# Patient Record
Sex: Female | Born: 1985 | Race: Black or African American | Hispanic: No | Marital: Single | Smoking: Former smoker
Health system: Southern US, Community
[De-identification: ages and names within clinical notes are randomized; demographics above are authoritative.]

## PROBLEM LIST (undated history)

## (undated) DIAGNOSIS — R002 Palpitations: Secondary | ICD-10-CM

## (undated) DIAGNOSIS — D649 Anemia, unspecified: Secondary | ICD-10-CM

## (undated) DIAGNOSIS — I10 Essential (primary) hypertension: Secondary | ICD-10-CM

## (undated) DIAGNOSIS — Z9289 Personal history of other medical treatment: Secondary | ICD-10-CM

## (undated) DIAGNOSIS — J40 Bronchitis, not specified as acute or chronic: Secondary | ICD-10-CM

## (undated) HISTORY — DX: Personal history of other medical treatment: Z92.89

## (undated) HISTORY — DX: Palpitations: R00.2

---

## 2008-07-09 ENCOUNTER — Emergency Department (HOSPITAL_COMMUNITY): Admission: EM | Admit: 2008-07-09 | Discharge: 2008-07-09 | Payer: Self-pay | Admitting: Emergency Medicine

## 2008-10-15 ENCOUNTER — Emergency Department (HOSPITAL_BASED_OUTPATIENT_CLINIC_OR_DEPARTMENT_OTHER): Admission: EM | Admit: 2008-10-15 | Discharge: 2008-10-15 | Payer: Self-pay | Admitting: Emergency Medicine

## 2008-12-11 ENCOUNTER — Emergency Department (HOSPITAL_BASED_OUTPATIENT_CLINIC_OR_DEPARTMENT_OTHER): Admission: EM | Admit: 2008-12-11 | Discharge: 2008-12-11 | Payer: Self-pay | Admitting: Emergency Medicine

## 2008-12-11 ENCOUNTER — Ambulatory Visit: Payer: Self-pay | Admitting: Diagnostic Radiology

## 2009-01-10 ENCOUNTER — Emergency Department (HOSPITAL_BASED_OUTPATIENT_CLINIC_OR_DEPARTMENT_OTHER): Admission: EM | Admit: 2009-01-10 | Discharge: 2009-01-10 | Payer: Self-pay | Admitting: Emergency Medicine

## 2009-03-25 ENCOUNTER — Emergency Department (HOSPITAL_BASED_OUTPATIENT_CLINIC_OR_DEPARTMENT_OTHER): Admission: EM | Admit: 2009-03-25 | Discharge: 2009-03-25 | Payer: Self-pay | Admitting: Emergency Medicine

## 2009-05-11 ENCOUNTER — Emergency Department (HOSPITAL_BASED_OUTPATIENT_CLINIC_OR_DEPARTMENT_OTHER): Admission: EM | Admit: 2009-05-11 | Discharge: 2009-05-11 | Payer: Self-pay | Admitting: Emergency Medicine

## 2009-09-17 ENCOUNTER — Emergency Department (HOSPITAL_BASED_OUTPATIENT_CLINIC_OR_DEPARTMENT_OTHER): Admission: EM | Admit: 2009-09-17 | Discharge: 2009-09-17 | Payer: Self-pay | Admitting: Emergency Medicine

## 2010-08-02 LAB — URINE MICROSCOPIC-ADD ON

## 2010-08-02 LAB — URINALYSIS, ROUTINE W REFLEX MICROSCOPIC
Hgb urine dipstick: NEGATIVE
Nitrite: NEGATIVE
Protein, ur: NEGATIVE mg/dL
Specific Gravity, Urine: 1.029 (ref 1.005–1.030)
Urobilinogen, UA: 0.2 mg/dL (ref 0.0–1.0)

## 2010-08-02 LAB — GC/CHLAMYDIA PROBE AMP, GENITAL: Chlamydia, DNA Probe: NEGATIVE

## 2010-08-02 LAB — WET PREP, GENITAL: Trich, Wet Prep: NONE SEEN

## 2010-08-02 LAB — HERPES SIMPLEX VIRUS CULTURE

## 2010-08-15 LAB — URINE MICROSCOPIC-ADD ON

## 2010-08-15 LAB — URINALYSIS, ROUTINE W REFLEX MICROSCOPIC
Bilirubin Urine: NEGATIVE
Glucose, UA: NEGATIVE mg/dL
Ketones, ur: NEGATIVE mg/dL
Protein, ur: NEGATIVE mg/dL
Urobilinogen, UA: 0.2 mg/dL (ref 0.0–1.0)

## 2010-08-17 LAB — GC/CHLAMYDIA PROBE AMP, GENITAL: Chlamydia, DNA Probe: NEGATIVE

## 2010-08-17 LAB — URINALYSIS, ROUTINE W REFLEX MICROSCOPIC
Bilirubin Urine: NEGATIVE
Nitrite: NEGATIVE
Specific Gravity, Urine: 1.027 (ref 1.005–1.030)
Urobilinogen, UA: 0.2 mg/dL (ref 0.0–1.0)
pH: 5.5 (ref 5.0–8.0)

## 2010-08-17 LAB — URINE CULTURE

## 2010-08-17 LAB — URINE MICROSCOPIC-ADD ON

## 2010-08-17 LAB — WET PREP, GENITAL: Trich, Wet Prep: NONE SEEN

## 2010-08-17 LAB — RPR: RPR Ser Ql: NONREACTIVE

## 2010-08-17 LAB — PREGNANCY, URINE: Preg Test, Ur: NEGATIVE

## 2010-08-20 LAB — DIFFERENTIAL
Basophils Absolute: 0.1 10*3/uL (ref 0.0–0.1)
Eosinophils Absolute: 0.1 10*3/uL (ref 0.0–0.7)
Lymphocytes Relative: 27 % (ref 12–46)
Neutro Abs: 4.5 10*3/uL (ref 1.7–7.7)
Neutrophils Relative %: 65 % (ref 43–77)

## 2010-08-20 LAB — URINALYSIS, ROUTINE W REFLEX MICROSCOPIC
Glucose, UA: NEGATIVE mg/dL
Ketones, ur: NEGATIVE mg/dL
Protein, ur: NEGATIVE mg/dL
Urobilinogen, UA: 0.2 mg/dL (ref 0.0–1.0)

## 2010-08-20 LAB — PREGNANCY, URINE: Preg Test, Ur: NEGATIVE

## 2010-08-20 LAB — URINE CULTURE

## 2010-08-20 LAB — COMPREHENSIVE METABOLIC PANEL
BUN: 13 mg/dL (ref 6–23)
CO2: 24 mEq/L (ref 19–32)
Calcium: 9.3 mg/dL (ref 8.4–10.5)
Chloride: 109 mEq/L (ref 96–112)
Creatinine, Ser: 0.8 mg/dL (ref 0.4–1.2)
GFR calc non Af Amer: >60 mL/min (ref >60)
Total Bilirubin: 0.3 mg/dL (ref 0.3–1.2)

## 2010-08-20 LAB — CBC
HCT: 27.6 % — ABNORMAL LOW (ref 36.0–46.0)
MCHC: 30.1 g/dL (ref 30.0–36.0)
MCV: 62.7 fL — ABNORMAL LOW (ref 78.0–100.0)
RBC: 4.4 MIL/uL (ref 3.87–5.11)
WBC: 7 10*3/uL (ref 4.0–10.5)

## 2010-08-20 LAB — URINE MICROSCOPIC-ADD ON

## 2010-08-20 LAB — D-DIMER, QUANTITATIVE: D-Dimer, Quant: <0.22 ug/mL-FEU (ref 0.00–0.48)

## 2010-08-21 LAB — DIFFERENTIAL
Basophils Absolute: 0 10*3/uL (ref 0.0–0.1)
Basophils Absolute: 0.1 10*3/uL (ref 0.0–0.1)
Basophils Relative: 0 % (ref 0–1)
Basophils Relative: 1 % (ref 0–1)
Eosinophils Relative: 1 % (ref 0–5)
Lymphocytes Relative: 13 % (ref 12–46)
Monocytes Absolute: 0.5 10*3/uL (ref 0.1–1.0)
Monocytes Relative: 4 % (ref 3–12)
Neutro Abs: 10.4 10*3/uL — ABNORMAL HIGH (ref 1.7–7.7)
Neutro Abs: 6.7 10*3/uL (ref 1.7–7.7)

## 2010-08-21 LAB — CBC
HCT: 29.7 % — ABNORMAL LOW (ref 36.0–46.0)
Hemoglobin: 8.6 g/dL — ABNORMAL LOW (ref 12.0–15.0)
MCHC: 30.2 g/dL (ref 30.0–36.0)
MCV: 63.4 fL — ABNORMAL LOW (ref 78.0–100.0)
Platelets: 286 10*3/uL (ref 150–400)
RDW: 15.8 % — ABNORMAL HIGH (ref 11.5–15.5)
RDW: 16 % — ABNORMAL HIGH (ref 11.5–15.5)

## 2010-08-21 LAB — URINALYSIS, ROUTINE W REFLEX MICROSCOPIC
Nitrite: NEGATIVE
Specific Gravity, Urine: 1.024 (ref 1.005–1.030)
Urobilinogen, UA: 0.2 mg/dL (ref 0.0–1.0)
pH: 5.5 (ref 5.0–8.0)

## 2010-08-21 LAB — URINE MICROSCOPIC-ADD ON

## 2010-08-21 LAB — COMPREHENSIVE METABOLIC PANEL
Albumin: 4.1 g/dL (ref 3.5–5.2)
BUN: 13 mg/dL (ref 6–23)
Creatinine, Ser: 0.8 mg/dL (ref 0.4–1.2)
Total Protein: 7.1 g/dL (ref 6.0–8.3)

## 2010-08-21 LAB — URINE CULTURE

## 2010-09-20 ENCOUNTER — Emergency Department (HOSPITAL_BASED_OUTPATIENT_CLINIC_OR_DEPARTMENT_OTHER)
Admission: EM | Admit: 2010-09-20 | Discharge: 2010-09-20 | Disposition: A | Discharge status: Home / Self Care | Payer: Self-pay | Attending: Emergency Medicine | Admitting: Emergency Medicine

## 2010-09-20 DIAGNOSIS — K089 Disorder of teeth and supporting structures, unspecified: Secondary | ICD-10-CM | POA: Insufficient documentation

## 2010-09-20 DIAGNOSIS — F17200 Nicotine dependence, unspecified, uncomplicated: Secondary | ICD-10-CM | POA: Insufficient documentation

## 2010-10-04 ENCOUNTER — Emergency Department (HOSPITAL_BASED_OUTPATIENT_CLINIC_OR_DEPARTMENT_OTHER)
Admission: EM | Admit: 2010-10-04 | Discharge: 2010-10-04 | Disposition: A | Discharge status: Home / Self Care | Payer: Self-pay | Attending: Emergency Medicine | Admitting: Emergency Medicine

## 2010-10-04 DIAGNOSIS — J069 Acute upper respiratory infection, unspecified: Secondary | ICD-10-CM | POA: Insufficient documentation

## 2010-10-04 DIAGNOSIS — F17200 Nicotine dependence, unspecified, uncomplicated: Secondary | ICD-10-CM | POA: Insufficient documentation

## 2010-10-04 DIAGNOSIS — N898 Other specified noninflammatory disorders of vagina: Secondary | ICD-10-CM | POA: Insufficient documentation

## 2010-10-04 DIAGNOSIS — J40 Bronchitis, not specified as acute or chronic: Secondary | ICD-10-CM | POA: Insufficient documentation

## 2010-10-04 LAB — URINALYSIS, ROUTINE W REFLEX MICROSCOPIC
Bilirubin Urine: NEGATIVE
Ketones, ur: NEGATIVE mg/dL
Nitrite: NEGATIVE
Protein, ur: NEGATIVE mg/dL
Specific Gravity, Urine: 1.03 (ref 1.005–1.030)
Urobilinogen, UA: 0.2 mg/dL (ref 0.0–1.0)

## 2010-10-12 ENCOUNTER — Emergency Department (HOSPITAL_BASED_OUTPATIENT_CLINIC_OR_DEPARTMENT_OTHER)
Admission: EM | Admit: 2010-10-12 | Discharge: 2010-10-12 | Disposition: A | Discharge status: Home / Self Care | Payer: Self-pay | Attending: Emergency Medicine | Admitting: Emergency Medicine

## 2010-10-12 DIAGNOSIS — D649 Anemia, unspecified: Secondary | ICD-10-CM | POA: Insufficient documentation

## 2010-10-12 DIAGNOSIS — M7989 Other specified soft tissue disorders: Secondary | ICD-10-CM | POA: Insufficient documentation

## 2010-10-12 LAB — BASIC METABOLIC PANEL
BUN: 9 mg/dL (ref 6–23)
Calcium: 8.7 mg/dL (ref 8.4–10.5)
Creatinine, Ser: 0.6 mg/dL (ref 0.4–1.2)
GFR calc Af Amer: >60 mL/min (ref >60)

## 2010-10-12 LAB — DIFFERENTIAL
Basophils Relative: 1 % (ref 0–1)
Eosinophils Absolute: 0.3 10*3/uL (ref 0.0–0.7)
Lymphs Abs: 2.9 10*3/uL (ref 0.7–4.0)
Monocytes Absolute: 0.6 10*3/uL (ref 0.1–1.0)
Neutro Abs: 4.5 10*3/uL (ref 1.7–7.7)
Neutrophils Relative %: 55 % (ref 43–77)

## 2010-10-12 LAB — CBC
MCH: 21.4 pg — ABNORMAL LOW (ref 26.0–34.0)
MCHC: 30.9 g/dL (ref 30.0–36.0)
Platelets: 326 10*3/uL (ref 150–400)
RDW: 17.2 % — ABNORMAL HIGH (ref 11.5–15.5)

## 2010-10-14 ENCOUNTER — Emergency Department (INDEPENDENT_AMBULATORY_CARE_PROVIDER_SITE_OTHER): Payer: Self-pay

## 2010-10-14 ENCOUNTER — Emergency Department (HOSPITAL_BASED_OUTPATIENT_CLINIC_OR_DEPARTMENT_OTHER)
Admission: EM | Admit: 2010-10-14 | Discharge: 2010-10-14 | Disposition: A | Discharge status: Home / Self Care | Payer: Self-pay | Attending: Emergency Medicine | Admitting: Emergency Medicine

## 2010-10-14 DIAGNOSIS — X58XXXA Exposure to other specified factors, initial encounter: Secondary | ICD-10-CM

## 2010-10-14 DIAGNOSIS — H5789 Other specified disorders of eye and adnexa: Secondary | ICD-10-CM

## 2010-10-14 DIAGNOSIS — H5710 Ocular pain, unspecified eye: Secondary | ICD-10-CM

## 2010-10-14 DIAGNOSIS — S0003XA Contusion of scalp, initial encounter: Secondary | ICD-10-CM | POA: Insufficient documentation

## 2010-12-09 ENCOUNTER — Encounter: Payer: Self-pay | Admitting: *Deleted

## 2010-12-09 ENCOUNTER — Emergency Department (INDEPENDENT_AMBULATORY_CARE_PROVIDER_SITE_OTHER): Payer: Self-pay

## 2010-12-09 ENCOUNTER — Emergency Department (HOSPITAL_BASED_OUTPATIENT_CLINIC_OR_DEPARTMENT_OTHER)
Admission: EM | Admit: 2010-12-09 | Discharge: 2010-12-09 | Disposition: A | Discharge status: Home / Self Care | Payer: Self-pay | Attending: Emergency Medicine | Admitting: Emergency Medicine

## 2010-12-09 DIAGNOSIS — R079 Chest pain, unspecified: Secondary | ICD-10-CM | POA: Insufficient documentation

## 2010-12-09 DIAGNOSIS — R109 Unspecified abdominal pain: Secondary | ICD-10-CM | POA: Insufficient documentation

## 2010-12-09 DIAGNOSIS — N390 Urinary tract infection, site not specified: Secondary | ICD-10-CM | POA: Insufficient documentation

## 2010-12-09 DIAGNOSIS — F17200 Nicotine dependence, unspecified, uncomplicated: Secondary | ICD-10-CM | POA: Insufficient documentation

## 2010-12-09 DIAGNOSIS — M940 Chondrocostal junction syndrome [Tietze]: Secondary | ICD-10-CM | POA: Insufficient documentation

## 2010-12-09 LAB — COMPREHENSIVE METABOLIC PANEL
ALT: 21 U/L (ref 0–35)
Albumin: 3.9 g/dL (ref 3.5–5.2)
Alkaline Phosphatase: 52 U/L (ref 39–117)
Calcium: 9.5 mg/dL (ref 8.4–10.5)
Potassium: 4.1 mEq/L (ref 3.5–5.1)
Sodium: 140 mEq/L (ref 135–145)
Total Protein: 6.4 g/dL (ref 6.0–8.3)

## 2010-12-09 LAB — URINALYSIS, ROUTINE W REFLEX MICROSCOPIC
Glucose, UA: NEGATIVE mg/dL
Ketones, ur: 15 mg/dL — AB
Nitrite: NEGATIVE
Specific Gravity, Urine: 1.035 — ABNORMAL HIGH (ref 1.005–1.030)
pH: 5 (ref 5.0–8.0)

## 2010-12-09 LAB — DIFFERENTIAL
Basophils Relative: 0 % (ref 0–1)
Lymphs Abs: 0.8 10*3/uL (ref 0.7–4.0)
Monocytes Absolute: 0.4 10*3/uL (ref 0.1–1.0)
Monocytes Relative: 5 % (ref 3–12)
Neutro Abs: 6.2 10*3/uL (ref 1.7–7.7)
Neutrophils Relative %: 79 % — ABNORMAL HIGH (ref 43–77)

## 2010-12-09 LAB — LIPASE, BLOOD: Lipase: 11 U/L (ref 11–59)

## 2010-12-09 LAB — URINE MICROSCOPIC-ADD ON

## 2010-12-09 LAB — CBC
HCT: 35.6 % — ABNORMAL LOW (ref 36.0–46.0)
Hemoglobin: 11.3 g/dL — ABNORMAL LOW (ref 12.0–15.0)
MCHC: 31.7 g/dL (ref 30.0–36.0)
RBC: 4.93 MIL/uL (ref 3.87–5.11)

## 2010-12-09 MED ORDER — NAPROXEN 250 MG PO TABS
500.0000 mg | ORAL_TABLET | Freq: Once | ORAL | Status: AC
Start: 2010-12-09 — End: 2010-12-09
  Administered 2010-12-09: 500 mg via ORAL
  Filled 2010-12-09 (×2): qty 2

## 2010-12-09 MED ORDER — NAPROXEN 500 MG PO TABS: 500.0000 mg | ORAL_TABLET | Freq: Two times a day (BID) | ORAL | Status: DC

## 2010-12-09 MED ORDER — NITROFURANTOIN MONOHYD MACRO 100 MG PO CAPS: 100.0000 mg | ORAL_CAPSULE | Freq: Two times a day (BID) | ORAL | Status: AC

## 2010-12-09 NOTE — ED Notes (Signed)
Pt amb to room 6 with quick steady gait in nad.  Pt reports mid chest "tight" x this am, also "I think I have a uti..."  Pt denies any sob or other c/o.

## 2010-12-09 NOTE — ED Provider Notes (Signed)
History     Chief Complaint  Patient presents with  . Chest Pain  . Abdominal Pain   HPI Comments: Presents with multiple complaints. First complaint is substernal chest tightness which began yesterday. The pain has been constant it is pleuritic in nature. It is also worse with palpation. Not worse with exertion. Has no cardiac history history of pulmonary embolus. She is a smoker but denies oral contraceptive use, recent travel, recent surgery. There is mild associated shortness of breath secondary to the pain. Has no associated diaphoresis, nausea, vomiting. She also states she has had generalized abdominal pain for the past several days CT with suprapubic tenderness and urinary symptoms. She describes her urinary symptoms of dysuria, frequency, urgency. Symptoms have not been worse. She is to take an antibiotic for an abscess. No fever, chills.  Patient is a 25 y.o. female presenting with chest pain. The history is provided by the patient. No language interpreter was used.  Chest Pain The chest pain began yesterday. Episode Length: has been constant since onset. Chest pain occurs constantly. The chest pain is worsening. The pain is associated with breathing (and palpation). The severity of the pain is mild. The quality of the pain is described as tightness and pleuritic. The pain does not radiate. Chest pain is worsened by deep breathing. Primary symptoms include shortness of breath and abdominal pain. Pertinent negatives for primary symptoms include no fever, no syncope, no cough, no wheezing, no palpitations, no nausea, no vomiting and no dizziness.  The shortness of breath began yesterday. The shortness of breath is mild.  The abdominal pain is located in the suprapubic region. The abdominal pain does not radiate. The abdominal pain is relieved by nothing.  Pertinent negatives for associated symptoms include no lower extremity edema, no near-syncope, no numbness and no weakness. She tried  nothing for the symptoms. Risk factors include obesity and smoking/tobacco exposure.  Pertinent negatives for past medical history include no DVT, no hyperlipidemia, no hypertension, no MI and no PE.  Pertinent negatives for family medical history include: no early MI in family and no PE in family.     History reviewed. No pertinent past medical history.  History reviewed. No pertinent past surgical history.  History reviewed. No pertinent family history.  History  Substance Use Topics  . Smoking status: Current Everyday Smoker  . Smokeless tobacco: Not on file  . Alcohol Use: No    OB History    Grav Para Term Preterm Abortions TAB SAB Ect Mult Living                  Review of Systems  Constitutional: Negative for fever, chills, activity change and appetite change.  HENT: Negative for congestion, sore throat, rhinorrhea, neck pain and neck stiffness.   Respiratory: Positive for chest tightness and shortness of breath. Negative for cough and wheezing.   Cardiovascular: Positive for chest pain. Negative for palpitations, syncope and near-syncope.  Gastrointestinal: Positive for abdominal pain. Negative for nausea, vomiting, diarrhea and constipation.  Genitourinary: Positive for dysuria, urgency and frequency. Negative for hematuria, flank pain, decreased urine volume, vaginal bleeding, vaginal discharge and difficulty urinating.  Neurological: Negative for dizziness, weakness, numbness and headaches.  All other systems reviewed and are negative.    Physical Exam  BP 126/66  Pulse 106  Temp(Src) 98.9 F (37.2 C) (Oral)  Ht 5\' 9"  (1.753 m)  Wt 290 lb (131.543 kg)  BMI 42.83 kg/m2  SpO2 100%  LMP 11/19/2010  Physical  Exam  Nursing note and vitals reviewed. Constitutional: She is oriented to person, place, and time. She appears well-developed and well-nourished. No distress.  HENT:  Head: Normocephalic and atraumatic.  Mouth/Throat: Oropharynx is clear and moist.    Eyes: Conjunctivae and EOM are normal. Pupils are equal, round, and reactive to light.  Neck: Normal range of motion.  Cardiovascular: Normal rate, regular rhythm, normal heart sounds and intact distal pulses.   No murmur heard. Pulmonary/Chest: Effort normal and breath sounds normal. No respiratory distress. She exhibits tenderness (substernal and lateral aspects chest wall).  Abdominal: Soft. Bowel sounds are normal. She exhibits no distension. There is no tenderness.  Neurological: She is alert and oriented to person, place, and time.  Skin: Skin is warm and dry. No rash noted.    ED Course  Procedures  MDM 1. Costochondritis   Date: 12/09/2010  Rate: 102  Rhythm: sinus tachycardia  QRS Axis: normal  Intervals: normal  ST/T Wave abnormalities: nonspecific T wave changes  Conduction Disutrbances:none  Narrative Interpretation:   Old EKG Reviewed: unchanged  EKG performed and negative. Chest x-ray was performed and reviewed was negative.  performed a d-dimer to rule out pulmonary embolism since the patient was tachycardic on arrival although I had low suspicion for this clinically. The patient's pain is reproducible and pleuritic which is consistent with costochondritis. Will treat her pain as an outpatient with Naprosyn provided instructions to follow up with her primary care physician. She is instructed to return for worsening symptoms.  Patient / Family / Caregiver informed of clinical course, understand medical decision-making process, and agree with plan.  Pt feels improved after observation and/or treatment in ED.  2. UTI  The patient's urine is consistent with urinary tract infection. I will treat her with Macrobid.     Dayton Bailiff, MD 12/09/10 1623

## 2011-06-27 ENCOUNTER — Emergency Department (HOSPITAL_BASED_OUTPATIENT_CLINIC_OR_DEPARTMENT_OTHER)
Admission: EM | Admit: 2011-06-27 | Discharge: 2011-06-27 | Disposition: A | Discharge status: Home / Self Care | Payer: Self-pay | Attending: Emergency Medicine | Admitting: Emergency Medicine

## 2011-06-27 ENCOUNTER — Encounter (HOSPITAL_BASED_OUTPATIENT_CLINIC_OR_DEPARTMENT_OTHER): Payer: Self-pay | Admitting: Family Medicine

## 2011-06-27 DIAGNOSIS — R300 Dysuria: Secondary | ICD-10-CM | POA: Insufficient documentation

## 2011-06-27 DIAGNOSIS — F17200 Nicotine dependence, unspecified, uncomplicated: Secondary | ICD-10-CM | POA: Insufficient documentation

## 2011-06-27 DIAGNOSIS — R109 Unspecified abdominal pain: Secondary | ICD-10-CM | POA: Insufficient documentation

## 2011-06-27 DIAGNOSIS — D649 Anemia, unspecified: Secondary | ICD-10-CM | POA: Insufficient documentation

## 2011-06-27 HISTORY — DX: Anemia, unspecified: D64.9

## 2011-06-27 LAB — DIFFERENTIAL
Eosinophils Absolute: 0.2 10*3/uL (ref 0.0–0.7)
Eosinophils Relative: 4 % (ref 0–5)
Lymphocytes Relative: 48 % — ABNORMAL HIGH (ref 12–46)
Lymphs Abs: 2.1 10*3/uL (ref 0.7–4.0)
Monocytes Absolute: 0.3 10*3/uL (ref 0.1–1.0)
Monocytes Relative: 7 % (ref 3–12)

## 2011-06-27 LAB — URINALYSIS, ROUTINE W REFLEX MICROSCOPIC
Bilirubin Urine: NEGATIVE
Glucose, UA: NEGATIVE mg/dL
Hgb urine dipstick: NEGATIVE
Ketones, ur: NEGATIVE mg/dL
Protein, ur: NEGATIVE mg/dL
Urobilinogen, UA: 0.2 mg/dL (ref 0.0–1.0)

## 2011-06-27 LAB — CBC
HCT: 32.3 % — ABNORMAL LOW (ref 36.0–46.0)
Hemoglobin: 10 g/dL — ABNORMAL LOW (ref 12.0–15.0)
MCH: 21.9 pg — ABNORMAL LOW (ref 26.0–34.0)
MCV: 70.8 fL — ABNORMAL LOW (ref 78.0–100.0)
Platelets: 277 10*3/uL (ref 150–400)
RBC: 4.56 MIL/uL (ref 3.87–5.11)
WBC: 4.3 10*3/uL (ref 4.0–10.5)

## 2011-06-27 LAB — URINE MICROSCOPIC-ADD ON

## 2011-06-27 MED ORDER — NITROFURANTOIN MONOHYD MACRO 100 MG PO CAPS: 100.0000 mg | ORAL_CAPSULE | Freq: Two times a day (BID) | ORAL | Status: AC

## 2011-06-27 MED ORDER — NAPROXEN 500 MG PO TABS: 500.0000 mg | ORAL_TABLET | Freq: Two times a day (BID) | ORAL | Status: AC

## 2011-06-27 NOTE — ED Provider Notes (Signed)
Medical screening examination/treatment/procedure(s) were performed by non-physician practitioner and as supervising physician I was immediately available for consultation/collaboration.   Jayton Popelka, MD 06/27/11 1547 

## 2011-06-27 NOTE — ED Notes (Signed)
Karen Sofia, PA-C at bedside for evaluation. 

## 2011-06-27 NOTE — ED Notes (Signed)
Pt informed of plan of care. 

## 2011-06-27 NOTE — ED Notes (Addendum)
Pt c/o dysuria since Sunday. Pt sts she has also "felt tired for past month". Pt able to perform normal tasks and normal appetite. Pt reports h/o anemia and sts she has not been taking iron supplements.

## 2011-06-27 NOTE — ED Provider Notes (Signed)
History     CSN: [id number]  Arrival date & time 06/27/11  1011   First MD Initiated Contact with Patient 06/27/11 1228      Chief Complaint  Patient presents with  . Dysuria    (Consider location/radiation/quality/duration/timing/severity/associated sxs/prior treatment) Patient is a 26 y.o. female presenting with dysuria. The history is provided by the patient. No language interpreter was used.  Dysuria  This is a new problem. The current episode started more than 2 days ago. The problem occurs every urination. The problem has been gradually worsening. The quality of the pain is described as burning. The pain is at a severity of 5/10. The pain is moderate. There has been no fever. She is sexually active. Associated symptoms include sweats and flank pain. She has tried nothing for the symptoms. Her past medical history does not include kidney stones.  Pt complains of burning with urination.  Pt denies std risk.  Pt had check up 3 weeks ago with negative cultures  Past Medical History  Diagnosis Date  . Anemia     History reviewed. No pertinent past surgical history.  No family history on file.  History  Substance Use Topics  . Smoking status: Current Everyday Smoker  . Smokeless tobacco: Not on file  . Alcohol Use: No    OB History    Grav Para Term Preterm Abortions TAB SAB Ect Mult Living                  Review of Systems  Genitourinary: Positive for dysuria and flank pain.  All other systems reviewed and are negative.    Allergies  Augmentin; Keflex; and Septra  Home Medications   Current Outpatient Rx  Name Route Sig Dispense Refill  . IBUPROFEN 200 MG PO TABS Oral Take 400 mg by mouth every 8 (eight) hours. For pain      . IRON COMPLEX PO Oral Take 1 tablet by mouth daily.      . MULTIVITAMIN PO Oral Take 1 tablet by mouth daily.      Marland Kitchen NAPROXEN 500 MG PO TABS Oral Take 1 tablet (500 mg total) by mouth 2 (two) times daily. 30 tablet 0    BP 155/85   Pulse 78  Temp(Src) 98.6 F (37 C) (Oral)  Resp 20  Ht 5\' 9"  (1.753 m)  Wt 280 lb (127.007 kg)  BMI 41.35 kg/m2  SpO2 100%  Physical Exam  Nursing note and vitals reviewed. Constitutional: She appears well-developed and well-nourished.  HENT:  Head: Normocephalic.  Eyes: Pupils are equal, round, and reactive to light.  Neck: Normal range of motion.  Cardiovascular: Normal rate and normal heart sounds.   Pulmonary/Chest: Effort normal and breath sounds normal.  Abdominal: Soft. Bowel sounds are normal.  Musculoskeletal: Normal range of motion.  Neurological: She is alert.  Skin: Skin is warm.  Psychiatric: She has a normal mood and affect.    ED Course  Procedures (including critical care time)  Labs Reviewed  URINALYSIS, ROUTINE W REFLEX MICROSCOPIC - Abnormal; Notable for the following:    APPearance CLOUDY (*)    Specific Gravity, Urine 1.031 (*)    Leukocytes, UA MODERATE (*)    All other components within normal limits  URINE MICROSCOPIC-ADD ON - Abnormal; Notable for the following:    Bacteria, UA FEW (*)    All other components within normal limits  PREGNANCY, URINE   No results found.   No diagnosis found.    MDM  Results for orders placed during the hospital encounter of 06/27/11  PREGNANCY, URINE      Component Value Range   Preg Test, Ur NEGATIVE  NEGATIVE   URINALYSIS, ROUTINE W REFLEX MICROSCOPIC      Component Value Range   Color, Urine YELLOW  YELLOW    APPearance CLOUDY (*) CLEAR    Specific Gravity, Urine 1.031 (*) 1.005 - 1.030    pH 5.5  5.0 - 8.0    Glucose, UA NEGATIVE  NEGATIVE (mg/dL)   Hgb urine dipstick NEGATIVE  NEGATIVE    Bilirubin Urine NEGATIVE  NEGATIVE    Ketones, ur NEGATIVE  NEGATIVE (mg/dL)   Protein, ur NEGATIVE  NEGATIVE (mg/dL)   Urobilinogen, UA 0.2  0.0 - 1.0 (mg/dL)   Nitrite NEGATIVE  NEGATIVE    Leukocytes, UA MODERATE (*) NEGATIVE   URINE MICROSCOPIC-ADD ON      Component Value Range   Squamous Epithelial  / LPF RARE  RARE    WBC, UA 0-2  <3 (WBC/hpf)   Bacteria, UA FEW (*) RARE   CBC      Component Value Range   WBC 4.3  4.0 - 10.5 (K/uL)   RBC 4.56  3.87 - 5.11 (MIL/uL)   Hemoglobin 10.0 (*) 12.0 - 15.0 (g/dL)   HCT 78.4 (*) 69.6 - 46.0 (%)   MCV 70.8 (*) 78.0 - 100.0 (fL)   MCH 21.9 (*) 26.0 - 34.0 (pg)   MCHC 31.0  30.0 - 36.0 (g/dL)   RDW 29.5  28.4 - 13.2 (%)   Platelets 277  150 - 400 (K/uL)  DIFFERENTIAL      Component Value Range   Neutrophils Relative 40 (*) 43 - 77 (%)   Neutro Abs 1.7  1.7 - 7.7 (K/uL)   Lymphocytes Relative 48 (*) 12 - 46 (%)   Lymphs Abs 2.1  0.7 - 4.0 (K/uL)   Monocytes Relative 7  3 - 12 (%)   Monocytes Absolute 0.3  0.1 - 1.0 (K/uL)   Eosinophils Relative 4  0 - 5 (%)   Eosinophils Absolute 0.2  0.0 - 0.7 (K/uL)   Basophils Relative 1  0 - 1 (%)   Basophils Absolute 0.0  0.0 - 0.1 (K/uL)   No results found.  Pt complains of feeling like she is anemic.  Pt advised to to take iron.  Pt advised I will treat with macrobid      Langston Masker, [place] 06/27/11 1355

## 2011-06-27 NOTE — Discharge Instructions (Signed)
Dysuria Dysuria is the medical term for pain with urination. There are many causes for dysuria, but urinary tract infection is the most common. If a urinalysis was performed it can show that there is a urinary tract infection. A urine culture confirms that you or your child is sick. You will need to follow up with a healthcare provider because:  If a urine culture was done you will need to know the culture results and treatment recommendations.   If the urine culture was positive, you or your child will need to be put on antibiotics or know if the antibiotics prescribed are the right antibiotics for your urinary tract infection.   If the urine culture is negative (no urinary tract infection), then other causes may need to be explored or antibiotics need to be stopped.  Today laboratory work may have been done and there does not seem to be an infection. If cultures were done they will take at least 24 to 48 hours to be completed. Today x-rays may have been taken and they read as normal. No cause can be found for the problems. The x-rays may be re-read by a radiologist and you will be contacted if additional findings are made. You or your child may have been put on medications to help with this problem until you can see your primary caregiver. If the problems get better, see your primary caregiver if the problems return. If you were given antibiotics (medications which kill germs), take all of the mediations as directed for the full course of treatment.  If laboratory work was done, you need to find the results. Leave a telephone number where you can be reached. If this is not possible, make sure you find out how you are to get test results. HOME CARE INSTRUCTIONS   Drink lots of fluids. For adults, drink eight, 8 ounce glasses of clear juice or water a day. For children, replace fluids as suggested by your caregiver.   Empty the bladder often. Avoid holding urine for long periods of time.   After a  bowel movement, women should cleanse front to back, using each tissue only once.   Empty your bladder before and after sexual intercourse.   Take all the medicine given to you until it is gone. You may feel better in a few days, but TAKE ALL MEDICINE.   Avoid caffeine, tea, alcohol and carbonated beverages, because they tend to irritate the bladder.   In men, alcohol may irritate the prostate.   Only take over-the-counter or prescription medicines for pain, discomfort, or fever as directed by your caregiver.   If your caregiver has given you a follow-up appointment, it is very important to keep that appointment. Not keeping the appointment could result in a chronic or permanent injury, pain, and disability. If there is any problem keeping the appointment, you must call back to this facility for assistance.  SEEK IMMEDIATE MEDICAL CARE IF:   Back pain develops.   A fever develops.   There is nausea (feeling sick to your stomach) or vomiting (throwing up).   Problems are no better with medications or are getting worse.  MAKE SURE YOU:   Understand these instructions.   Will watch your condition.   Will get help right away if you are not doing well or get worse.  Document Released: 01/28/2004 Document Revised: 01/11/2011 Document Reviewed: 12/05/2007 [facility] Patient Information 2012 [address] [place], [place].Iron Deficiency Anemia There are many types of anemia. Iron deficiency anemia is the most  common. Iron deficiency anemia is a decrease in the number of red blood cells caused by too little iron. Without enough iron, your body does not produce enough hemoglobin. Hemoglobin is a substance in red blood cells that carries oxygen to the body's tissues. Iron deficiency anemia may leave you tired and short of breath. CAUSES   Lack of iron in the diet.   This may be seen in infants and children, because there is little iron in milk.   This may be seen in adults who do not eat enough iron-rich  foods.   This may be seen in pregnant or breastfeeding women who do not take iron supplements. There is a much higher need for iron intake at these times.   Poor absorption of iron, as seen with intestinal disorders.   Intestinal bleeding.   Heavy periods.  SYMPTOMS  Mild anemia may not be noticeable. Symptoms may include:  Fatigue.   Headache.   Pale skin.   Weakness.   Shortness of breath.   Dizziness.   Cold hands and feet.   Fast or irregular heartbeat.  DIAGNOSIS  Diagnosis requires a thorough evaluation and physical exam by your caregiver.  Blood tests are generally used to confirm iron deficiency anemia.   Additional tests may be done to find the underlying cause of your anemia. These may include:   Testing for blood in the stool (fecal occult blood test).   A procedure to see inside the colon and rectum (colonoscopy).   A procedure to see inside the esophagus and stomach (endoscopy).  TREATMENT   Correcting the cause of the iron deficiency is the first step.   Medicines, such as oral contraceptives, can make heavy menstrual flows lighter.   Antibiotics and other medicines can be used to treat peptic ulcers.   Surgery may be needed to remove a bleeding polyp, tumor, or fibroid.   Often, iron supplements (ferrous sulfate) are taken.   For the best iron absorption, take these supplements with an empty stomach.   You may need to take the supplements with food if you cannot tolerate them on an empty stomach. Vitamin C improves the absorption of iron. Your caregiver may recommend taking your iron tablets with a glass of orange juice or vitamin C supplement.   Milk and antacids should not be taken at the same time as iron supplements. They may interfere with the absorption of iron.   Iron supplements can cause constipation. A stool softener is often recommended.   Pregnant and breastfeeding women will need to take extra iron, because their normal diet  usually will not provide the required amount.   Patients who cannot tolerate iron by mouth can take it through a vein (intravenously) or by an injection into the muscle.  HOME CARE INSTRUCTIONS   Ask your dietitian for help with diet questions.   Take iron and vitamins as directed by your caregiver.   Eat a diet rich in iron. Eat liver, lean beef, whole-grain bread, eggs, dried fruit, and dark green leafy vegetables.  SEEK IMMEDIATE MEDICAL CARE IF:   You have a fainting episode. Do not drive yourself. Call your local emergency services (911 in [place] if no other help is available.   You have chest pain, nausea, or vomiting.   You develop severe or increased shortness of breath with activities.   You develop weakness or increased thirst.   You have a rapid heartbeat.   You develop unexplained sweating or become lightheaded when getting  up from a chair or bed.  MAKE SURE YOU:   Understand these instructions.   Will watch your condition.   Will get help right away if you are not doing well or get worse.  Document Released: 04/28/2000 Document Revised: 01/11/2011 Document Reviewed: 09/07/2009 [facility] Patient Information 2012 [place], [place].

## 2011-06-27 NOTE — ED Notes (Signed)
Snack and PO fluids provided.

## 2011-08-23 ENCOUNTER — Emergency Department (HOSPITAL_BASED_OUTPATIENT_CLINIC_OR_DEPARTMENT_OTHER)
Admission: EM | Admit: 2011-08-23 | Discharge: 2011-08-23 | Disposition: A | Discharge status: Home / Self Care | Payer: Self-pay | Attending: Emergency Medicine | Admitting: Emergency Medicine

## 2011-08-23 ENCOUNTER — Encounter (HOSPITAL_BASED_OUTPATIENT_CLINIC_OR_DEPARTMENT_OTHER): Payer: Self-pay

## 2011-08-23 DIAGNOSIS — R110 Nausea: Secondary | ICD-10-CM | POA: Insufficient documentation

## 2011-08-23 DIAGNOSIS — M549 Dorsalgia, unspecified: Secondary | ICD-10-CM | POA: Insufficient documentation

## 2011-08-23 DIAGNOSIS — N390 Urinary tract infection, site not specified: Secondary | ICD-10-CM | POA: Insufficient documentation

## 2011-08-23 DIAGNOSIS — E669 Obesity, unspecified: Secondary | ICD-10-CM | POA: Insufficient documentation

## 2011-08-23 HISTORY — DX: Bronchitis, not specified as acute or chronic: J40

## 2011-08-23 LAB — URINE MICROSCOPIC-ADD ON

## 2011-08-23 LAB — COMPREHENSIVE METABOLIC PANEL
AST: 18 U/L (ref 0–37)
Albumin: 4 g/dL (ref 3.5–5.2)
Calcium: 8.8 mg/dL (ref 8.4–10.5)
Creatinine, Ser: 0.7 mg/dL (ref 0.50–1.10)
Sodium: 140 mEq/L (ref 135–145)
Total Protein: 6.5 g/dL (ref 6.0–8.3)

## 2011-08-23 LAB — URINALYSIS, ROUTINE W REFLEX MICROSCOPIC
Bilirubin Urine: NEGATIVE
Glucose, UA: NEGATIVE mg/dL
Ketones, ur: NEGATIVE mg/dL
Nitrite: NEGATIVE
Specific Gravity, Urine: 1.035 — ABNORMAL HIGH (ref 1.005–1.030)
pH: 5.5 (ref 5.0–8.0)

## 2011-08-23 LAB — DIFFERENTIAL
Basophils Absolute: 0 10*3/uL (ref 0.0–0.1)
Lymphs Abs: 1.1 10*3/uL (ref 0.7–4.0)
Monocytes Absolute: 0.3 10*3/uL (ref 0.1–1.0)
Neutrophils Relative %: 54 % (ref 43–77)

## 2011-08-23 LAB — CBC
MCH: 21.8 pg — ABNORMAL LOW (ref 26.0–34.0)
MCV: 71.3 fL — ABNORMAL LOW (ref 78.0–100.0)
Platelets: 226 10*3/uL (ref 150–400)
RDW: 18.6 % — ABNORMAL HIGH (ref 11.5–15.5)
WBC: 3.4 10*3/uL — ABNORMAL LOW (ref 4.0–10.5)

## 2011-08-23 LAB — PREGNANCY, URINE: Preg Test, Ur: NEGATIVE

## 2011-08-23 MED ORDER — GI COCKTAIL ~~LOC~~
30.0000 mL | Freq: Once | ORAL | Status: AC
  Administered 2011-08-23: 30 mL via ORAL
  Filled 2011-08-23: qty 30

## 2011-08-23 MED ORDER — OMEPRAZOLE 20 MG PO CPDR: 20.0000 mg | DELAYED_RELEASE_CAPSULE | Freq: Every day | ORAL | Status: DC

## 2011-08-23 MED ORDER — NITROFURANTOIN MONOHYD MACRO 100 MG PO CAPS: 100.0000 mg | ORAL_CAPSULE | Freq: Two times a day (BID) | ORAL | Status: AC

## 2011-08-23 MED ORDER — ONDANSETRON 8 MG PO TBDP
8.0000 mg | ORAL_TABLET | Freq: Once | ORAL | Status: AC
  Administered 2011-08-23: 8 mg via ORAL
  Filled 2011-08-23: qty 1

## 2011-08-23 NOTE — Discharge Instructions (Signed)
Urinary Tract Infection Infections of the urinary tract can start in several places. A bladder infection (cystitis), a kidney infection (pyelonephritis), and a prostate infection (prostatitis) are different types of urinary tract infections (UTIs). They usually get better if treated with medicines (antibiotics) that kill germs. Take all the medicine until it is gone. You or your child may feel better in a few days, but TAKE ALL MEDICINE or the infection may not respond and may become more difficult to treat. HOME CARE INSTRUCTIONS   Drink enough water and fluids to keep the urine clear or pale yellow. Cranberry juice is especially recommended, in addition to large amounts of water.   Avoid caffeine, tea, and carbonated beverages. They tend to irritate the bladder.   Alcohol may irritate the prostate.   Only take over-the-counter or prescription medicines for pain, discomfort, or fever as directed by your caregiver.  To prevent further infections:  Empty the bladder often. Avoid holding urine for long periods of time.   After a bowel movement, women should cleanse from front to back. Use each tissue only once.   Empty the bladder before and after sexual intercourse.  FINDING OUT THE RESULTS OF YOUR TEST Not all test results are available during your visit. If your or your child's test results are not back during the visit, make an appointment with your caregiver to find out the results. Do not assume everything is normal if you have not heard from your caregiver or the medical facility. It is important for you to follow up on all test results. SEEK MEDICAL CARE IF:   There is back pain.   Your baby is older than 3 months with a rectal temperature of 100.5 F (38.1 C) or higher for more than 1 day.   Your or your child's problems (symptoms) are no better in 3 days. Return sooner if you or your child is getting worse.  SEEK IMMEDIATE MEDICAL CARE IF:   There is severe back pain or lower  abdominal pain.   You or your child develops chills.   You have a fever.   Your baby is older than 3 months with a rectal temperature of 102 F (38.9 C) or higher.   Your baby is 23 months old or younger with a rectal temperature of 100.4 F (38 C) or higher.   There is nausea or vomiting.   There is continued burning or discomfort with urination.  MAKE SURE YOU:   Understand these instructions.   Will watch your condition.   Will get help right away if you are not doing well or get worse.  Document Released: 02/08/2005 Document Revised: 04/20/2011 Document Reviewed: 09/13/2006 [facility] Patient Information 2012 [place], [place].  Peptic Ulcers Ulcers are small, open craters or sores that develop in the lining of the stomach or the duodenum (the first part of the small intestine). The term peptic ulcer is used to describe both types of ulcers. There are a number of treatments that relieve the discomfort of ulcers. In most cases ulcers do heal.  CAUSES AND COMMON FEATURES OF PEPTIC ULCERS  Peptic ulcers occur only in areas of the digestive system that come in contact with digestive juices. These juices are secreted (given off) by the stomach. They include acid and an enzyme called pepsin that breaks down proteins. Many people with duodenal ulcers have too much digestive juice spilling down from the stomach. Most people with gastric (stomach) ulcers have normal or below normal amounts of stomach acid. Sometimes,  when the mucous membrane (protective lining) of the stomach and duodenum does not protect well, this may add to the growth of ulcers. Duodenal ulcers often produces pain in a small area between the breastbone and navel. Pain varies from hunger pain to constant gnawing or burning sensations (feeling). Sometimes the pain is felt during sleep and may awaken the person in the middle of the night. Often the pain occurs two or three hours after eating, when the stomach is empty. Other  common symptoms (problems) include overeating for pain relief. Eating relieves the pain of a duodenal ulcer. Gastric ulcer pain may be felt in the same place as the pain of a duodenal ulcer, or slightly higher up. There may also be sensations of feeling full, indigestion, and heartburn. Sometimes pain occurs when the stomach is full. This causes loss of appetite followed by weight loss. HOME CARE INSTRUCTIONS   Use of tobacco products have been found to slow down the healing of an ulcer. STOP SMOKING.   Avoid alcohol, aspirin, and other inflammation (swelling and soreness) reducing drugs. These substances weaken the stomach lining.   Eat regular, nutritious meals.   Avoid foods that bother you.   Take medications and antacids as directed. Over-the-counter medications are used to neutralize stomach acid. Prescription medications reduce acid secretion, block acid production, or provide a protective coating over the ulcer. If a specific antacid was prescribed, do not switch brands without your caregiver's approval.  Surgery is usually not necessary. Diet and/or drug therapy usually is effective. Surgery may be necessary if perforation, obstruction due to scarring, or uncontrollable bleeding is found, or if severe pain is not otherwise controlled. SEEK IMMEDIATE MEDICAL CARE IF:  You see signs of bleeding. This includes vomiting fresh, bright red blood or passing bloody or tarry, black stools.   You suffer weakness, fatigue, or loss of consciousness. These symptoms can result from severe hemorrhaging (bleeding). Shock may result.   You have sudden, intense, severe abdominal (belly) pain. This is the first sign of a perforation. This would require immediate surgical treatment.   You have intense pain and continued vomiting. This could signal an obstruction of the digestive tract.  Document Released: 04/28/2000 Document Revised: 04/20/2011 Document Reviewed: 04/27/2008 [facility] Patient Information  2012 [place], [place].

## 2011-08-23 NOTE — ED Notes (Signed)
Pt reports generalized weakness, fatigue, nausea and low back pain x 3 weeks.

## 2011-08-23 NOTE — ED Provider Notes (Addendum)
History     CSN: [id number]  Arrival date & time 08/23/11  1049   First MD Initiated Contact with Patient 08/23/11 1159      Chief Complaint  Patient presents with  . Fatigue  . Nausea  . Back Pain    (Consider location/radiation/quality/duration/timing/severity/associated sxs/prior treatment) HPI Comments: Patient presents complaining of epigastric abdominal pain and nausea vomiting that has been ongoing for the last several weeks to months.  She notes that she had seen a doctor at [facility] for this and did have an evaluation to include an ultrasound and a CAT scan which she was told was negative.  She states she was not told that she had a history of gallstones.  She states she was given pain meds and nausea meds but her symptoms have been persistent.  She denies fevers.  No diarrhea or melena.  Patient has never been formally evaluated for peptic ulcer disease.  Patient does not have a primary care physician.  Patient noted that last night she felt somewhat lightheaded at work and that she might pass out and that's why she comes in today.  She does note a history of anemia with irregular menses.  She states her periods are normally longer and occur approximately twice per month.  She states the health department had previously put her on iron which she is not currently taking.  Patient notes generalized fatigue as well.  She also notes some low back pain for the last few days but no specific dysuria or vaginal discharge.  Patient is a 26 y.o. female presenting with back pain. The history is provided by the patient. No language interpreter was used.  Back Pain  This is a new problem. The current episode started more than 2 days ago. The problem occurs constantly. The problem has not changed since onset.The pain is associated with no known injury. The pain is present in the lumbar spine. The quality of the pain is described as aching. The pain does not radiate. The pain is mild.  Associated symptoms include abdominal pain. Pertinent negatives include no chest pain, no fever, no headaches and no dysuria.    Past Medical History  Diagnosis Date  . Anemia   . Bronchitis     History reviewed. No pertinent past surgical history.  History reviewed. No pertinent family history.  History  Substance Use Topics  . Smoking status: Current Everyday Smoker -- 0.5 packs/day  . Smokeless tobacco: Never Used  . Alcohol Use: Yes     occasional    OB History    Grav Para Term Preterm Abortions TAB SAB Ect Mult Living                  Review of Systems  Constitutional: Positive for fatigue. Negative for fever and chills.  HENT: Negative.   Eyes: Negative.  Negative for discharge and redness.  Respiratory: Negative.  Negative for cough and shortness of breath.   Cardiovascular: Negative.  Negative for chest pain.  Gastrointestinal: Positive for nausea, vomiting and abdominal pain. Negative for diarrhea.  Genitourinary: Negative.  Negative for dysuria and vaginal discharge.  Musculoskeletal: Positive for back pain.  Skin: Negative.  Negative for color change and rash.  Neurological: Positive for light-headedness. Negative for syncope and headaches.  Hematological: Negative.  Negative for adenopathy.  Psychiatric/Behavioral: Negative.  Negative for confusion.  All other systems reviewed and are negative.    Allergies  Doxycycline; Augmentin; Keflex; and Septra  Home Medications  Current Outpatient Rx  Name Route Sig Dispense Refill  . IBUPROFEN 200 MG PO TABS Oral Take 400 mg by mouth every 8 (eight) hours. For pain      . IRON COMPLEX PO Oral Take 1 tablet by mouth daily.      . MULTIVITAMIN PO Oral Take 1 tablet by mouth daily.      Marland Kitchen NAPROXEN 500 MG PO TABS Oral Take 1 tablet (500 mg total) by mouth 2 (two) times daily. 30 tablet 0    BP 157/83  Pulse 79  Temp(Src) 98.6 F (37 C) (Oral)  Resp 18  Ht 5\' 9"  (1.753 m)  Wt 280 lb (127.007 kg)  BMI  41.35 kg/m2  SpO2 98%  LMP 08/02/2011  Physical Exam  Nursing note and vitals reviewed. Constitutional: She is oriented to person, place, and time. She appears well-developed and well-nourished.  Non-toxic appearance. She does not have a sickly appearance.       Obese female sitting on the bed  HENT:  Head: Normocephalic and atraumatic.  Eyes: Conjunctivae, EOM and lids are normal. Pupils are equal, round, and reactive to light. No scleral icterus.  Neck: Trachea normal and normal range of motion. Neck supple.  Cardiovascular: Normal rate, regular rhythm and normal heart sounds.   Pulmonary/Chest: Effort normal and breath sounds normal. No respiratory distress. She has no wheezes. She has no rales.  Abdominal: Soft. Normal appearance. She exhibits no distension. There is tenderness. There is no rebound, no guarding and no CVA tenderness.       Mild epigastric tenderness but no rebound or guarding  Musculoskeletal: Normal range of motion.  Neurological: She is alert and oriented to person, place, and time. She has normal strength.  Skin: Skin is warm, dry and intact. No rash noted.  Psychiatric: She has a normal mood and affect. Her behavior is normal. Judgment and thought content normal.    ED Course  Procedures (including critical care time)  Results for orders placed during the hospital encounter of 08/23/11  PREGNANCY, URINE      Component Value Range   Preg Test, Ur NEGATIVE  NEGATIVE   URINALYSIS, ROUTINE W REFLEX MICROSCOPIC      Component Value Range   Color, Urine YELLOW  YELLOW    APPearance CLOUDY (*) CLEAR    Specific Gravity, Urine 1.035 (*) 1.005 - 1.030    pH 5.5  5.0 - 8.0    Glucose, UA NEGATIVE  NEGATIVE (mg/dL)   Hgb urine dipstick NEGATIVE  NEGATIVE    Bilirubin Urine NEGATIVE  NEGATIVE    Ketones, ur NEGATIVE  NEGATIVE (mg/dL)   Protein, ur NEGATIVE  NEGATIVE (mg/dL)   Urobilinogen, UA 0.2  0.0 - 1.0 (mg/dL)   Nitrite NEGATIVE  NEGATIVE    Leukocytes, UA  MODERATE (*) NEGATIVE   URINE MICROSCOPIC-ADD ON      Component Value Range   Squamous Epithelial / LPF FEW (*) RARE    WBC, UA 3-6  <3 (WBC/hpf)   RBC / HPF 0-2  <3 (RBC/hpf)   Bacteria, UA MANY (*) RARE    Urine-Other MUCOUS PRESENT    CBC      Component Value Range   WBC 3.4 (*) 4.0 - 10.5 (K/uL)   RBC 4.95  3.87 - 5.11 (MIL/uL)   Hemoglobin 10.8 (*) 12.0 - 15.0 (g/dL)   HCT 96.2 (*) 95.2 - 46.0 (%)   MCV 71.3 (*) 78.0 - 100.0 (fL)   MCH 21.8 (*) 26.0 -  34.0 (pg)   MCHC 30.6  30.0 - 36.0 (g/dL)   RDW 40.9 (*) 81.1 - 15.5 (%)   Platelets 226  150 - 400 (K/uL)  DIFFERENTIAL      Component Value Range   Neutrophils Relative 54  43 - 77 (%)   Lymphocytes Relative 31  12 - 46 (%)   Monocytes Relative 10  3 - 12 (%)   Eosinophils Relative 5  0 - 5 (%)   Basophils Relative 0  0 - 1 (%)   Neutro Abs 1.8  1.7 - 7.7 (K/uL)   Lymphs Abs 1.1  0.7 - 4.0 (K/uL)   Monocytes Absolute 0.3  0.1 - 1.0 (K/uL)   Eosinophils Absolute 0.2  0.0 - 0.7 (K/uL)   Basophils Absolute 0.0  0.0 - 0.1 (K/uL)   WBC Morphology WHITE COUNT CONFIRMED ON SMEAR    COMPREHENSIVE METABOLIC PANEL      Component Value Range   Sodium 140  135 - 145 (mEq/L)   Potassium 3.9  3.5 - 5.1 (mEq/L)   Chloride 108  96 - 112 (mEq/L)   CO2 22  19 - 32 (mEq/L)   Glucose, Bld 91  70 - 99 (mg/dL)   BUN 11  6 - 23 (mg/dL)   Creatinine, Ser 9.14  0.50 - 1.10 (mg/dL)   Calcium 8.8  8.4 - 78.2 (mg/dL)   Total Protein 6.5  6.0 - 8.3 (g/dL)   Albumin 4.0  3.5 - 5.2 (g/dL)   AST 18  0 - 37 (U/L)   ALT 20  0 - 35 (U/L)   Alkaline Phosphatase 50  39 - 117 (U/L)   Total Bilirubin 0.3  0.3 - 1.2 (mg/dL)   GFR calc non Af Amer >90  >90 (mL/min)   GFR calc Af Amer >90  >90 (mL/min)  LIPASE, BLOOD      Component Value Range   Lipase 18  11 - 59 (U/L)       MDM  Patient with epigastric pain with nausea and vomiting.  She can verbalize to me that she's had prior workups for gallstones and pancreatitis with CAT scans and  ultrasounds side do not feel the need with a benign abdominal exam today to pursue further imaging.  Patient has normal vital signs.  This is been an ongoing process for several weeks as well.  I will check her CMP and lipase here and treat her for possible peptic ulcer disease versus GERD.  Patient has low back pain that may correlate with a UTI as well.        Nat Christen, MD 08/23/11 1216  Patient's laboratory studies are normal at this time.  I will treat her for UTI with nitrofurantoin and begin her on a PPI.  The patient was instructed to followup with a primary care physician.  Nat Christen, MD 08/23/11 1320

## 2011-12-13 ENCOUNTER — Encounter (HOSPITAL_BASED_OUTPATIENT_CLINIC_OR_DEPARTMENT_OTHER): Payer: Self-pay

## 2011-12-13 ENCOUNTER — Emergency Department (HOSPITAL_BASED_OUTPATIENT_CLINIC_OR_DEPARTMENT_OTHER)
Admission: EM | Admit: 2011-12-13 | Discharge: 2011-12-13 | Disposition: A | Discharge status: Home / Self Care | Payer: Self-pay | Attending: Emergency Medicine | Admitting: Emergency Medicine

## 2011-12-13 DIAGNOSIS — N390 Urinary tract infection, site not specified: Secondary | ICD-10-CM

## 2011-12-13 DIAGNOSIS — F17200 Nicotine dependence, unspecified, uncomplicated: Secondary | ICD-10-CM | POA: Insufficient documentation

## 2011-12-13 DIAGNOSIS — R109 Unspecified abdominal pain: Secondary | ICD-10-CM

## 2011-12-13 DIAGNOSIS — M549 Dorsalgia, unspecified: Secondary | ICD-10-CM

## 2011-12-13 LAB — COMPREHENSIVE METABOLIC PANEL
ALT: 20 U/L (ref 0–35)
AST: 20 U/L (ref 0–37)
Alkaline Phosphatase: 47 U/L (ref 39–117)
CO2: 25 mEq/L (ref 19–32)
Chloride: 106 mEq/L (ref 96–112)
GFR calc Af Amer: >90 mL/min (ref >90)
GFR calc non Af Amer: >90 mL/min (ref >90)
Glucose, Bld: 95 mg/dL (ref 70–99)
Sodium: 139 mEq/L (ref 135–145)
Total Bilirubin: 0.6 mg/dL (ref 0.3–1.2)

## 2011-12-13 LAB — CBC WITH DIFFERENTIAL/PLATELET
Basophils Relative: 0 % (ref 0–1)
Eosinophils Relative: 3 % (ref 0–5)
Hemoglobin: 9.1 g/dL — ABNORMAL LOW (ref 12.0–15.0)
MCH: 20.7 pg — ABNORMAL LOW (ref 26.0–34.0)
MCV: 69 fL — ABNORMAL LOW (ref 78.0–100.0)
Monocytes Absolute: 0.4 10*3/uL (ref 0.1–1.0)
Monocytes Relative: 8 % (ref 3–12)
Neutrophils Relative %: 63 % (ref 43–77)
Platelets: 272 10*3/uL (ref 150–400)
RBC: 4.39 MIL/uL (ref 3.87–5.11)
WBC: 4.5 10*3/uL (ref 4.0–10.5)

## 2011-12-13 LAB — PREGNANCY, URINE: Preg Test, Ur: NEGATIVE

## 2011-12-13 LAB — URINE MICROSCOPIC-ADD ON

## 2011-12-13 LAB — URINALYSIS, ROUTINE W REFLEX MICROSCOPIC
Ketones, ur: 15 mg/dL — AB
Nitrite: NEGATIVE
Specific Gravity, Urine: 1.027 (ref 1.005–1.030)
pH: 5.5 (ref 5.0–8.0)

## 2011-12-13 MED ORDER — CIPROFLOXACIN HCL 500 MG PO TABS: 500.0000 mg | ORAL_TABLET | Freq: Two times a day (BID) | ORAL | Status: AC

## 2011-12-13 MED ORDER — IBUPROFEN 800 MG PO TABS: 800.0000 mg | ORAL_TABLET | Freq: Three times a day (TID) | ORAL | Status: AC

## 2011-12-13 MED ORDER — FAMOTIDINE 20 MG PO TABS: 20.0000 mg | ORAL_TABLET | Freq: Two times a day (BID) | ORAL | Status: DC

## 2011-12-13 MED ORDER — HYDROCODONE-ACETAMINOPHEN 5-325 MG PO TABS: 2.0000 | ORAL_TABLET | ORAL | Status: AC | PRN

## 2011-12-13 MED ORDER — KETOROLAC TROMETHAMINE 60 MG/2ML IM SOLN
INTRAMUSCULAR | Status: AC
  Administered 2011-12-13: 60 mg
  Filled 2011-12-13: qty 2

## 2011-12-13 NOTE — ED Provider Notes (Signed)
Medical screening examination/treatment/procedure(s) were performed by non-physician practitioner and as supervising physician I was immediately available for consultation/collaboration.   Evalise Abruzzese, MD 12/13/11 1538 

## 2011-12-13 NOTE — ED Notes (Signed)
Pt reports a 3 month hx of abdominal pain, 2 week hx of back pain and vaginal d/c.

## 2011-12-13 NOTE — ED Provider Notes (Signed)
History     CSN: [id number]  Arrival date & time 12/13/11  1226   None     Chief Complaint  Patient presents with  . Abdominal Pain  . Back Pain  . Vaginal Discharge    (Consider location/radiation/quality/duration/timing/severity/associated sxs/prior treatment) Patient is a 26 y.o. female presenting with abdominal pain. The history is provided by the patient. No language interpreter was used.  Abdominal Pain The primary symptoms of the illness include abdominal pain. The current episode started 2 days ago. The onset of the illness was gradual. The problem has been gradually worsening.  Additional symptoms associated with the illness include back pain. Symptoms associated with the illness do not include chills.   Pt complains of right sided abdominal pain and back pain.   Pt reports she has had abdominal pain for 3 months.  No relief with prilosec.   Pt reports she had back pain for 2 weeks. Past Medical History  Diagnosis Date  . Anemia   . Bronchitis     History reviewed. No pertinent past surgical history.  No family history on file.  History  Substance Use Topics  . Smoking status: Current Everyday Smoker -- 0.5 packs/day  . Smokeless tobacco: Never Used  . Alcohol Use: Yes     occasional    OB History    Grav Para Term Preterm Abortions TAB SAB Ect Mult Living                  Review of Systems  Constitutional: Negative for chills.  Gastrointestinal: Positive for abdominal pain.  Musculoskeletal: Positive for back pain.  All other systems reviewed and are negative.    Allergies  Doxycycline; Amoxicillin-pot clavulanate; Cephalexin; and Septra  Home Medications   Current Outpatient Rx  Name Route Sig Dispense Refill  . IBUPROFEN 200 MG PO TABS Oral Take 400 mg by mouth every 8 (eight) hours. For pain      . IRON COMPLEX PO Oral Take 1 tablet by mouth daily.      . MULTIVITAMIN PO Oral Take 1 tablet by mouth daily.      Marland Kitchen NAPROXEN 500 MG PO TABS  Oral Take 1 tablet (500 mg total) by mouth 2 (two) times daily. 30 tablet 0  . OMEPRAZOLE 20 MG PO CPDR Oral Take 1 capsule (20 mg total) by mouth daily. 30 capsule 0    BP 166/85  Pulse 90  Temp 98.9 F (37.2 C) (Oral)  Resp 18  SpO2 100%  LMP 12/09/2011  Physical Exam  Vitals reviewed. Constitutional: She is oriented to person, place, and time. She appears well-developed and well-nourished.  HENT:  Head: Normocephalic and atraumatic.  Eyes: Pupils are equal, round, and reactive to light.  Cardiovascular: Normal rate.   Pulmonary/Chest: Effort normal and breath sounds normal.  Abdominal: Soft. There is tenderness.       Tender right back and right abdomen  Musculoskeletal: Normal range of motion.  Neurological: She is alert and oriented to person, place, and time. She has normal reflexes.  Skin: Skin is warm.  Psychiatric: She has a normal mood and affect.    ED Course  Procedures (including critical care time)   Labs Reviewed  PREGNANCY, URINE  URINALYSIS, ROUTINE W REFLEX MICROSCOPIC   No results found.   No diagnosis found.    MDM  Pt given rx for cipro and ibuprofen for pain.  I advised prilosec.   Pt given primary care referrals  Lonia Skinner [place], [place] 12/13/11 [phone]

## 2011-12-13 NOTE — ED Notes (Signed)
PA at bedside.

## 2011-12-13 NOTE — ED Notes (Signed)
Pt medicated for back pain.  Informed of delay and plan of care.

## 2012-03-31 ENCOUNTER — Emergency Department (HOSPITAL_BASED_OUTPATIENT_CLINIC_OR_DEPARTMENT_OTHER)
Admission: EM | Admit: 2012-03-31 | Discharge: 2012-03-31 | Disposition: A | Discharge status: Home / Self Care | Payer: Self-pay | Attending: Emergency Medicine | Admitting: Emergency Medicine

## 2012-03-31 ENCOUNTER — Encounter (HOSPITAL_BASED_OUTPATIENT_CLINIC_OR_DEPARTMENT_OTHER): Payer: Self-pay | Admitting: *Deleted

## 2012-03-31 DIAGNOSIS — Z791 Long term (current) use of non-steroidal anti-inflammatories (NSAID): Secondary | ICD-10-CM | POA: Insufficient documentation

## 2012-03-31 DIAGNOSIS — Z79899 Other long term (current) drug therapy: Secondary | ICD-10-CM | POA: Insufficient documentation

## 2012-03-31 DIAGNOSIS — N730 Acute parametritis and pelvic cellulitis: Secondary | ICD-10-CM

## 2012-03-31 DIAGNOSIS — N739 Female pelvic inflammatory disease, unspecified: Secondary | ICD-10-CM | POA: Insufficient documentation

## 2012-03-31 DIAGNOSIS — Z862 Personal history of diseases of the blood and blood-forming organs and certain disorders involving the immune mechanism: Secondary | ICD-10-CM | POA: Insufficient documentation

## 2012-03-31 DIAGNOSIS — F17200 Nicotine dependence, unspecified, uncomplicated: Secondary | ICD-10-CM | POA: Insufficient documentation

## 2012-03-31 DIAGNOSIS — N898 Other specified noninflammatory disorders of vagina: Secondary | ICD-10-CM | POA: Insufficient documentation

## 2012-03-31 LAB — WET PREP, GENITAL: Yeast Wet Prep HPF POC: NONE SEEN

## 2012-03-31 LAB — URINALYSIS, ROUTINE W REFLEX MICROSCOPIC
Bilirubin Urine: NEGATIVE
Glucose, UA: NEGATIVE mg/dL
Hgb urine dipstick: NEGATIVE
Ketones, ur: NEGATIVE mg/dL
Protein, ur: NEGATIVE mg/dL
Urobilinogen, UA: 0.2 mg/dL (ref 0.0–1.0)

## 2012-03-31 LAB — PREGNANCY, URINE: Preg Test, Ur: NEGATIVE

## 2012-03-31 LAB — URINE MICROSCOPIC-ADD ON

## 2012-03-31 MED ORDER — METRONIDAZOLE 500 MG PO TABS: 500.0000 mg | ORAL_TABLET | Freq: Two times a day (BID) | ORAL | Status: DC

## 2012-03-31 MED ORDER — HYDROCODONE-ACETAMINOPHEN 5-325 MG PO TABS: 2.0000 | ORAL_TABLET | ORAL | Status: DC | PRN

## 2012-03-31 MED ORDER — LEVOFLOXACIN 500 MG PO TABS: 500.0000 mg | ORAL_TABLET | Freq: Every day | ORAL | Status: DC

## 2012-03-31 MED ORDER — IBUPROFEN 800 MG PO TABS: 800.0000 mg | ORAL_TABLET | Freq: Once | ORAL | Status: DC

## 2012-03-31 MED ORDER — HYDROCODONE-ACETAMINOPHEN 5-325 MG PO TABS
2.0000 | ORAL_TABLET | Freq: Once | ORAL | Status: AC
  Administered 2012-03-31: 2 via ORAL
  Filled 2012-03-31: qty 2

## 2012-03-31 NOTE — ED Notes (Signed)
Abd pain and white, bld-tinged d/c x 3 weeks LMP 3 weeks ago

## 2012-03-31 NOTE — ED Provider Notes (Signed)
History     CSN: [id number]  Arrival date & time 03/31/12  1539   First MD Initiated Contact with Patient 03/31/12 1646      Chief Complaint  Patient presents with  . Abdominal Pain    (Consider location/radiation/quality/duration/timing/severity/associated sxs/prior treatment) HPI Comments: Patient is a 26 year old female who presents with a 1 week history of lower abdominal pain. The pain is located in the lower abdomen and does not radiate. The pain is described as cramping and severe. The pain started gradually and progressively worsened since the onset. No alleviating/aggravating factors. The patient has tried nothing for symptoms without relief. Associated symptoms include white vaginal discharge. Patient denies fever, headache, NVD, chest pain, SOB, dysuria, constipation, abnormal vaginal bleeding/discharge. LMP 3 weeks ago.     Past Medical History  Diagnosis Date  . Anemia   . Bronchitis     History reviewed. No pertinent past surgical history.  History reviewed. No pertinent family history.  History  Substance Use Topics  . Smoking status: Current Every Day Smoker -- 0.5 packs/day  . Smokeless tobacco: Never Used  . Alcohol Use: Yes     Comment: occasional    OB History    Grav Para Term Preterm Abortions TAB SAB Ect Mult Living                  Review of Systems  Gastrointestinal: Positive for abdominal pain.  All other systems reviewed and are negative.    Allergies  Doxycycline; Amoxicillin-pot clavulanate; Cephalexin; and Septra  Home Medications   Current Outpatient Rx  Name  Route  Sig  Dispense  Refill  . FAMOTIDINE 20 MG PO TABS   Oral   Take 1 tablet (20 mg total) by mouth 2 (two) times daily.   30 tablet   0   . IBUPROFEN 200 MG PO TABS   Oral   Take 400 mg by mouth every 8 (eight) hours. For pain           . IRON COMPLEX PO   Oral   Take 1 tablet by mouth daily.           . MULTIVITAMIN PO   Oral   Take 1 tablet by  mouth daily.           Marland Kitchen NAPROXEN 500 MG PO TABS   Oral   Take 1 tablet (500 mg total) by mouth 2 (two) times daily.   30 tablet   0   . NAPROXEN PO   Oral   Take 1 tablet by mouth daily as needed. For pain.         Marland Kitchen OMEPRAZOLE 20 MG PO CPDR   Oral   Take 1 capsule (20 mg total) by mouth daily.   30 capsule   0     BP 155/88  Pulse 88  Temp 98.3 F (36.8 C) (Oral)  Resp 20  Ht 5\' 9"  (1.753 m)  Wt 290 lb (131.543 kg)  BMI 42.83 kg/m2  SpO2 100%  LMP 03/10/2012  Physical Exam  Nursing note and vitals reviewed. Constitutional: She appears well-developed and well-nourished. No distress.  HENT:  Head: Normocephalic and atraumatic.  Eyes: Conjunctivae normal are normal.  Neck: Normal range of motion. Neck supple.  Cardiovascular: Normal rate and regular rhythm.  Exam reveals no gallop and no friction rub.   No murmur heard. Pulmonary/Chest: Effort normal and breath sounds normal. She has no wheezes. She has no rales. She exhibits no  tenderness.  Abdominal: Soft. There is no tenderness.  Genitourinary:       Copious amount of white discharge in vagina. Cervical os closed. No blood noted. Cervical motion tenderness noted. Bimanual exam reveals no masses or adnexal tenderness.   Musculoskeletal: Normal range of motion.  Neurological: She is alert.       Speech is goal-oriented. Moves limbs without ataxia.   Skin: Skin is warm and dry. She is not diaphoretic.  Psychiatric: She has a normal mood and affect. Her behavior is normal.    ED Course  Procedures (including critical care time)  Labs Reviewed  URINALYSIS, ROUTINE W REFLEX MICROSCOPIC - Abnormal; Notable for the following:    Leukocytes, UA SMALL (*)     All other components within normal limits  URINE MICROSCOPIC-ADD ON - Abnormal; Notable for the following:    Squamous Epithelial / LPF FEW (*)     All other components within normal limits  WET PREP, GENITAL - Abnormal; Notable for the following:     Clue Cells Wet Prep HPF POC MANY (*)     WBC, Wet Prep HPF POC TOO NUMEROUS TO COUNT (*)     All other components within normal limits  PREGNANCY, URINE  GC/CHLAMYDIA PROBE AMP   No results found.   1. PID (acute pelvic inflammatory disease)       MDM  6:18 PM Patient likely has PID given pain associated with BV and TNTC WBC on wet prep. GC chlamydia pending and patient will be contacted for treatment if results are positive. Patient will be discharged with Flagyl, Levaquin and pain medication. Recommended OBGYN follow up as needed.       Emilia Beck, PA-C 04/03/12 2359

## 2012-03-31 NOTE — ED Notes (Signed)
Pelvic cart is set up at the bedside and ready for the doctor to use. 

## 2012-04-03 ENCOUNTER — Emergency Department (HOSPITAL_BASED_OUTPATIENT_CLINIC_OR_DEPARTMENT_OTHER)
Admission: EM | Admit: 2012-04-03 | Discharge: 2012-04-03 | Disposition: A | Discharge status: Home / Self Care | Payer: Self-pay | Attending: Emergency Medicine | Admitting: Emergency Medicine

## 2012-04-03 ENCOUNTER — Encounter (HOSPITAL_BASED_OUTPATIENT_CLINIC_OR_DEPARTMENT_OTHER): Payer: Self-pay | Admitting: Emergency Medicine

## 2012-04-03 DIAGNOSIS — Z79899 Other long term (current) drug therapy: Secondary | ICD-10-CM | POA: Insufficient documentation

## 2012-04-03 DIAGNOSIS — Z8709 Personal history of other diseases of the respiratory system: Secondary | ICD-10-CM | POA: Insufficient documentation

## 2012-04-03 DIAGNOSIS — J45909 Unspecified asthma, uncomplicated: Secondary | ICD-10-CM | POA: Insufficient documentation

## 2012-04-03 DIAGNOSIS — M545 Low back pain, unspecified: Secondary | ICD-10-CM | POA: Insufficient documentation

## 2012-04-03 DIAGNOSIS — F17200 Nicotine dependence, unspecified, uncomplicated: Secondary | ICD-10-CM | POA: Insufficient documentation

## 2012-04-03 DIAGNOSIS — N760 Acute vaginitis: Secondary | ICD-10-CM | POA: Insufficient documentation

## 2012-04-03 LAB — URINALYSIS, ROUTINE W REFLEX MICROSCOPIC
Glucose, UA: NEGATIVE mg/dL
Hgb urine dipstick: NEGATIVE
Ketones, ur: NEGATIVE mg/dL
pH: 5.5 (ref 5.0–8.0)

## 2012-04-03 LAB — URINE MICROSCOPIC-ADD ON

## 2012-04-03 LAB — PREGNANCY, URINE: Preg Test, Ur: NEGATIVE

## 2012-04-03 MED ORDER — HYDROCODONE-ACETAMINOPHEN 5-325 MG PO TABS
1.0000 | ORAL_TABLET | Freq: Once | ORAL | Status: AC
  Administered 2012-04-03: 1 via ORAL
  Filled 2012-04-03: qty 1

## 2012-04-03 NOTE — ED Provider Notes (Signed)
History     CSN: [id number]  Arrival date & time 04/03/12  1614   First MD Initiated Contact with Patient 04/03/12 1631      Chief Complaint  Patient presents with  . Pelvic Pain    (Consider location/radiation/quality/duration/timing/severity/associated sxs/prior treatment) HPI Comments: Pt states that she was seen in the er for pid a couple of days ago and she couldn't afford rx's:pt states that she was told to come in a get a shot:pt denies fever:pt states that she is continuing to have lower back pain  The history is provided by the patient. No language interpreter was used.    Past Medical History  Diagnosis Date  . Anemia   . Bronchitis     History reviewed. No pertinent past surgical history.  No family history on file.  History  Substance Use Topics  . Smoking status: Current Every Day Smoker -- 0.5 packs/day  . Smokeless tobacco: Never Used  . Alcohol Use: Yes     Comment: occasional    OB History    Grav Para Term Preterm Abortions TAB SAB Ect Mult Living                  Review of Systems  Constitutional: Negative.   Respiratory: Negative.   Cardiovascular: Negative.     Allergies  Doxycycline; Amoxicillin-pot clavulanate; Cephalexin; and Septra  Home Medications   Current Outpatient Rx  Name  Route  Sig  Dispense  Refill  . FAMOTIDINE 20 MG PO TABS   Oral   Take 1 tablet (20 mg total) by mouth 2 (two) times daily.   30 tablet   0   . HYDROCODONE-ACETAMINOPHEN 5-325 MG PO TABS   Oral   Take 2 tablets by mouth every 4 (four) hours as needed for pain.   8 tablet   0   . IBUPROFEN 200 MG PO TABS   Oral   Take 400 mg by mouth every 8 (eight) hours. For pain           . IRON COMPLEX PO   Oral   Take 1 tablet by mouth daily.           Marland Kitchen LEVOFLOXACIN 500 MG PO TABS   Oral   Take 1 tablet (500 mg total) by mouth daily.   14 tablet   0   . METRONIDAZOLE 500 MG PO TABS   Oral   Take 1 tablet (500 mg total) by mouth 2 (two)  times daily.   28 tablet   0   . MULTIVITAMIN PO   Oral   Take 1 tablet by mouth daily.           Marland Kitchen NAPROXEN 500 MG PO TABS   Oral   Take 1 tablet (500 mg total) by mouth 2 (two) times daily.   30 tablet   0   . NAPROXEN PO   Oral   Take 1 tablet by mouth daily as needed. For pain.         Marland Kitchen OMEPRAZOLE 20 MG PO CPDR   Oral   Take 1 capsule (20 mg total) by mouth daily.   30 capsule   0     BP 140/97  Pulse 87  Temp 98.4 F (36.9 C) (Oral)  Resp 18  Ht 5\' 9"  (1.753 m)  Wt 290 lb (131.543 kg)  BMI 42.83 kg/m2  SpO2 100%  LMP 03/10/2012  Physical Exam  Nursing note and vitals reviewed. Constitutional: She is  oriented to person, place, and time. She appears well-developed and well-nourished.  HENT:  Head: Normocephalic and atraumatic.  Cardiovascular: Normal rate and regular rhythm.   Pulmonary/Chest: Effort normal and breath sounds normal.  Abdominal: There is no CVA tenderness.  Musculoskeletal: Normal range of motion.  Neurological: She is alert and oriented to person, place, and time.  Skin: Skin is warm and dry.    ED Course  Procedures (including critical care time)   Labs Reviewed  URINALYSIS, ROUTINE W REFLEX MICROSCOPIC  PREGNANCY, URINE   No results found.   No diagnosis found.    MDM  Pt cultures came back negative:will have pt fill flagyl and urine sent as pt urine was not clean the other day        Teressa Lower, NP 04/03/12 1716

## 2012-04-03 NOTE — ED Notes (Signed)
Pt states she was seen here on 03/31/2012 and dx w/ "pelvic inflammatory disease and bacteria vaginosis". Pt states she is unable to afford prescriptions and called here and was told she "can get a shot instead of having prescriptions filled."

## 2012-04-03 NOTE — ED Notes (Signed)
Pt sts she was seen 3 days ago and diagnosed with PID and BV.  She was not able to afford the rx's. Sts she called and was told she could come back and "get a shot" instead of the RX.

## 2012-04-03 NOTE — ED Provider Notes (Signed)
Medical screening examination/treatment/procedure(s) were performed by non-physician practitioner and as supervising physician I was immediately available for consultation/collaboration.   Charles B. Bernette Mayers, MD 04/03/12 2036

## 2012-04-03 NOTE — ED Provider Notes (Signed)
Medical screening examination/treatment/procedure(s) were performed by non-physician practitioner and as supervising physician I was immediately available for consultation/collaboration.   Kelen Laura B. Shiron Whetsel, MD 04/03/12 2036 

## 2012-04-03 NOTE — ED Provider Notes (Signed)
Urine returned shows 3-5 wbc's few bacteria.   I will culture urine.   Pt advised to fell rx for vicodin and flagyl.     Lonia Skinner Driscoll, [place] 04/03/12 1730

## 2012-04-06 LAB — URINE CULTURE

## 2012-04-07 NOTE — ED Notes (Signed)
+  Urine. Patient treated with Levaquin. Sensitive to same. Per protocol MD. °

## 2012-04-08 NOTE — ED Provider Notes (Signed)
Medical screening examination/treatment/procedure(s) were performed by non-physician practitioner and as supervising physician I was immediately available for consultation/collaboration.   Imari Sivertsen, MD 04/08/12 0816 

## 2012-05-09 ENCOUNTER — Emergency Department: Payer: Self-pay | Admitting: Emergency Medicine

## 2012-06-05 ENCOUNTER — Emergency Department: Payer: Self-pay

## 2012-06-05 LAB — URINALYSIS, COMPLETE
Glucose,UR: NEGATIVE mg/dL (ref 0–75)
Ph: 5 (ref 4.5–8.0)
Specific Gravity: 1.018 (ref 1.003–1.030)
Squamous Epithelial: 2
WBC UR: 4 /HPF (ref 0–5)

## 2012-07-16 ENCOUNTER — Emergency Department: Payer: Self-pay | Admitting: Emergency Medicine

## 2012-07-16 LAB — CBC WITH DIFFERENTIAL/PLATELET
Basophil #: 0 10*3/uL (ref 0.0–0.1)
HCT: 29.1 % — ABNORMAL LOW (ref 35.0–47.0)
Lymphocyte #: 1.7 10*3/uL (ref 1.0–3.6)
MCH: 20.3 pg — ABNORMAL LOW (ref 26.0–34.0)
Neutrophil #: 3.1 10*3/uL (ref 1.4–6.5)
Neutrophil %: 56.3 %
Platelet: 330 10*3/uL (ref 150–440)
RDW: 17.5 % — ABNORMAL HIGH (ref 11.5–14.5)

## 2012-07-16 LAB — COMPREHENSIVE METABOLIC PANEL
Alkaline Phosphatase: 51 U/L (ref 50–136)
Anion Gap: 3 — ABNORMAL LOW (ref 7–16)
BUN: 9 mg/dL (ref 7–18)
Calcium, Total: 8.4 mg/dL — ABNORMAL LOW (ref 8.5–10.1)
Co2: 25 mmol/L (ref 21–32)
EGFR (Non-African Amer.): >60
Potassium: 3.9 mmol/L (ref 3.5–5.1)
SGOT(AST): 22 U/L (ref 15–37)
Sodium: 140 mmol/L (ref 136–145)
Total Protein: 6.4 g/dL (ref 6.4–8.2)

## 2012-07-21 ENCOUNTER — Emergency Department: Payer: Self-pay | Admitting: Emergency Medicine

## 2012-07-21 LAB — CBC
HCT: 30.2 % — ABNORMAL LOW (ref 35.0–47.0)
HGB: 9.1 g/dL — ABNORMAL LOW (ref 12.0–16.0)
MCH: 19.6 pg — ABNORMAL LOW (ref 26.0–34.0)
Platelet: 317 10*3/uL (ref 150–440)
RDW: 17.1 % — ABNORMAL HIGH (ref 11.5–14.5)
WBC: 5.1 10*3/uL (ref 3.6–11.0)

## 2012-07-21 LAB — URINALYSIS, COMPLETE
Bacteria: NONE SEEN
Bilirubin,UR: NEGATIVE
Glucose,UR: NEGATIVE mg/dL (ref 0–75)
Squamous Epithelial: 2

## 2012-07-21 LAB — COMPREHENSIVE METABOLIC PANEL
Albumin: 3.9 g/dL (ref 3.4–5.0)
Anion Gap: 8 (ref 7–16)
BUN: 11 mg/dL (ref 7–18)
Bilirubin,Total: 0.4 mg/dL (ref 0.2–1.0)
Chloride: 109 mmol/L — ABNORMAL HIGH (ref 98–107)
Co2: 22 mmol/L (ref 21–32)
Creatinine: 0.68 mg/dL (ref 0.60–1.30)
EGFR (Non-African Amer.): >60
Osmolality: 277 (ref 275–301)
Potassium: 3.9 mmol/L (ref 3.5–5.1)
SGOT(AST): 15 U/L (ref 15–37)
Sodium: 139 mmol/L (ref 136–145)
Total Protein: 7 g/dL (ref 6.4–8.2)

## 2012-07-21 LAB — SEDIMENTATION RATE: Erythrocyte Sed Rate: 5 mm/hr (ref 0–20)

## 2012-07-24 ENCOUNTER — Emergency Department: Payer: Self-pay | Admitting: Emergency Medicine

## 2012-08-01 ENCOUNTER — Emergency Department: Payer: Self-pay | Admitting: Emergency Medicine

## 2012-10-17 ENCOUNTER — Emergency Department: Payer: Self-pay | Admitting: Emergency Medicine

## 2012-11-22 ENCOUNTER — Emergency Department: Payer: Self-pay | Admitting: Emergency Medicine

## 2012-11-22 LAB — URINALYSIS, COMPLETE
Leukocyte Esterase: NEGATIVE
Nitrite: NEGATIVE
Specific Gravity: 1.018 (ref 1.003–1.030)

## 2012-11-22 LAB — WET PREP, GENITAL

## 2012-12-31 ENCOUNTER — Emergency Department (HOSPITAL_BASED_OUTPATIENT_CLINIC_OR_DEPARTMENT_OTHER)
Admission: EM | Admit: 2012-12-31 | Discharge: 2012-12-31 | Disposition: A | Discharge status: Home / Self Care | Payer: Self-pay | Attending: Emergency Medicine | Admitting: Emergency Medicine

## 2012-12-31 ENCOUNTER — Encounter (HOSPITAL_BASED_OUTPATIENT_CLINIC_OR_DEPARTMENT_OTHER): Payer: Self-pay | Admitting: *Deleted

## 2012-12-31 ENCOUNTER — Emergency Department (HOSPITAL_BASED_OUTPATIENT_CLINIC_OR_DEPARTMENT_OTHER): Payer: Self-pay

## 2012-12-31 DIAGNOSIS — Z79899 Other long term (current) drug therapy: Secondary | ICD-10-CM | POA: Insufficient documentation

## 2012-12-31 DIAGNOSIS — K2970 Gastritis, unspecified, without bleeding: Secondary | ICD-10-CM | POA: Insufficient documentation

## 2012-12-31 DIAGNOSIS — J40 Bronchitis, not specified as acute or chronic: Secondary | ICD-10-CM | POA: Insufficient documentation

## 2012-12-31 DIAGNOSIS — F17200 Nicotine dependence, unspecified, uncomplicated: Secondary | ICD-10-CM | POA: Insufficient documentation

## 2012-12-31 DIAGNOSIS — Z3202 Encounter for pregnancy test, result negative: Secondary | ICD-10-CM | POA: Insufficient documentation

## 2012-12-31 DIAGNOSIS — D649 Anemia, unspecified: Secondary | ICD-10-CM | POA: Insufficient documentation

## 2012-12-31 DIAGNOSIS — R110 Nausea: Secondary | ICD-10-CM | POA: Insufficient documentation

## 2012-12-31 LAB — COMPREHENSIVE METABOLIC PANEL
ALT: 37 U/L — ABNORMAL HIGH (ref 0–35)
Alkaline Phosphatase: 53 U/L (ref 39–117)
CO2: 24 mEq/L (ref 19–32)
Chloride: 105 mEq/L (ref 96–112)
GFR calc Af Amer: >90 mL/min (ref >90)
GFR calc non Af Amer: >90 mL/min (ref >90)
Glucose, Bld: 99 mg/dL (ref 70–99)
Potassium: 4 mEq/L (ref 3.5–5.1)
Sodium: 139 mEq/L (ref 135–145)
Total Protein: 6.7 g/dL (ref 6.0–8.3)

## 2012-12-31 LAB — CBC WITH DIFFERENTIAL/PLATELET
Lymphocytes Relative: 32 % (ref 12–46)
Lymphs Abs: 2 10*3/uL (ref 0.7–4.0)
Neutro Abs: 3.6 10*3/uL (ref 1.7–7.7)
Neutrophils Relative %: 57 % (ref 43–77)
Platelets: 274 10*3/uL (ref 150–400)
RBC: 5.27 MIL/uL — ABNORMAL HIGH (ref 3.87–5.11)
WBC: 6.3 10*3/uL (ref 4.0–10.5)

## 2012-12-31 LAB — URINALYSIS, ROUTINE W REFLEX MICROSCOPIC
Glucose, UA: NEGATIVE mg/dL
Leukocytes, UA: NEGATIVE
Nitrite: NEGATIVE
Specific Gravity, Urine: 1.022 (ref 1.005–1.030)
pH: 5 (ref 5.0–8.0)

## 2012-12-31 LAB — URINE MICROSCOPIC-ADD ON

## 2012-12-31 MED ORDER — ONDANSETRON HCL 4 MG/2ML IJ SOLN
INTRAMUSCULAR | Status: AC
  Filled 2012-12-31: qty 2

## 2012-12-31 MED ORDER — MORPHINE SULFATE 4 MG/ML IJ SOLN
4.0000 mg | Freq: Once | INTRAMUSCULAR | Status: AC
  Administered 2012-12-31: 4 mg via INTRAVENOUS
  Filled 2012-12-31: qty 1

## 2012-12-31 MED ORDER — RANITIDINE HCL 150 MG PO TABS: 150.0000 mg | ORAL_TABLET | Freq: Two times a day (BID) | ORAL | Status: DC

## 2012-12-31 MED ORDER — SUCRALFATE 1 GM/10ML PO SUSP: 1.0000 g | Freq: Four times a day (QID) | ORAL | Status: DC

## 2012-12-31 MED ORDER — ACETAMINOPHEN 325 MG PO TABS
ORAL_TABLET | ORAL | Status: AC
  Filled 2012-12-31: qty 2

## 2012-12-31 MED ORDER — SODIUM CHLORIDE 0.9 % IV BOLUS (SEPSIS)
1000.0000 mL | Freq: Once | INTRAVENOUS | Status: AC
  Administered 2012-12-31: 1000 mL via INTRAVENOUS

## 2012-12-31 MED ORDER — GI COCKTAIL ~~LOC~~
30.0000 mL | Freq: Once | ORAL | Status: AC
  Administered 2012-12-31: 30 mL via ORAL
  Filled 2012-12-31: qty 30

## 2012-12-31 MED ORDER — ONDANSETRON 8 MG PO TBDP
8.0000 mg | ORAL_TABLET | Freq: Once | ORAL | Status: AC
  Administered 2012-12-31: 8 mg via ORAL
  Filled 2012-12-31: qty 1

## 2012-12-31 NOTE — ED Provider Notes (Signed)
CSN: [id number]     Arrival date & time 12/31/12  1515 History     First MD Initiated Contact with Patient 12/31/12 1544     Chief Complaint  Patient presents with  . Abdominal Pain   (Consider location/radiation/quality/duration/timing/severity/associated sxs/prior Treatment) HPI Comments: Patient is a 27 year old female Presents today with 2 weeks of abdominal pain. The pain is a dull pain in her  epigastric area. The pain radiates up her esophagus. She is not sure what makes her pain worse. She has tried gas pills, ibuprofen with no relief. Her pain is not related to eating food. She has eaten a hot pocket today. She states this did not make her pain worse, because her pain was already very bad. It is associated with nausea without vomiting. Her last bowel movement was earlier today. She denies any darkening of her stool or any blood in her stool. She has had pain like this in the past and was evaluated at [facility]. She states they could not find anything wrong, but gave her antibiotics and pain medicine which made her pain go away. She denies fever, chills, diaphoresis, shortness of breath, chest pain.  Patient is a 27 y.o. female presenting with abdominal pain. The history is provided by the patient. No language interpreter was used.  Abdominal Pain Associated symptoms: nausea   Associated symptoms: no chest pain, no chills, no constipation, no diarrhea, no fever, no shortness of breath and no vomiting     Past Medical History  Diagnosis Date  . Anemia   . Bronchitis    History reviewed. No pertinent past surgical history. No family history on file. History  Substance Use Topics  . Smoking status: Current Every Day Smoker -- 0.50 packs/day  . Smokeless tobacco: Never Used  . Alcohol Use: Yes     Comment: occasional   OB History   Grav Para Term Preterm Abortions TAB SAB Ect Mult Living                 Review of Systems  Constitutional: Negative for fever and  chills.  Respiratory: Negative for shortness of breath.   Cardiovascular: Negative for chest pain.  Gastrointestinal: Positive for nausea and abdominal pain. Negative for vomiting, diarrhea and constipation.  All other systems reviewed and are negative.    Allergies  Doxycycline; Amoxicillin-pot clavulanate; Cephalexin; and Septra  Home Medications   Current Outpatient Rx  Name  Route  Sig  Dispense  Refill  . EXPIRED: famotidine (PEPCID) 20 MG tablet   Oral   Take 1 tablet (20 mg total) by mouth 2 (two) times daily.   30 tablet   0   . HYDROcodone-acetaminophen (NORCO/VICODIN) 5-325 MG per tablet   Oral   Take 2 tablets by mouth every 4 (four) hours as needed for pain.   8 tablet   0   . ibuprofen (ADVIL,MOTRIN) 200 MG tablet   Oral   Take 400 mg by mouth every 8 (eight) hours. For pain           . Iron Combinations (IRON COMPLEX PO)   Oral   Take 1 tablet by mouth daily.           Marland Kitchen levofloxacin (LEVAQUIN) 500 MG tablet   Oral   Take 1 tablet (500 mg total) by mouth daily.   14 tablet   0   . metroNIDAZOLE (FLAGYL) 500 MG tablet   Oral   Take 1 tablet (500 mg total) by mouth  2 (two) times daily.   28 tablet   0   . Multiple Vitamin (MULTIVITAMIN PO)   Oral   Take 1 tablet by mouth daily.           Marland Kitchen NAPROXEN PO   Oral   Take 1 tablet by mouth daily as needed. For pain.         Marland Kitchen EXPIRED: omeprazole (PRILOSEC) 20 MG capsule   Oral   Take 1 capsule (20 mg total) by mouth daily.   30 capsule   0    BP 151/103  Pulse 87  Temp(Src) 98.2 F (36.8 C) (Oral)  Resp 18  Ht 5\' 9"  (1.753 m)  Wt 290 lb (131.543 kg)  BMI 42.81 kg/m2  SpO2 100% Physical Exam  Nursing note and vitals reviewed. Constitutional: She is oriented to person, place, and time. She appears well-developed and well-nourished.  Non-toxic appearance. She does not have a sickly appearance. She does not appear ill. No distress.  Morbidly obese  HENT:  Head: Normocephalic and  atraumatic.  Right Ear: External ear normal.  Left Ear: External ear normal.  Nose: Nose normal.  Mouth/Throat: Oropharynx is clear and moist.  Eyes: Conjunctivae are normal.  Neck: Normal range of motion.  Cardiovascular: Normal rate, regular rhythm and normal heart sounds.   Pulmonary/Chest: Effort normal and breath sounds normal. No stridor. No respiratory distress. She has no wheezes. She has no rales.  Abdominal: Soft. She exhibits no distension. There is generalized tenderness. There is no rigidity, no rebound, no guarding and negative Murphy's sign.  Maximum tenderness in epigastric area  Musculoskeletal: Normal range of motion.  Neurological: She is alert and oriented to person, place, and time. She has normal strength.  Skin: Skin is warm and dry. She is not diaphoretic. No erythema.  Psychiatric: She has a normal mood and affect. Her behavior is normal.    ED Course   Procedures (including critical care time)  Labs Reviewed  URINALYSIS, ROUTINE W REFLEX MICROSCOPIC - Abnormal; Notable for the following:    Hgb urine dipstick MODERATE (*)    All other components within normal limits  CBC WITH DIFFERENTIAL - Abnormal; Notable for the following:    RBC 5.27 (*)    MCV 73.2 (*)    MCH 23.1 (*)    RDW 16.4 (*)    All other components within normal limits  COMPREHENSIVE METABOLIC PANEL - Abnormal; Notable for the following:    ALT 37 (*)    All other components within normal limits  PREGNANCY, URINE  LIPASE, BLOOD  URINE MICROSCOPIC-ADD ON   US Abdomen Complete  12/31/2012   *RADIOLOGY REPORT*  Clinical Data:  History of epigastric pain with radiation into the mid chest.  Nausea.  Obesity.  ABDOMINAL ULTRASOUND COMPLETE  Comparison:  CT 12/11/2008.  Findings:  Gallbladder: No shadowing gallstones or echogenic sludge. No gallbladder wall thickening or pericholecystic fluid. The gallbladder wall thickness measured 2 mm. No sonographic Murphy's sign according to the ultrasound  technologist.  CBD: Normal in caliber measuring 3 mm. No choledocholithiasis is evident.  Liver:  Normal size with slight increased echogenicity of the hepatic parenchymal echotexture without focal parenchymal abnormality. Portal vein is patent with hepatopetal flow.  IVC:  Patent throughout its visualized course in the abdomen. Some portions were obscured by bowel gas.  Pancreas: Pancreatic tissue is obscured by bowel gas.  The small amount of tissue visualized appeared normal.  Spleen:  Normal size and echotexture without focal abnormality.  Length is 8.3 cm.  Right kidney:  No hydronephrosis.  Well-preserved cortex.  Normal parenchymal echotexture without focal abnormalities.  Right renal length is 11.5 cm.  Left kidney:  No hydronephrosis.  Well-preserved cortex.  Normal parenchymal echotexture without focal abnormalities.  Left renal length is 11.6 cm.  Aorta:  Maximum diameter is 2.2 cm.  No aneurysm is evident. Some portions were obscured by bowel gas.  Ascites:  None.  IMPRESSION: No acute abdominal pathology was demonstrated.  No cholelithiasis is evident.  There is slight increased echogenicity of the hepatic parenchymal echotexture compared to the right kidney parenchyma. This may be associated with slight fatty infiltration of the liver but is not specific for it.   Original Report Authenticated By: Onalee Hua Call   1. Gastritis     MDM  Patient is nontoxic, nonseptic appearing, in no apparent distress.  Patient's pain and other symptoms adequately managed in emergency department.  Fluid bolus given.  Labs, imaging and vitals reviewed.  Patient does not meet the SIRS or Sepsis criteria.  On repeat exam patient does not have a surgical abdomin and there are nor peritoneal signs.  No indication of appendicitis, bowel obstruction, bowel perforation, cholecystitis, diverticulitis, PID or ectopic pregnancy.  Patient discharged home with symptomatic treatment and given strict instructions for follow-up with  their primary care physician.  I have also discussed reasons to return immediately to the ER.  Patient expresses understanding and agrees with plan. I discussed this case with Dr. Anitra Lauth who agrees with plan.      Mora Bellman, PA-C 01/01/13 1047

## 2012-12-31 NOTE — ED Notes (Signed)
Abd pain that radiates up, started two weeks ago.

## 2013-01-02 NOTE — ED Provider Notes (Signed)
Medical screening examination/treatment/procedure(s) were performed by non-physician practitioner and as supervising physician I was immediately available for consultation/collaboration.   Gwyneth Sprout, MD 01/02/13 [phone]

## 2013-04-12 ENCOUNTER — Emergency Department: Payer: Self-pay | Admitting: Emergency Medicine

## 2013-04-12 LAB — URINALYSIS, COMPLETE
Bacteria: NONE SEEN
Glucose,UR: NEGATIVE mg/dL (ref 0–75)
Ketone: NEGATIVE
Ph: 5 (ref 4.5–8.0)
Protein: NEGATIVE
Specific Gravity: 1.025 (ref 1.003–1.030)

## 2013-05-19 DIAGNOSIS — I890 Lymphedema, not elsewhere classified: Secondary | ICD-10-CM | POA: Insufficient documentation

## 2013-07-30 ENCOUNTER — Emergency Department: Payer: Self-pay | Admitting: Emergency Medicine

## 2013-07-30 LAB — CBC WITH DIFFERENTIAL/PLATELET
BASOS ABS: 0.1 10*3/uL (ref 0.0–0.1)
BASOS PCT: 1.2 %
EOS PCT: 3.5 %
Eosinophil #: 0.2 10*3/uL (ref 0.0–0.7)
HCT: 39 % (ref 35.0–47.0)
HGB: 12.8 g/dL (ref 12.0–16.0)
LYMPHS ABS: 2.2 10*3/uL (ref 1.0–3.6)
Lymphocyte %: 36.2 %
MCH: 26.7 pg (ref 26.0–34.0)
MCHC: 32.8 g/dL (ref 32.0–36.0)
MCV: 81 fL (ref 80–100)
MONO ABS: 0.3 x10 3/mm (ref 0.2–0.9)
Monocyte %: 5.5 %
Neutrophil #: 3.2 10*3/uL (ref 1.4–6.5)
Neutrophil %: 53.6 %
Platelet: 194 10*3/uL (ref 150–440)
RBC: 4.79 10*6/uL (ref 3.80–5.20)
RDW: 14.5 % (ref 11.5–14.5)
WBC: 6 10*3/uL (ref 3.6–11.0)

## 2013-07-30 LAB — COMPREHENSIVE METABOLIC PANEL
ALBUMIN: 3.8 g/dL (ref 3.4–5.0)
Alkaline Phosphatase: 54 U/L
Anion Gap: 5 — ABNORMAL LOW (ref 7–16)
BUN: 12 mg/dL (ref 7–18)
Bilirubin,Total: 0.3 mg/dL (ref 0.2–1.0)
CALCIUM: 8.6 mg/dL (ref 8.5–10.1)
CHLORIDE: 111 mmol/L — AB (ref 98–107)
CO2: 25 mmol/L (ref 21–32)
CREATININE: 0.74 mg/dL (ref 0.60–1.30)
EGFR (African American): >60
EGFR (Non-African Amer.): >60
GLUCOSE: 113 mg/dL — AB (ref 65–99)
Osmolality: 282 (ref 275–301)
POTASSIUM: 3.6 mmol/L (ref 3.5–5.1)
SGOT(AST): 18 U/L (ref 15–37)
SGPT (ALT): 28 U/L (ref 12–78)
Sodium: 141 mmol/L (ref 136–145)
TOTAL PROTEIN: 6.4 g/dL (ref 6.4–8.2)

## 2013-09-19 ENCOUNTER — Emergency Department: Payer: Self-pay | Admitting: Emergency Medicine

## 2013-11-03 ENCOUNTER — Emergency Department: Payer: Self-pay | Admitting: Emergency Medicine

## 2013-12-08 ENCOUNTER — Emergency Department (HOSPITAL_BASED_OUTPATIENT_CLINIC_OR_DEPARTMENT_OTHER)
Admission: EM | Admit: 2013-12-08 | Discharge: 2013-12-08 | Disposition: A | Discharge status: Home / Self Care | Payer: Self-pay | Attending: Emergency Medicine | Admitting: Emergency Medicine

## 2013-12-08 ENCOUNTER — Encounter (HOSPITAL_BASED_OUTPATIENT_CLINIC_OR_DEPARTMENT_OTHER): Payer: Self-pay | Admitting: Emergency Medicine

## 2013-12-08 DIAGNOSIS — Z8709 Personal history of other diseases of the respiratory system: Secondary | ICD-10-CM | POA: Insufficient documentation

## 2013-12-08 DIAGNOSIS — Z880 Allergy status to penicillin: Secondary | ICD-10-CM | POA: Insufficient documentation

## 2013-12-08 DIAGNOSIS — M79609 Pain in unspecified limb: Secondary | ICD-10-CM | POA: Insufficient documentation

## 2013-12-08 DIAGNOSIS — Z87891 Personal history of nicotine dependence: Secondary | ICD-10-CM | POA: Insufficient documentation

## 2013-12-08 DIAGNOSIS — L309 Dermatitis, unspecified: Secondary | ICD-10-CM

## 2013-12-08 DIAGNOSIS — Z79899 Other long term (current) drug therapy: Secondary | ICD-10-CM | POA: Insufficient documentation

## 2013-12-08 DIAGNOSIS — R21 Rash and other nonspecific skin eruption: Secondary | ICD-10-CM | POA: Insufficient documentation

## 2013-12-08 DIAGNOSIS — M79605 Pain in left leg: Secondary | ICD-10-CM

## 2013-12-08 DIAGNOSIS — D649 Anemia, unspecified: Secondary | ICD-10-CM | POA: Insufficient documentation

## 2013-12-08 DIAGNOSIS — M7989 Other specified soft tissue disorders: Secondary | ICD-10-CM

## 2013-12-08 DIAGNOSIS — M79604 Pain in right leg: Secondary | ICD-10-CM

## 2013-12-08 DIAGNOSIS — L259 Unspecified contact dermatitis, unspecified cause: Secondary | ICD-10-CM | POA: Insufficient documentation

## 2013-12-08 MED ORDER — HYDROCODONE-ACETAMINOPHEN 5-325 MG PO TABS
2.0000 | ORAL_TABLET | Freq: Once | ORAL | Status: AC
  Administered 2013-12-08: 2 via ORAL
  Filled 2013-12-08: qty 2

## 2013-12-08 MED ORDER — HYDROCORTISONE 1 % EX CREA: TOPICAL_CREAM | CUTANEOUS | Status: DC

## 2013-12-08 MED ORDER — HYDROCODONE-ACETAMINOPHEN 5-325 MG PO TABS: 1.0000 | ORAL_TABLET | Freq: Four times a day (QID) | ORAL | Status: DC | PRN

## 2013-12-08 NOTE — ED Notes (Signed)
Pt c/o bil leg swelling and rash x 1 year increased pain and swelling x 3 days

## 2013-12-08 NOTE — ED Notes (Signed)
Pt reports pain to bilateral ankles. Sts swelling to both.

## 2013-12-08 NOTE — ED Provider Notes (Signed)
CSN: [id number]     Arrival date & time 12/08/13  1638 History  This chart was scribed for Dagmar Hait, MD by Milly Jakob, ED Scribe. The patient was seen in room MH08/MH08. Patient's care was started at 6:50 PM.    Chief Complaint  Patient presents with  . Leg Swelling   Patient is a 28 y.o. female presenting with rash. The history is provided by the patient. No language interpreter was used.  Rash Location:  Leg Leg rash location:  L lower leg and R lower leg Quality: itchiness, painful and swelling   Pain details:    Quality:  Numbness, tingling and itching   Severity:  Moderate   Onset quality:  Gradual   Timing:  Constant Severity:  Moderate Ineffective treatments:  Topical steroids and anti-itch cream  HPI Comments: Janet Sheppard is a 28 y.o. female who presents to the Emergency Department complaining of swelling, pain, and skin discoloration on her legs bilaterally onset 1 year ago, as well as tingling and increased pain in the bottom of both feet onset this week. She reports that it is most painful in the morning when she first wakes up, and then states that it remains painful all day. She reports associated itching. She reports having multiple diagnoses for this problem in the past. She denies injury.    Past Medical History  Diagnosis Date  . Anemia   . Bronchitis    History reviewed. No pertinent past surgical history. History reviewed. No pertinent family history. History  Substance Use Topics  . Smoking status: Former Smoker -- 0.50 packs/day  . Smokeless tobacco: Never Used  . Alcohol Use: Yes     Comment: occasional   OB History   Grav Para Term Preterm Abortions TAB SAB Ect Mult Living                 Review of Systems  Cardiovascular: Positive for leg swelling.  Musculoskeletal: Positive for joint swelling (Ankles bilaterally).  Skin: Positive for rash.  Neurological: Positive for numbness.  All other systems reviewed and are  negative.     Allergies  Doxycycline; Amoxicillin-pot clavulanate; Cephalexin; and Septra  Home Medications   Prior to Admission medications   Medication Sig Start Date End Date Taking? Authorizing Provider  famotidine (PEPCID) 20 MG tablet Take 1 tablet (20 mg total) by mouth 2 (two) times daily. 12/13/11 12/12/12  Elson Areas, PA-C  HYDROcodone-acetaminophen (NORCO/VICODIN) 5-325 MG per tablet Take 2 tablets by mouth every 4 (four) hours as needed for pain. 03/31/12   Kaitlyn Szekalski, PA-C  ibuprofen (ADVIL,MOTRIN) 200 MG tablet Take 400 mg by mouth every 8 (eight) hours. For pain      Historical Provider, MD  Iron Combinations (IRON COMPLEX PO) Take 1 tablet by mouth daily.      Historical Provider, MD  levofloxacin (LEVAQUIN) 500 MG tablet Take 1 tablet (500 mg total) by mouth daily. 03/31/12   Kaitlyn Szekalski, PA-C  metroNIDAZOLE (FLAGYL) 500 MG tablet Take 1 tablet (500 mg total) by mouth 2 (two) times daily. 03/31/12   Emilia Beck, PA-C  Multiple Vitamin (MULTIVITAMIN PO) Take 1 tablet by mouth daily.      Historical Provider, MD  NAPROXEN PO Take 1 tablet by mouth daily as needed. For pain.    Historical Provider, MD  omeprazole (PRILOSEC) 20 MG capsule Take 1 capsule (20 mg total) by mouth daily. 08/23/11 08/22/12  Nat Christen, MD  ranitidine (ZANTAC) 150 MG tablet  Take 1 tablet (150 mg total) by mouth 2 (two) times daily. 12/31/12   Mora BellmanHannah S Merrell, PA-C  sucralfate (CARAFATE) 1 GM/10ML suspension Take 10 mL (1 g total) by mouth 4 (four) times daily. 12/31/12   Mora BellmanHannah S Merrell, PA-C   Triage Vitals: BP 154/82  Pulse 94  Temp(Src) 99.2 F (37.3 C) (Oral)  Resp 16  Ht 5\' 9"  (1.753 m)  Wt 330 lb (149.687 kg)  BMI 48.71 kg/m2  SpO2 100% Physical Exam  Nursing note and vitals reviewed. Constitutional: She is oriented to person, place, and time. She appears well-developed and well-nourished. No distress.  HENT:  Head: Normocephalic and atraumatic.   Mouth/Throat: Oropharynx is clear and moist. No oropharyngeal exudate.  Eyes: Conjunctivae and EOM are normal. Pupils are equal, round, and reactive to light.  Neck: Normal range of motion. Neck supple. No tracheal deviation present.  Cardiovascular: Normal rate and regular rhythm.  Exam reveals no friction rub.   No murmur heard. Pulmonary/Chest: Effort normal and breath sounds normal. No respiratory distress. She has no wheezes. She has no rales.  Abdominal: Soft. She exhibits no distension. There is no tenderness. There is no rebound.  Musculoskeletal: Normal range of motion. She exhibits no edema (No appreciable edema in bilateral lower legs).  Diffse tenderness of calves, ankles, feet. No joint effusion  Neurological: She is alert and oriented to person, place, and time. She exhibits normal muscle tone.  Skin: Skin is warm and dry. Rash (hyperpigmentation around R calf circumferentially. Dry, c/w eczema. No cellulitis, petehciae, purpura. ) noted. She is not diaphoretic. No erythema.  Psychiatric: She has a normal mood and affect. Her behavior is normal.    ED Course  Procedures (including critical care time) DIAGNOSTIC STUDIES: Oxygen Saturation is 100% on room air, normal by my interpretation.    COORDINATION OF CARE: 6:58 PM-Discussed treatment plan with pt at bedside and pt agreed to plan.   Labs Review Labs Reviewed - No data to display  Imaging Review No results found.   EKG Interpretation None      MDM   Final diagnoses:  Leg swelling  Eczema  Bilateral leg pain    28 year old female presents with bilateral leg swelling and rash for a year. She also having some increased pain in her legs. She has discoloration with hyperpigmentation circumferentially around the right calf and about half way around the left calf laterally. She has no demonstrable swelling that is markedly worse in the other side. No pitting edema. No signs of cellulitis. She does have some  tenderness in the calves ankles and feet. She reports some tingling in her feet also. I believe she has peripheral neuropathy. She is morbidly obese and she is also a component of arthritis. I do not suspect DVT because of the chronicity and no appreciable unilateral swelling today.  Given pain meds, hydrocortisone cream for her rash with appears eczematous. Instructed to f/u with PCP for possible peripheral neuropathy. Stable for discharge.   I personally performed the services described in this documentation, which was scribed in my presence. The recorded information has been reviewed and is accurate.    Dagmar HaitWilliam Jaliza Seifried, MD 12/08/13 [phone]

## 2013-12-08 NOTE — Discharge Instructions (Signed)
Eczema Eczema, also called atopic dermatitis, is a skin disorder that causes inflammation of the skin. It causes a red rash and dry, scaly skin. The skin becomes very itchy. Eczema is generally worse during the cooler winter months and often improves with the warmth of summer. Eczema usually starts showing signs in infancy. Some children outgrow eczema, but it may last through adulthood.  CAUSES  The exact cause of eczema is not known, but it appears to run in families. People with eczema often have a family history of eczema, allergies, asthma, or hay fever. Eczema is not contagious. Flare-ups of the condition may be caused by:   Contact with something you are sensitive or allergic to.   Stress. SIGNS AND SYMPTOMS  Dry, scaly skin.   Red, itchy rash.   Itchiness. This may occur before the skin rash and may be very intense.  DIAGNOSIS  The diagnosis of eczema is usually made based on symptoms and medical history. TREATMENT  Eczema cannot be cured, but symptoms usually can be controlled with treatment and other strategies. A treatment plan might include:  Controlling the itching and scratching.   Use over-the-counter antihistamines as directed for itching. This is especially useful at night when the itching tends to be worse.   Use over-the-counter steroid creams as directed for itching.   Avoid scratching. Scratching makes the rash and itching worse. It may also result in a skin infection (impetigo) due to a break in the skin caused by scratching.   Keeping the skin well moisturized with creams every day. This will seal in moisture and help prevent dryness. Lotions that contain alcohol and water should be avoided because they can dry the skin.   Limiting exposure to things that you are sensitive or allergic to (allergens).   Recognizing situations that cause stress.   Developing a plan to manage stress.  HOME CARE INSTRUCTIONS   Only take over-the-counter or  prescription medicines as directed by your health care provider.   Do not use anything on the skin without checking with your health care provider.   Keep baths or showers short (5 minutes) in warm (not hot) water. Use mild cleansers for bathing. These should be unscented. You may add nonperfumed bath oil to the bath water. It is best to avoid soap and bubble bath.   Immediately after a bath or shower, when the skin is still damp, apply a moisturizing ointment to the entire body. This ointment should be a petroleum ointment. This will seal in moisture and help prevent dryness. The thicker the ointment, the better. These should be unscented.   Keep fingernails cut short. Children with eczema may need to wear soft gloves or mittens at night after applying an ointment.   Dress in clothes made of cotton or cotton blends. Dress lightly, because heat increases itching.   A child with eczema should stay away from anyone with fever blisters or cold sores. The virus that causes fever blisters (herpes simplex) can cause a serious skin infection in children with eczema. SEEK MEDICAL CARE IF:   Your itching interferes with sleep.   Your rash gets worse or is not better within 1 week after starting treatment.   You see pus or soft yellow scabs in the rash area.   You have a fever.   You have a rash flare-up after contact with someone who has fever blisters.  Document Released: 04/28/2000 Document Revised: 02/19/2013 Document Reviewed: 12/02/2012 [employer] Patient Information 2015 [employer]. This information   is not intended to replace advice given to you by your health care provider. Make sure you discuss any questions you have with your health care provider.  

## 2013-12-25 ENCOUNTER — Emergency Department: Payer: Self-pay | Admitting: Emergency Medicine

## 2013-12-25 LAB — BASIC METABOLIC PANEL
Anion Gap: 9 (ref 7–16)
BUN: 15 mg/dL (ref 7–18)
CALCIUM: 8.7 mg/dL (ref 8.5–10.1)
CHLORIDE: 110 mmol/L — AB (ref 98–107)
CREATININE: 0.77 mg/dL (ref 0.60–1.30)
Co2: 23 mmol/L (ref 21–32)
EGFR (African American): >60
GLUCOSE: 99 mg/dL (ref 65–99)
OSMOLALITY: 284 (ref 275–301)
Potassium: 4 mmol/L (ref 3.5–5.1)
Sodium: 142 mmol/L (ref 136–145)

## 2013-12-25 LAB — CBC WITH DIFFERENTIAL/PLATELET
Basophil #: 0 10*3/uL (ref 0.0–0.1)
Basophil %: 0.3 %
EOS PCT: 2.7 %
Eosinophil #: 0.2 10*3/uL (ref 0.0–0.7)
HCT: 42.1 % (ref 35.0–47.0)
HGB: 13.2 g/dL (ref 12.0–16.0)
LYMPHS ABS: 2.2 10*3/uL (ref 1.0–3.6)
Lymphocyte %: 35.1 %
MCH: 26.3 pg (ref 26.0–34.0)
MCHC: 31.4 g/dL — ABNORMAL LOW (ref 32.0–36.0)
MCV: 84 fL (ref 80–100)
MONO ABS: 0.4 x10 3/mm (ref 0.2–0.9)
Monocyte %: 5.8 %
NEUTROS ABS: 3.5 10*3/uL (ref 1.4–6.5)
Neutrophil %: 56.1 %
PLATELETS: 215 10*3/uL (ref 150–440)
RBC: 5.03 10*6/uL (ref 3.80–5.20)
RDW: 14.2 % (ref 11.5–14.5)
WBC: 6.3 10*3/uL (ref 3.6–11.0)

## 2014-07-29 ENCOUNTER — Emergency Department: Payer: Self-pay | Admitting: Internal Medicine

## 2014-09-03 DIAGNOSIS — I10 Essential (primary) hypertension: Secondary | ICD-10-CM | POA: Insufficient documentation

## 2014-09-03 DIAGNOSIS — M25562 Pain in left knee: Secondary | ICD-10-CM | POA: Insufficient documentation

## 2014-09-15 DIAGNOSIS — I872 Venous insufficiency (chronic) (peripheral): Secondary | ICD-10-CM | POA: Insufficient documentation

## 2014-12-02 ENCOUNTER — Emergency Department: Payer: 59

## 2014-12-02 ENCOUNTER — Emergency Department
Admission: EM | Admit: 2014-12-02 | Discharge: 2014-12-02 | Disposition: A | Discharge status: Home / Self Care | Payer: 59 | Attending: Student | Admitting: Student

## 2014-12-02 ENCOUNTER — Encounter: Payer: Self-pay | Admitting: Emergency Medicine

## 2014-12-02 DIAGNOSIS — M2142 Flat foot [pes planus] (acquired), left foot: Secondary | ICD-10-CM | POA: Diagnosis not present

## 2014-12-02 DIAGNOSIS — Z792 Long term (current) use of antibiotics: Secondary | ICD-10-CM | POA: Insufficient documentation

## 2014-12-02 DIAGNOSIS — M25571 Pain in right ankle and joints of right foot: Secondary | ICD-10-CM | POA: Diagnosis present

## 2014-12-02 DIAGNOSIS — Z880 Allergy status to penicillin: Secondary | ICD-10-CM | POA: Insufficient documentation

## 2014-12-02 DIAGNOSIS — Z87891 Personal history of nicotine dependence: Secondary | ICD-10-CM | POA: Insufficient documentation

## 2014-12-02 DIAGNOSIS — R52 Pain, unspecified: Secondary | ICD-10-CM

## 2014-12-02 DIAGNOSIS — M2141 Flat foot [pes planus] (acquired), right foot: Secondary | ICD-10-CM | POA: Diagnosis not present

## 2014-12-02 DIAGNOSIS — B351 Tinea unguium: Secondary | ICD-10-CM | POA: Diagnosis not present

## 2014-12-02 DIAGNOSIS — Z79899 Other long term (current) drug therapy: Secondary | ICD-10-CM | POA: Diagnosis not present

## 2014-12-02 DIAGNOSIS — M722 Plantar fascial fibromatosis: Secondary | ICD-10-CM | POA: Diagnosis not present

## 2014-12-02 DIAGNOSIS — B353 Tinea pedis: Secondary | ICD-10-CM | POA: Diagnosis not present

## 2014-12-02 DIAGNOSIS — Z87898 Personal history of other specified conditions: Secondary | ICD-10-CM

## 2014-12-02 DIAGNOSIS — I10 Essential (primary) hypertension: Secondary | ICD-10-CM | POA: Diagnosis not present

## 2014-12-02 HISTORY — DX: Essential (primary) hypertension: I10

## 2014-12-02 LAB — HEPATIC FUNCTION PANEL
ALT: 19 U/L (ref 14–54)
AST: 17 U/L (ref 15–41)
Albumin: 4.3 g/dL (ref 3.5–5.0)
Alkaline Phosphatase: 40 U/L (ref 38–126)
BILIRUBIN TOTAL: 0.5 mg/dL (ref 0.3–1.2)
Bilirubin, Direct: <0.1 mg/dL — ABNORMAL LOW (ref 0.1–0.5)
TOTAL PROTEIN: 6.6 g/dL (ref 6.5–8.1)

## 2014-12-02 MED ORDER — OXYCODONE-ACETAMINOPHEN 5-325 MG PO TABS
1.0000 | ORAL_TABLET | Freq: Once | ORAL | Status: AC
  Administered 2014-12-02: 1 via ORAL
  Filled 2014-12-02: qty 1

## 2014-12-02 MED ORDER — NAPROXEN 500 MG PO TABS: 500.0000 mg | ORAL_TABLET | Freq: Two times a day (BID) | ORAL | Status: DC

## 2014-12-02 MED ORDER — OXYCODONE-ACETAMINOPHEN 5-325 MG PO TABS: 1.0000 | ORAL_TABLET | ORAL | Status: DC | PRN

## 2014-12-02 MED ORDER — TERBINAFINE HCL 250 MG PO TABS: 250.0000 mg | ORAL_TABLET | Freq: Every day | ORAL | Status: DC

## 2014-12-02 MED ORDER — NAPROXEN 500 MG PO TABS
500.0000 mg | ORAL_TABLET | Freq: Once | ORAL | Status: AC
  Administered 2014-12-02: 500 mg via ORAL
  Filled 2014-12-02: qty 1

## 2014-12-02 NOTE — ED Notes (Signed)
States she developed right ankle pain several days ago unknown injury

## 2014-12-02 NOTE — ED Provider Notes (Signed)
[facility] Emergency Department Provider Note  ____________________________________________  Time seen: Approximately 3:37 PM  I have reviewed the triage vital signs and the nursing notes.   HISTORY  Chief Complaint Ankle Pain   HPI Janet Sheppard is a 29 y.o. female who presents to the emergency department with complaints of right foot and ankle pain that started two weeks ago.  The pain radiates up to her mid thigh.  She rates it a 9 out of 10 but is still able to walk and function at work.  She notes that she has a history of "left knee problems" that is followed by orthopedic Dr. She has taken ibuprofen with no relief.  Patient notes that she is overweight and that this may be part of the reason for the pain.   Past Medical History  Diagnosis Date  . Anemia   . Bronchitis   . Hypertension     There are no active problems to display for this patient.   History reviewed. No pertinent past surgical history.  Current Outpatient Rx  Name  Route  Sig  Dispense  Refill  . hydrochlorothiazide (HYDRODIURIL) 25 MG tablet   Oral   Take 25 mg by mouth daily.         Marland Kitchen EXPIRED: famotidine (PEPCID) 20 MG tablet   Oral   Take 1 tablet (20 mg total) by mouth 2 (two) times daily.   30 tablet   0   . HYDROcodone-acetaminophen (NORCO/VICODIN) 5-325 MG per tablet   Oral   Take 2 tablets by mouth every 4 (four) hours as needed for pain.   8 tablet   0   . HYDROcodone-acetaminophen (NORCO/VICODIN) 5-325 MG per tablet   Oral   Take 1 tablet by mouth every 6 (six) hours as needed for moderate pain.   10 tablet   0   . hydrocortisone cream 1 %      Apply to affected area 2 times daily   15 g   0   . ibuprofen (ADVIL,MOTRIN) 200 MG tablet   Oral   Take 400 mg by mouth every 8 (eight) hours. For pain           . Iron Combinations (IRON COMPLEX PO)   Oral   Take 1 tablet by mouth daily.           Marland Kitchen levofloxacin (LEVAQUIN) 500 MG tablet  Oral   Take 1 tablet (500 mg total) by mouth daily.   14 tablet   0   . metroNIDAZOLE (FLAGYL) 500 MG tablet   Oral   Take 1 tablet (500 mg total) by mouth 2 (two) times daily.   28 tablet   0   . Multiple Vitamin (MULTIVITAMIN PO)   Oral   Take 1 tablet by mouth daily.           . naproxen (NAPROSYN) 500 MG tablet   Oral   Take 1 tablet (500 mg total) by mouth 2 (two) times daily with a meal.   60 tablet   2   . NAPROXEN PO   Oral   Take 1 tablet by mouth daily as needed. For pain.         Marland Kitchen EXPIRED: omeprazole (PRILOSEC) 20 MG capsule   Oral   Take 1 capsule (20 mg total) by mouth daily.   30 capsule   0   . oxyCODONE-acetaminophen (ROXICET) 5-325 MG per tablet   Oral   Take 1 tablet by mouth  every 4 (four) hours as needed for severe pain.   20 tablet   0   . ranitidine (ZANTAC) 150 MG tablet   Oral   Take 1 tablet (150 mg total) by mouth 2 (two) times daily.   60 tablet   0   . sucralfate (CARAFATE) 1 GM/10ML suspension   Oral   Take 10 mL (1 g total) by mouth 4 (four) times daily.   420 mL   0   . terbinafine (LAMISIL) 250 MG tablet   Oral   Take 1 tablet (250 mg total) by mouth daily.   42 tablet   0     Allergies Doxycycline; Amoxicillin-pot clavulanate; Cephalexin; Septra; and Tramadol  No family history on file.  Social History History  Substance Use Topics  . Smoking status: Former Smoker -- 0.50 packs/day  . Smokeless tobacco: Never Used  . Alcohol Use: Yes     Comment: occasional    Review of Systems Constitutional: No fever/chills Cardiovascular: Denies chest pain. Respiratory: Denies shortness of breath. Musculoskeletal: Negative for back pain. Positive for pain in the right lower extremity, including the ankle and foot.  Chronic problems with the left knee. Skin: Dry skin on feet. 10-point ROS otherwise negative.  ____________________________________________   PHYSICAL EXAM:  VITAL SIGNS: ED Triage Vitals  Enc  Vitals Group     BP 12/02/14 1521 155/95 mmHg     Pulse Rate 12/02/14 1521 86     Resp 12/02/14 1521 20     Temp 12/02/14 1521 98.3 F (36.8 C)     Temp Source 12/02/14 1521 Oral     SpO2 12/02/14 1521 99 %     Weight 12/02/14 1521 350 lb (158.759 kg)     Height 12/02/14 1521  (1.753 m)     Head Cir --      Peak Flow --      Pain Score 12/02/14 1514 9     Pain Loc --      Pain Edu? --      Excl. in GC? --     Constitutional: Alert and oriented. Well appearing and in no acute distress. Is uncomfortable with anyone looking at or touching her feet. Morbid obesity. Cardiovascular: Good peripheral circulation. Mildly elevated blood pressure. Respiratory: Normal respiratory effort.  No retractions.  Musculoskeletal: Decreased ROM of right ankle due to pain with movement. Obvious pes planus. Neurologic:  Normal speech and language. No gross focal neurologic deficits are appreciated. No gait instability. Skin:  Tinea pedis noted bilaterally.  Onychomycosis of the right pinky toe. Psychiatric: Mood and affect are normal. Speech and behavior are normal.  ____________________________________________   LABS (all labs ordered are listed, but only abnormal results are displayed)  Labs Reviewed  HEPATIC FUNCTION PANEL - Abnormal; Notable for the following:    Bilirubin, Direct <0.1 (*)    All other components within normal limits   ____________________________________________  EKG   ____________________________________________  RADIOLOGY  No acute findings.I, Joni Reining, personally viewed and evaluated these images as part of my medical decision making.    ____________________________________________   PROCEDURES  Procedure(s) performed: None  Critical Care performed: No  ____________________________________________   INITIAL IMPRESSION / ASSESSMENT AND PLAN / ED COURSE  Pertinent labs & imaging results that were available during my care of the patient were  reviewed by me and considered in my medical decision making (see chart for details).  Plantar fasciitis, tinea pedis bilaterally, pes planus bilaterally, and onychomysosis right  toe. Patient advised to follow-up with Dr. Orland Jarredroxler for further evaluation feet. Patient had liver function tests prior to starting her on Lamisil. Patient advised follow-up family doctor and continue care. Patient advised to ER if condition worsens. ____________________________________________   FINAL CLINICAL IMPRESSION(S) / ED DIAGNOSES  Final diagnoses:  Plantar fasciitis of right foot  Tinea pedis of both feet  Onychomycosis of toenail  Pes planus of both feet      Joni Reiningonald K Melah Ebling, PA-C 12/02/14 1759  Joni Reiningonald K Zayda Angell, PA-C 12/02/14 1810  Gayla DossEryka A Gayle, MD 12/03/14 1525

## 2014-12-25 ENCOUNTER — Emergency Department
Admission: EM | Admit: 2014-12-25 | Discharge: 2014-12-25 | Disposition: A | Discharge status: Home / Self Care | Payer: 59 | Attending: Emergency Medicine | Admitting: Emergency Medicine

## 2014-12-25 ENCOUNTER — Encounter: Payer: Self-pay | Admitting: Emergency Medicine

## 2014-12-25 DIAGNOSIS — R300 Dysuria: Secondary | ICD-10-CM | POA: Diagnosis not present

## 2014-12-25 DIAGNOSIS — Z79899 Other long term (current) drug therapy: Secondary | ICD-10-CM | POA: Insufficient documentation

## 2014-12-25 DIAGNOSIS — Z880 Allergy status to penicillin: Secondary | ICD-10-CM | POA: Diagnosis not present

## 2014-12-25 DIAGNOSIS — I10 Essential (primary) hypertension: Secondary | ICD-10-CM | POA: Insufficient documentation

## 2014-12-25 DIAGNOSIS — R21 Rash and other nonspecific skin eruption: Secondary | ICD-10-CM | POA: Diagnosis not present

## 2014-12-25 DIAGNOSIS — Z791 Long term (current) use of non-steroidal anti-inflammatories (NSAID): Secondary | ICD-10-CM | POA: Diagnosis not present

## 2014-12-25 DIAGNOSIS — Z87891 Personal history of nicotine dependence: Secondary | ICD-10-CM | POA: Insufficient documentation

## 2014-12-25 DIAGNOSIS — R350 Frequency of micturition: Secondary | ICD-10-CM | POA: Diagnosis present

## 2014-12-25 LAB — URINALYSIS COMPLETE WITH MICROSCOPIC (ARMC ONLY)
Bilirubin Urine: NEGATIVE
Glucose, UA: NEGATIVE mg/dL
Hgb urine dipstick: NEGATIVE
KETONES UR: NEGATIVE mg/dL
Leukocytes, UA: NEGATIVE
NITRITE: NEGATIVE
PH: 5 (ref 5.0–8.0)
PROTEIN: NEGATIVE mg/dL
SPECIFIC GRAVITY, URINE: 1.029 (ref 1.005–1.030)

## 2014-12-25 LAB — WET PREP, GENITAL
CLUE CELLS WET PREP: NONE SEEN
Trich, Wet Prep: NONE SEEN
Yeast Wet Prep HPF POC: NONE SEEN

## 2014-12-25 LAB — POCT PREGNANCY, URINE: Preg Test, Ur: NEGATIVE

## 2014-12-25 NOTE — ED Notes (Signed)
Lab called regarding urine, working on results at this time

## 2014-12-25 NOTE — Discharge Instructions (Signed)
Dysuria Dysuria is the medical term for pain with urination. There are many causes for dysuria, but urinary tract infection is the most common. If a urinalysis was performed it can show that there is a urinary tract infection. A urine culture confirms that you or your child is sick. You will need to follow up with a healthcare provider because:  If a urine culture was done you will need to know the culture results and treatment recommendations.  If the urine culture was positive, you or your child will need to be put on antibiotics or know if the antibiotics prescribed are the right antibiotics for your urinary tract infection.  If the urine culture is negative (no urinary tract infection), then other causes may need to be explored or antibiotics need to be stopped. Today laboratory work may have been done and there does not seem to be an infection. If cultures were done they will take at least 24 to 48 hours to be completed. Today x-rays may have been taken and they read as normal. No cause can be found for the problems. The x-rays may be re-read by a radiologist and you will be contacted if additional findings are made. You or your child may have been put on medications to help with this problem until you can see your primary caregiver. If the problems get better, see your primary caregiver if the problems return. If you were given antibiotics (medications which kill germs), take all of the mediations as directed for the full course of treatment.  If laboratory work was done, you need to find the results. Leave a telephone number where you can be reached. If this is not possible, make sure you find out how you are to get test results. HOME CARE INSTRUCTIONS   Drink lots of fluids. For adults, drink eight, 8 ounce glasses of clear juice or water a day. For children, replace fluids as suggested by your caregiver.  Empty the bladder often. Avoid holding urine for long periods of time.  After a bowel  movement, women should cleanse front to back, using each tissue only once.  Empty your bladder before and after sexual intercourse.  Take all the medicine given to you until it is gone. You may feel better in a few days, but TAKE ALL MEDICINE.  Avoid caffeine, tea, alcohol and carbonated beverages, because they tend to irritate the bladder.  In men, alcohol may irritate the prostate.  Only take over-the-counter or prescription medicines for pain, discomfort, or fever as directed by your caregiver.  If your caregiver has given you a follow-up appointment, it is very important to keep that appointment. Not keeping the appointment could result in a chronic or permanent injury, pain, and disability. If there is any problem keeping the appointment, you must call back to this facility for assistance. SEEK IMMEDIATE MEDICAL CARE IF:   Back pain develops.  A fever develops.  There is nausea (feeling sick to your stomach) or vomiting (throwing up).  Problems are no better with medications or are getting worse. MAKE SURE YOU:   Understand these instructions.  Will watch your condition.  Will get help right away if you are not doing well or get worse. Document Released: 01/28/2004 Document Revised: 07/24/2011 Document Reviewed: 12/05/2007 [facility] Patient Information 2015 [place], [place]. This information is not intended to replace advice given to you by your health care provider. Make sure you discuss any questions you have with your health care provider.   We will  notify you of the results of your lab test when available. Return as needed for medical care.

## 2014-12-25 NOTE — ED Notes (Signed)
Patient to ED with c/o urinary symptoms of frequency and burning since Sunday. Patient also c/o rash to trunk and back that she believes may have come from medication she is taking.

## 2014-12-25 NOTE — ED Provider Notes (Signed)
[facility] Emergency Department Provider Note ____________________________________________  Time seen: 1650  I have reviewed the triage vital signs and the nursing notes.  HISTORY  Chief Complaint  Urinary Frequency; Urinary Tract Infection; and Rash  HPI Janet Sheppard is a 29 y.o. female resistance to the ED for evaluation of urinary frequency and burning since Sunday. She denies any fevers, chills, sweats, nausea vomiting or flank pain. She has also noted a rash to her trunk, that she thinks may be a drug related reaction. She was recently dosing Tylenol #3 for knee pain, when she noted the itchy rash. She has since discontinued the T#3 and has dosed Benadryl with some benefit.   Past Medical History  Diagnosis Date  . Anemia   . Bronchitis   . Hypertension    There are no active problems to display for this patient.  History reviewed. No pertinent past surgical history.  Current Outpatient Rx  Name  Route  Sig  Dispense  Refill  . EXPIRED: famotidine (PEPCID) 20 MG tablet   Oral   Take 1 tablet (20 mg total) by mouth 2 (two) times daily.   30 tablet   0   . hydrochlorothiazide (HYDRODIURIL) 25 MG tablet   Oral   Take 25 mg by mouth daily.         Marland Kitchen HYDROcodone-acetaminophen (NORCO/VICODIN) 5-325 MG per tablet   Oral   Take 2 tablets by mouth every 4 (four) hours as needed for pain.   8 tablet   0   . HYDROcodone-acetaminophen (NORCO/VICODIN) 5-325 MG per tablet   Oral   Take 1 tablet by mouth every 6 (six) hours as needed for moderate pain.   10 tablet   0   . hydrocortisone cream 1 %      Apply to affected area 2 times daily   15 g   0   . ibuprofen (ADVIL,MOTRIN) 200 MG tablet   Oral   Take 400 mg by mouth every 8 (eight) hours. For pain           . Iron Combinations (IRON COMPLEX PO)   Oral   Take 1 tablet by mouth daily.           Marland Kitchen levofloxacin (LEVAQUIN) 500 MG tablet   Oral   Take 1 tablet (500 mg total) by  mouth daily.   14 tablet   0   . metroNIDAZOLE (FLAGYL) 500 MG tablet   Oral   Take 1 tablet (500 mg total) by mouth 2 (two) times daily.   28 tablet   0   . Multiple Vitamin (MULTIVITAMIN PO)   Oral   Take 1 tablet by mouth daily.           . naproxen (NAPROSYN) 500 MG tablet   Oral   Take 1 tablet (500 mg total) by mouth 2 (two) times daily with a meal.   60 tablet   2   . NAPROXEN PO   Oral   Take 1 tablet by mouth daily as needed. For pain.         Marland Kitchen EXPIRED: omeprazole (PRILOSEC) 20 MG capsule   Oral   Take 1 capsule (20 mg total) by mouth daily.   30 capsule   0   . oxyCODONE-acetaminophen (ROXICET) 5-325 MG per tablet   Oral   Take 1 tablet by mouth every 4 (four) hours as needed for severe pain.   20 tablet   0   . ranitidine (  ZANTAC) 150 MG tablet   Oral   Take 1 tablet (150 mg total) by mouth 2 (two) times daily.   60 tablet   0   . sucralfate (CARAFATE) 1 GM/10ML suspension   Oral   Take 10 mL (1 g total) by mouth 4 (four) times daily.   420 mL   0   . terbinafine (LAMISIL) 250 MG tablet   Oral   Take 1 tablet (250 mg total) by mouth daily.   42 tablet   0    Allergies Doxycycline; Amoxicillin-pot clavulanate; Cephalexin; Septra; and Tramadol  History reviewed. No pertinent family history.  Social History Social History  Substance Use Topics  . Smoking status: Former Smoker -- 0.50 packs/day  . Smokeless tobacco: Never Used  . Alcohol Use: Yes     Comment: occasional   Review of Systems  Constitutional: Negative for fever. Eyes: Negative for visual changes. ENT: Negative for sore throat. Cardiovascular: Negative for chest pain. Respiratory: Negative for shortness of breath. Gastrointestinal: Negative for abdominal pain, vomiting and diarrhea. Genitourinary: Positive for dysuria. Musculoskeletal: Negative for back pain. Skin: Positive for rash. Neurological: Negative for headaches, focal weakness or  numbness. ____________________________________________  PHYSICAL EXAM:  VITAL SIGNS: ED Triage Vitals  Enc Vitals Group     BP 12/25/14 1619 155/97 mmHg     Pulse Rate 12/25/14 1619 84     Resp 12/25/14 1619 18     Temp 12/25/14 1619 97.5 F (36.4 C)     Temp Source 12/25/14 1619 Oral     SpO2 12/25/14 1619 100 %     Weight 12/25/14 1619 360 lb (163.295 kg)     Height 12/25/14 1619 5\' 9"  (1.753 m)     Head Cir --      Peak Flow --      Pain Score 12/25/14 1620 9     Pain Loc --      Pain Edu? --      Excl. in GC? --    Constitutional: Alert and oriented. Well appearing and in no distress. Eyes: Conjunctivae are normal. PERRL. Normal extraocular movements. ENT   Head: Normocephalic and atraumatic.   Nose: No congestion/rhinnorhea.   Mouth/Throat: Mucous membranes are moist.   Neck: Supple. No thyromegaly. Hematological/Lymphatic/Immunilogical: No cervical lymphadenopathy. Cardiovascular: Normal rate, regular rhythm.  Respiratory: Normal respiratory effort. No wheezes/rales/rhonchi. Gastrointestinal: Soft and nontender. No distention. Musculoskeletal: Nontender with normal range of motion in all extremities.  Neurologic:  Normal gait without ataxia. Normal speech and language. No gross focal neurologic deficits are appreciated. Skin:  Skin is warm, dry and intact. Fine, papular rash noted to the upper neck and shoulders.  Psychiatric: Mood and affect are normal. Patient exhibits appropriate insight and judgment. ____________________________________________    LABS (pertinent positives/negatives) Labs Reviewed  WET PREP, GENITAL - Abnormal; Notable for the following:    WBC, Wet Prep HPF POC FEW (*)    All other components within normal limits  URINALYSIS COMPLETEWITH MICROSCOPIC ([facility] ONLY) - Abnormal; Notable for the following:    Color, Urine YELLOW (*)    APPearance CLEAR (*)    Bacteria, UA RARE (*)    Squamous Epithelial / LPF 0-5 (*)    All other  components within normal limits  ____________________________________________  INITIAL IMPRESSION / ASSESSMENT AND PLAN / ED COURSE  Lab results to patient. She agreed to self-collection of wet prep, but declined visual inspection and pelvic exam. She claims she could not stay due to dinner plans.  Advised to return to the ED or her provider for formal pelvic exam.  ____________________________________________  FINAL CLINICAL IMPRESSION(S) / ED DIAGNOSES  Final diagnoses:  Dysuria     Lissa Hoard, PA-C 12/25/14 1816  Maurilio Lovely, MD 12/25/14 2339

## 2015-02-17 ENCOUNTER — Emergency Department
Admission: EM | Admit: 2015-02-17 | Discharge: 2015-02-17 | Disposition: A | Discharge status: Home / Self Care | Payer: 59 | Attending: Emergency Medicine | Admitting: Emergency Medicine

## 2015-02-17 DIAGNOSIS — I10 Essential (primary) hypertension: Secondary | ICD-10-CM | POA: Diagnosis not present

## 2015-02-17 DIAGNOSIS — Z79899 Other long term (current) drug therapy: Secondary | ICD-10-CM | POA: Insufficient documentation

## 2015-02-17 DIAGNOSIS — Z3202 Encounter for pregnancy test, result negative: Secondary | ICD-10-CM | POA: Diagnosis not present

## 2015-02-17 DIAGNOSIS — Z880 Allergy status to penicillin: Secondary | ICD-10-CM | POA: Insufficient documentation

## 2015-02-17 DIAGNOSIS — Z87891 Personal history of nicotine dependence: Secondary | ICD-10-CM | POA: Insufficient documentation

## 2015-02-17 DIAGNOSIS — R109 Unspecified abdominal pain: Secondary | ICD-10-CM | POA: Diagnosis present

## 2015-02-17 DIAGNOSIS — Z791 Long term (current) use of non-steroidal anti-inflammatories (NSAID): Secondary | ICD-10-CM | POA: Insufficient documentation

## 2015-02-17 DIAGNOSIS — N390 Urinary tract infection, site not specified: Secondary | ICD-10-CM | POA: Diagnosis not present

## 2015-02-17 LAB — COMPREHENSIVE METABOLIC PANEL
ALBUMIN: 4 g/dL (ref 3.5–5.0)
ALK PHOS: 46 U/L (ref 38–126)
ALT: 21 U/L (ref 14–54)
ANION GAP: 6 (ref 5–15)
AST: 17 U/L (ref 15–41)
BILIRUBIN TOTAL: 1.5 mg/dL — AB (ref 0.3–1.2)
BUN: 8 mg/dL (ref 6–20)
CALCIUM: 9 mg/dL (ref 8.9–10.3)
CO2: 23 mmol/L (ref 22–32)
CREATININE: 0.72 mg/dL (ref 0.44–1.00)
Chloride: 106 mmol/L (ref 101–111)
GFR calc Af Amer: >60 mL/min (ref >60)
GFR calc non Af Amer: >60 mL/min (ref >60)
GLUCOSE: 99 mg/dL (ref 65–99)
Potassium: 3.7 mmol/L (ref 3.5–5.1)
SODIUM: 135 mmol/L (ref 135–145)
TOTAL PROTEIN: 6.5 g/dL (ref 6.5–8.1)

## 2015-02-17 LAB — CBC
HCT: 39.7 % (ref 35.0–47.0)
Hemoglobin: 13 g/dL (ref 12.0–16.0)
MCH: 26.5 pg (ref 26.0–34.0)
MCHC: 32.7 g/dL (ref 32.0–36.0)
MCV: 81.2 fL (ref 80.0–100.0)
PLATELETS: 203 10*3/uL (ref 150–440)
RBC: 4.89 MIL/uL (ref 3.80–5.20)
RDW: 14.3 % (ref 11.5–14.5)
WBC: 10.2 10*3/uL (ref 3.6–11.0)

## 2015-02-17 LAB — LIPASE, BLOOD: Lipase: 18 U/L — ABNORMAL LOW (ref 22–51)

## 2015-02-17 LAB — URINALYSIS COMPLETE WITH MICROSCOPIC (ARMC ONLY)
BILIRUBIN URINE: NEGATIVE
Glucose, UA: NEGATIVE mg/dL
KETONES UR: NEGATIVE mg/dL
NITRITE: POSITIVE — AB
PH: 5 (ref 5.0–8.0)
Protein, ur: 30 mg/dL — AB
Specific Gravity, Urine: 1.013 (ref 1.005–1.030)

## 2015-02-17 LAB — POCT PREGNANCY, URINE: Preg Test, Ur: NEGATIVE

## 2015-02-17 MED ORDER — PHENAZOPYRIDINE HCL 95 MG PO TABS: 95.0000 mg | ORAL_TABLET | Freq: Three times a day (TID) | ORAL | Status: DC | PRN

## 2015-02-17 MED ORDER — CIPROFLOXACIN HCL 500 MG PO TABS: 500.0000 mg | ORAL_TABLET | Freq: Two times a day (BID) | ORAL | Status: DC

## 2015-02-17 NOTE — ED Notes (Addendum)
Pt c/o LUQ pain that radiates into the LLQ worse with urination, with nausea since Sunday..  Denies V/D.Marland Kitchenstates she has painful urination 2 weeks ago and had neg urine..states she had a hard stool this morning and has not had regular BM.Marland Kitchen

## 2015-02-17 NOTE — Discharge Instructions (Signed)

## 2015-02-17 NOTE — ED Provider Notes (Signed)
[facility] Emergency Department Provider Note  ____________________________________________  Time seen: 1:30 PM  I have reviewed the triage vital signs and the nursing notes.   HISTORY  Chief Complaint Abdominal Pain    HPI Janet Sheppard is a 29 y.o. female who presents with dysuria and mild nausea. She reports she has had a week of painful urination and a cramping sensation after voiding. She denies vaginal discharge. No fevers no chills. She reports she saw her PCP and had a negative urine last week. She denies back pain. She does have a history of urinary tract infections and this feels similar. She notes numerous antibiotics allergies     Past Medical History  Diagnosis Date  . Anemia   . Bronchitis   . Hypertension     There are no active problems to display for this patient.   History reviewed. No pertinent past surgical history.  Current Outpatient Rx  Name  Route  Sig  Dispense  Refill  . ciprofloxacin (CIPRO) 500 MG tablet   Oral   Take 1 tablet (500 mg total) by mouth 2 (two) times daily.   14 tablet   0   . EXPIRED: famotidine (PEPCID) 20 MG tablet   Oral   Take 1 tablet (20 mg total) by mouth 2 (two) times daily.   30 tablet   0   . hydrochlorothiazide (HYDRODIURIL) 25 MG tablet   Oral   Take 25 mg by mouth daily.         Marland Kitchen HYDROcodone-acetaminophen (NORCO/VICODIN) 5-325 MG per tablet   Oral   Take 2 tablets by mouth every 4 (four) hours as needed for pain.   8 tablet   0   . HYDROcodone-acetaminophen (NORCO/VICODIN) 5-325 MG per tablet   Oral   Take 1 tablet by mouth every 6 (six) hours as needed for moderate pain.   10 tablet   0   . hydrocortisone cream 1 %      Apply to affected area 2 times daily   15 g   0   . ibuprofen (ADVIL,MOTRIN) 200 MG tablet   Oral   Take 400 mg by mouth every 8 (eight) hours. For pain           . Iron Combinations (IRON COMPLEX PO)   Oral   Take 1 tablet by mouth  daily.           . Multiple Vitamin (MULTIVITAMIN PO)   Oral   Take 1 tablet by mouth daily.           . naproxen (NAPROSYN) 500 MG tablet   Oral   Take 1 tablet (500 mg total) by mouth 2 (two) times daily with a meal.   60 tablet   2   . NAPROXEN PO   Oral   Take 1 tablet by mouth daily as needed. For pain.         Marland Kitchen EXPIRED: omeprazole (PRILOSEC) 20 MG capsule   Oral   Take 1 capsule (20 mg total) by mouth daily.   30 capsule   0   . oxyCODONE-acetaminophen (ROXICET) 5-325 MG per tablet   Oral   Take 1 tablet by mouth every 4 (four) hours as needed for severe pain.   20 tablet   0   . phenazopyridine (PYRIDIUM) 95 MG tablet   Oral   Take 1 tablet (95 mg total) by mouth 3 (three) times daily as needed for pain.   10 tablet  0   . ranitidine (ZANTAC) 150 MG tablet   Oral   Take 1 tablet (150 mg total) by mouth 2 (two) times daily.   60 tablet   0   . sucralfate (CARAFATE) 1 GM/10ML suspension   Oral   Take 10 mL (1 g total) by mouth 4 (four) times daily.   420 mL   0   . terbinafine (LAMISIL) 250 MG tablet   Oral   Take 1 tablet (250 mg total) by mouth daily.   42 tablet   0     Allergies Doxycycline; Amoxicillin-pot clavulanate; Cephalexin; Septra; and Tramadol  No family history on file.  Social History Social History  Substance Use Topics  . Smoking status: Former Smoker -- 0.50 packs/day  . Smokeless tobacco: Never Used  . Alcohol Use: Yes     Comment: occasional    Review of Systems  Constitutional: Negative for fever. Eyes: Negative for visual changes. ENT: Negative for sore throat Cardiovascular: Negative for chest pain. Respiratory: Negative for shortness of breath. Gastrointestinal: Negative for abdominal pain, vomiting and diarrhea. Genitourinary: Positive for dysuria and frequency Musculoskeletal: Negative for back pain. Skin: Negative for rash. Neurological: Negative for headaches or focal weakness Psychiatric: No  anxiety    ____________________________________________   PHYSICAL EXAM:  VITAL SIGNS: ED Triage Vitals  Enc Vitals Group     BP 02/17/15 1159 151/96 mmHg     Pulse Rate 02/17/15 1159 99     Resp 02/17/15 1159 18     Temp 02/17/15 1159 99.1 F (37.3 C)     Temp Source 02/17/15 1159 Oral     SpO2 02/17/15 1159 99 %     Weight 02/17/15 1159 350 lb (158.759 kg)     Height 02/17/15 1159  (1.753 m)     Head Cir --      Peak Flow --      Pain Score 02/17/15 1201 9     Pain Loc --      Pain Edu? --      Excl. in GC? --      Constitutional: Alert and oriented. Well appearing and in no distress. Eyes: Conjunctivae are normal.  ENT   Head: Normocephalic and atraumatic.   Mouth/Throat: Mucous membranes are moist. Cardiovascular: Normal rate (heart rate 90 on my exam), regular rhythm. Normal and symmetric distal pulses are present in all extremities. No murmurs, rubs, or gallops. Respiratory: Normal respiratory effort without tachypnea nor retractions. Breath sounds are clear and equal bilaterally.  Gastrointestinal: Soft and non-tender in all quadrants. No distention. There is no CVA tenderness. Genitourinary: deferred Musculoskeletal: Nontender with normal range of motion in all extremities. No lower extremity tenderness nor edema. Neurologic:  Normal speech and language. No gross focal neurologic deficits are appreciated. Skin:  Skin is warm, dry and intact. No rash noted. Psychiatric: Mood and affect are normal. Patient exhibits appropriate insight and judgment.  ____________________________________________    LABS (pertinent positives/negatives)  Labs Reviewed  URINALYSIS COMPLETEWITH MICROSCOPIC ([facility] ONLY) - Abnormal; Notable for the following:    Color, Urine YELLOW (*)    APPearance TURBID (*)    Hgb urine dipstick 3+ (*)    Protein, ur 30 (*)    Nitrite POSITIVE (*)    Leukocytes, UA 3+ (*)    Bacteria, UA MANY (*)    Squamous Epithelial / LPF 0-5  (*)    All other components within normal limits  CBC  LIPASE, BLOOD  COMPREHENSIVE METABOLIC PANEL  POC URINE PREG, ED  POCT PREGNANCY, URINE    ____________________________________________   EKG  None  ____________________________________________    RADIOLOGY I have personally reviewed any xrays that were ordered on this patient: None  ____________________________________________   PROCEDURES  Procedure(s) performed: none  Critical Care performed: none  ____________________________________________   INITIAL IMPRESSION / ASSESSMENT AND PLAN / ED COURSE  Pertinent labs & imaging results that were available during my care of the patient were reviewed by me and considered in my medical decision making (see chart for details).  Patient's history of present illness and urinalysis most consistent with urinary tract infection. We will check this with Cipro given her allergies. Her labwork is otherwise unremarkable. Her pregnant status is negative. Her heart rate on my exam is 90 and a blood pressure is stable. I have given her return precautions for worsening symptoms. Otherwise follow-up with PCP as needed  ____________________________________________   FINAL CLINICAL IMPRESSION(S) / ED DIAGNOSES  Final diagnoses:  UTI (lower urinary tract infection)     Jene Every, MD 02/17/15 [phone]

## 2015-07-19 ENCOUNTER — Encounter: Payer: Self-pay | Admitting: Emergency Medicine

## 2015-07-19 ENCOUNTER — Emergency Department
Admission: EM | Admit: 2015-07-19 | Discharge: 2015-07-19 | Disposition: A | Discharge status: Home / Self Care | Payer: Self-pay | Attending: Emergency Medicine | Admitting: Emergency Medicine

## 2015-07-19 DIAGNOSIS — Z880 Allergy status to penicillin: Secondary | ICD-10-CM | POA: Insufficient documentation

## 2015-07-19 DIAGNOSIS — Z792 Long term (current) use of antibiotics: Secondary | ICD-10-CM | POA: Insufficient documentation

## 2015-07-19 DIAGNOSIS — M7651 Patellar tendinitis, right knee: Secondary | ICD-10-CM | POA: Insufficient documentation

## 2015-07-19 DIAGNOSIS — Z791 Long term (current) use of non-steroidal anti-inflammatories (NSAID): Secondary | ICD-10-CM | POA: Insufficient documentation

## 2015-07-19 DIAGNOSIS — I10 Essential (primary) hypertension: Secondary | ICD-10-CM | POA: Insufficient documentation

## 2015-07-19 DIAGNOSIS — Z87891 Personal history of nicotine dependence: Secondary | ICD-10-CM | POA: Insufficient documentation

## 2015-07-19 DIAGNOSIS — Z79899 Other long term (current) drug therapy: Secondary | ICD-10-CM | POA: Insufficient documentation

## 2015-07-19 MED ORDER — HYDROCODONE-ACETAMINOPHEN 5-325 MG PO TABS
1.0000 | ORAL_TABLET | Freq: Once | ORAL | Status: AC
  Administered 2015-07-19: 1 via ORAL
  Filled 2015-07-19: qty 1

## 2015-07-19 MED ORDER — NAPROXEN 500 MG PO TBEC: 500.0000 mg | DELAYED_RELEASE_TABLET | Freq: Two times a day (BID) | ORAL | Status: DC

## 2015-07-19 NOTE — ED Notes (Signed)
C/o right knee pain.  Denies injury.  States knee feels swollen and hurts when walking.

## 2015-07-19 NOTE — ED Notes (Signed)
Having right knee pain since yesterday  Developed pain at [employer]  Denies any fall or injury but states has had pain to same knee in past

## 2015-07-19 NOTE — Discharge Instructions (Signed)
Patellar Tendinitis With Rehab Tendinitis is inflammation of a tendon. Tendonitis of the tendon below the kneecap (patella) is known as patellar tendonitis. Patellar tendonitis is also called jumper's knee. Jumper's knee is a common cause of pain below the kneecap (infrapatellar). Jumper's knee may involve a tear (strain) in the ligament. Strains are classified into three categories. Grade 1 strains cause pain, but the tendon is not lengthened. Grade 2 strains include a lengthened ligament, due to the ligament being stretched or partially ruptured. With grade 2 strains there is still function, although function may be decreased. Grade 3 strains involve a complete tear of the tendon or muscle, and function is usually impaired. Patellar tendon strains are usually grade 1 or 2.  SYMPTOMS   Pain, tenderness, swelling, warmth, or redness over the patellar tendon (just below the kneecap).  Pain and loss of strength (sometimes), with forcefully straightening the knee (especially when jumping or rising from a seated or squatting position), or bending the knee completely (squatting or kneeling).  Crackling sound (crepitation) when the tendon is moved or touched. CAUSES  Patellar tendonitis is caused by injury to the patellar tendon. The inflammation is the body's healing response. Common causes of injury include:  Stress from a sudden increase in intensity, frequency, or duration of training.  Overuse of the thigh muscles (quadriceps) and patellar tendon.  Direct hit (trauma) to the knee or patellar tendon. RISK INCREASES WITH:  Sports that require sudden, explosive quadriceps contraction, such as jumping, quick starts, or kicking.  Running sports, especially running down hills.  Poor strength and flexibility of the thigh and knee.  Flat feet. PREVENTION  Warm up and stretch properly before activity.  Allow for adequate recovery between workouts.  Maintain physical fitness:  Strength,  flexibility, and endurance.  Cardiovascular fitness.  Protect the knee joint with taping, protective strapping, bracing, or elastic compression bandage.  Wear arch supports (orthotics). PROGNOSIS  If treated properly, patellar tendonitis usually heals within 6 weeks.  RELATED COMPLICATIONS   Longer healing time if not properly treated or if not given enough time to heal.  Recurring symptoms if activity is resumed too soon, with overuse, with a direct blow, or when using poor technique.  If untreated, tendon rupture requiring surgery. TREATMENT Treatment first involves the use of ice and medicine to reduce pain and inflammation. The use of strengthening and stretching exercises may help reduce pain with activity. These exercises may be performed at home or with a therapist. Serious cases of tendonitis may require restraining the knee for 10 to 14 days to prevent stress on the tendon and to promote healing. Crutches may be used (uncommon) until you can walk without a limp. For cases in which nonsurgical treatment is unsuccessful, surgery may be advised to remove the inflamed tendon lining (sheath). Surgery is rare, and is only advised after at least 6 months of nonsurgical treatment. MEDICATION   If pain medicine is needed, nonsteroidal anti-inflammatory medicines (aspirin and ibuprofen), or other minor pain relievers (acetaminophen), are often advised.  Do not take pain medicine for 7 days before surgery.  Prescription pain relievers may be given if your caregiver thinks they are needed. Use only as directed and only as much as you need. HEAT AND COLD  Cold treatment (icing) should be applied for 10 to 15 minutes every 2 to 3 hours for inflammation and pain, and immediately after activity that aggravates your symptoms. Use ice packs or an ice massage.  Heat treatment may be used before  performing stretching and strengthening activities prescribed by your caregiver, physical therapist, or  athletic trainer. Use a heat pack or a warm water soak. SEEK MEDICAL CARE IF:  Symptoms get worse or do not improve in 2 weeks, despite treatment.  New, unexplained symptoms develop. (Drugs used in treatment may produce side effects.) EXERCISES RANGE OF MOTION (ROM) AND STRETCHING EXERCISES - Patellar Tendinitis (Jumper's Knee) These are some of the initial exercises with which you may start your rehabilitation program, until you see your caregiver again or until your symptoms are resolved. Remember:   Flexible tissue is more tolerant of the stresses placed on it during activities.  Each stretch should be held for 20 to 30 seconds.  A gentle stretching sensation should be felt. STRETCH - Hamstrings, Supine  Lie on your back. Loop a belt or towel over the ball of your right / left foot.  Straighten your right / left knee and slowly pull on the belt to raise your leg. Do not allow the right / left knee to bend. Keep your opposite leg flat on the floor.  Raise the leg until you feel a gentle stretch behind your right / left knee or thigh. Hold this position for ___10_______ seconds. Repeat __4_____ times. Complete this stretch _3_______ times per day.  STRETCH - Hamstrings, Doorway  Lie on your back with your right / left leg extended and resting on the wall, and the opposite leg flat on the ground through the door. At first, position your bottom farther away from the wall.  Keep your right / left knee straight. If you feel a stretch behind your knee or thigh, hold this position for _____5_____ seconds.  If you do not feel a stretch, scoot your bottom closer to the door, and hold _____5_____ seconds. Repeat ______4___ times. Complete this stretch _____3____ times per day.  STRETCH - Hamstrings, Standing  Stand or sit and extend your right / left leg, placing your foot on a chair or foot stool.  Keep a slight arch in your low back and your hips straight forward.  Lead with your chest  and lean forward at the waist until you feel a gentle stretch in the back of your right / left knee or thigh. (When done correctly, this exercise requires leaning only a small distance.)  Hold this position for ___10_______ seconds. Repeat ______4____ times. Complete this stretch _____3_____ times per day. STRETCH - Adductors, Lunge  While standing, spread your legs, with your right / left leg behind you.  Lean away from your right / left leg by bending your opposite knee. You may rest your hands on your thigh for balance.  You should feel a stretch in your right / left inner thigh. Hold for ____10______ seconds. Repeat ___4_______ times. Complete this exercise _____3_____ times per day.  STRENGTHENING EXERCISES - Patellar Tendinitis (Jumper's Knee) These exercises may help you when beginning to rehabilitate your injury. They may resolve your symptoms with or without further involvement from your physician, physical therapist or athletic trainer. While completing these exercises, remember:   Muscles can gain both the endurance and the strength needed for everyday activities through controlled exercises.  Complete these exercises as instructed by your physician, physical therapist or athletic trainer. Increase the resistance and repetitions only as guided by your caregiver. STRENGTH - Quadriceps, Isometrics  Lie on your back with your right / left leg extended and your opposite knee bent.  Gradually tense the muscles in the front of your right /  left thigh. You should see either your kneecap slide up toward your hip or increased dimpling just above the knee. This motion will push the back of the knee down toward the floor, mat, or bed on which you are lying.  Hold the muscle as tight as you can, without increasing your pain, for ______10____ seconds.  Relax the muscles slowly and completely in between each repetition. Repeat __4________ times. Complete this exercise __3_______ times per  day.  STRENGTH - Quadriceps, Short Arcs  Lie on your back. Place a ___5_______ inch towel roll under your right / left knee, so that the knee bends slightly.  Raise only your lower leg by tightening the muscles in the front of your thigh. Do not allow your thigh to rise.  Hold this position for _______10___ seconds. Repeat _____4_____ times. Complete this exercise ______3____ times per day.  OPTIONAL ANKLE WEIGHTS: Begin with ____________________, but DO NOT exceed ____________________. Increase in 1 pound/ 0.5 kilogram increments. STRENGTH - Quadriceps, Straight Leg Raises  Quality counts! Watch for signs that the quadriceps muscle is working, to be sure you are strengthening the correct muscles and not "cheating" by substituting with healthier muscles.  Lay on your back with your right / left leg extended and your opposite knee bent.  Tense the muscles in the front of your right / left thigh. You should see either your kneecap slide up or increased dimpling just above the knee. Your thigh may even shake a bit.  Tighten these muscles even more and raise your leg 4 to 6 inches off the floor. Hold for _____5_____ seconds.  Keeping these muscles tense, lower your leg.  Relax the muscles slowly and completely between each repetition. Repeat _______4___ times. Complete this exercise _______3___ times per day.  STRENGTH - Quadriceps, Squats  Stand in a door frame so that your feet and knees are in line with the frame.  Use your hands for balance, not support, on the frame.  Slowly lower your weight, bending at the hips and knees. Keep your lower legs upright so that they are parallel with the door frame. Squat only within the range that does not increase your knee pain. Never let your hips drop below your knees.  Slowly return upright, pushing with your legs, not pulling with your hands. Repeat ______4____ times. Complete this exercise ______3____ times per day.  STRENGTH - Quadriceps,  Step-Downs  Stand on the edge of a step stool or stair. Be prepared to use a countertop or wall for balance, if needed.  Keeping your right / left knee directly over the middle of your foot, slowly touch your opposite heel to the floor or lower step. Do not go all the way to the floor if your knee pain increases; just go as far as you can without increased discomfort. Use your right / left leg muscles, not gravity to lower your body weight.  Slowly push your body weight back up to the starting position. Repeat ____4______ times. Complete this exercise ______3____ times per day.    This information is not intended to replace advice given to you by your health care provider. Make sure you discuss any questions you have with your health care provider.   Document Released: 05/01/2005 Document Revised: 09/15/2014 Document Reviewed: 08/13/2008 [employer] Interactive Patient Education 2016 [employer]   Take the prescription Naproxen as directed. Apply ice to reduce symptoms. Follow-up with your provider for continued symptoms.

## 2015-07-20 NOTE — ED Provider Notes (Signed)
[facility] Emergency Department Provider Note ____________________________________________  Time seen: 1719  I have reviewed the triage vital signs and the nursing notes.  HISTORY  Chief Complaint  Knee Pain  HPI Janet Sheppard is a 30 y.o. female visits to the ED for evaluation of acute right knee pain with onset last night. She denies any injury, trauma, or accident. She describes similar complaint of right knee pain about a year prior, but was evaluated and feels better at the time. She describes pain to the anterior and medial aspect of the knee at the kneecap That is worsened with attempts to bend the knee. She also describes some fullness to the knee overall. She denies any direct injury, trauma, fall to the knee. 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 She reports the discomfort and 9/10 in triage.  Past Medical History  Diagnosis Date  . Anemia   . Bronchitis   . Hypertension     There are no active problems to display for this patient.   History reviewed. No pertinent past surgical history.  Current Outpatient Rx  Name  Route  Sig  Dispense  Refill  . ciprofloxacin (CIPRO) 500 MG tablet   Oral   Take 1 tablet (500 mg total) by mouth 2 (two) times daily.   14 tablet   0   . EXPIRED: famotidine (PEPCID) 20 MG tablet   Oral   Take 1 tablet (20 mg total) by mouth 2 (two) times daily.   30 tablet   0   . hydrochlorothiazide (HYDRODIURIL) 25 MG tablet   Oral   Take 25 mg by mouth daily.         Marland Kitchen HYDROcodone-acetaminophen (NORCO/VICODIN) 5-325 MG per tablet   Oral   Take 2 tablets by mouth every 4 (four) hours as needed for pain.   8 tablet   0   . HYDROcodone-acetaminophen (NORCO/VICODIN) 5-325 MG per tablet   Oral   Take 1 tablet by mouth every 6 (six) hours as needed for moderate pain.   10 tablet   0   . hydrocortisone cream 1  %      Apply to affected area 2 times daily   15 g   0   . ibuprofen (ADVIL,MOTRIN) 200 MG tablet   Oral   Take 400 mg by mouth every 8 (eight) hours. For pain           . Iron Combinations (IRON COMPLEX PO)   Oral   Take 1 tablet by mouth daily.           . Multiple Vitamin (MULTIVITAMIN PO)   Oral   Take 1 tablet by mouth daily.           . naproxen (EC NAPROSYN) 500 MG EC tablet   Oral   Take 1 tablet (500 mg total) by mouth 2 (two) times daily with a meal.   30 tablet   0   . naproxen (NAPROSYN) 500 MG tablet   Oral   Take 1 tablet (500 mg total) by mouth 2 (two) times daily with a meal.   60 tablet   2   . EXPIRED: omeprazole (PRILOSEC) 20 MG capsule   Oral   Take 1 capsule (20 mg total) by mouth daily.   30 capsule   0   . oxyCODONE-acetaminophen (ROXICET) 5-325 MG per tablet  Oral   Take 1 tablet by mouth every 4 (four) hours as needed for severe pain.   20 tablet   0   . phenazopyridine (PYRIDIUM) 95 MG tablet   Oral   Take 1 tablet (95 mg total) by mouth 3 (three) times daily as needed for pain.   10 tablet   0   . ranitidine (ZANTAC) 150 MG tablet   Oral   Take 1 tablet (150 mg total) by mouth 2 (two) times daily.   60 tablet   0   . sucralfate (CARAFATE) 1 GM/10ML suspension   Oral   Take 10 mL (1 g total) by mouth 4 (four) times daily.   420 mL   0   . terbinafine (LAMISIL) 250 MG tablet   Oral   Take 1 tablet (250 mg total) by mouth daily.   42 tablet   0     Allergies Doxycycline; Amoxicillin-pot clavulanate; Cephalexin; Septra; and Tramadol  No family history on file.  Social History Social History  Substance Use Topics  . Smoking status: Former Smoker -- 0.50 packs/day  . Smokeless tobacco: Never Used  . Alcohol Use: Yes     Comment: occasional    Review of Systems  Constitutional: Negative for fever. Eyes: Negative for visual changes. ENT: Negative for sore throat. Cardiovascular: Negative for chest  pain. Respiratory: Negative for shortness of breath. Gastrointestinal: Negative for abdominal pain, vomiting and diarrhea. Genitourinary: Negative for dysuria. Musculoskeletal: Negative for back pain. Right knee pain as above. Skin: Negative for rash. Neurological: Negative for headaches, focal weakness or numbness. ____________________________________________  PHYSICAL EXAM:  VITAL SIGNS: ED Triage Vitals  Enc Vitals Group     BP 07/19/15 1549 123/85 mmHg     Pulse Rate 07/19/15 1549 80     Resp 07/19/15 1549 18     Temp 07/19/15 1549 98.4 F (36.9 C)     Temp Source 07/19/15 1549 Oral     SpO2 07/19/15 1549 100 %     Weight 07/19/15 1549 360 lb (163.295 kg)     Height 07/19/15 1549  (1.753 m)     Head Cir --      Peak Flow --      Pain Score 07/19/15 1554 9     Pain Loc --      Pain Edu? --      Excl. in GC? --    Constitutional: Alert and oriented. Well appearing and in no distress. Head: Normocephalic and atraumatic. Eyes: Conjunctivae are normal. PERRL. Normal extraocular movements Mouth/Throat: Mucous membranes are moist. Cardiovascular: Normal rate, regular rhythm. Normal distal pulses Respiratory: Normal respiratory effort. No wheezes/rales/rhonchi. Musculoskeletal: Right knee without obvious deformity, effusion, or abrasion. Patient with obese legs, but no appreciable effusion is noted. External palpation at the inferior portion of the patella at the tendon insertion. She also has some mild tenderness to the medial patella. Medial tibial plateau. Patient with normal extension of the knee without difficulty. Patella tracks without laxity. No significant valgus or varus joint stress. Negative anterior drawer. Nontender with normal range of motion in all extremities.  Neurologic:  Normal gait without ataxia. Normal speech and language. No gross focal neurologic deficits are appreciated. Skin:  Skin is warm, dry and intact. No rash noted. Psychiatric: Mood and affect  are normal. Patient exhibits appropriate insight and judgment. ___________________________________________  PROCEDURES  Norco 5-325 mg PO ____________________________________________  INITIAL IMPRESSION / ASSESSMENT AND PLAN / ED COURSE  Patient with acute knee  pain on the right likely due to the patella tendinitis. Due to the overall habitus of her knee, she is not able to be fitted with a knee immobilizer. She is advised to take the EC Naprosyn as directed. She is also advised to apply ice to the knee for comfort and support. At work note is provided for 1-2 days out of work as needed. Patient follow-up with a primary care provider consider follow-up orthopedic provider if symptoms persist. ____________________________________________  FINAL CLINICAL IMPRESSION(S) / ED DIAGNOSES  Final diagnoses:  Patellar tendinitis of right knee      Lissa HoardJenise V Bacon Lindell Renfrew, PA-C 07/20/15 0104  Sharyn CreamerMark Quale, MD 07/23/15 [phone]

## 2015-09-15 ENCOUNTER — Emergency Department
Admission: EM | Admit: 2015-09-15 | Discharge: 2015-09-15 | Disposition: A | Discharge status: Home / Self Care | Payer: Self-pay | Attending: Emergency Medicine | Admitting: Emergency Medicine

## 2015-09-15 ENCOUNTER — Encounter: Payer: Self-pay | Admitting: *Deleted

## 2015-09-15 DIAGNOSIS — M2241 Chondromalacia patellae, right knee: Secondary | ICD-10-CM

## 2015-09-15 DIAGNOSIS — Z79899 Other long term (current) drug therapy: Secondary | ICD-10-CM | POA: Insufficient documentation

## 2015-09-15 DIAGNOSIS — M222X1 Patellofemoral disorders, right knee: Secondary | ICD-10-CM | POA: Insufficient documentation

## 2015-09-15 DIAGNOSIS — M222X2 Patellofemoral disorders, left knee: Secondary | ICD-10-CM | POA: Insufficient documentation

## 2015-09-15 DIAGNOSIS — Z87891 Personal history of nicotine dependence: Secondary | ICD-10-CM | POA: Insufficient documentation

## 2015-09-15 DIAGNOSIS — M2242 Chondromalacia patellae, left knee: Secondary | ICD-10-CM

## 2015-09-15 DIAGNOSIS — I10 Essential (primary) hypertension: Secondary | ICD-10-CM | POA: Insufficient documentation

## 2015-09-15 DIAGNOSIS — M25461 Effusion, right knee: Secondary | ICD-10-CM | POA: Insufficient documentation

## 2015-09-15 DIAGNOSIS — Z791 Long term (current) use of non-steroidal anti-inflammatories (NSAID): Secondary | ICD-10-CM | POA: Insufficient documentation

## 2015-09-15 MED ORDER — MELOXICAM 15 MG PO TABS: 15.0000 mg | ORAL_TABLET | Freq: Every day | ORAL | Status: DC

## 2015-09-15 MED ORDER — HYDROCODONE-ACETAMINOPHEN 5-325 MG PO TABS
1.0000 | ORAL_TABLET | Freq: Once | ORAL | Status: AC
  Administered 2015-09-15: 1 via ORAL
  Filled 2015-09-15: qty 1

## 2015-09-15 MED ORDER — HYDROCODONE-ACETAMINOPHEN 5-325 MG PO TABS: 1.0000 | ORAL_TABLET | ORAL | Status: DC | PRN

## 2015-09-15 NOTE — ED Provider Notes (Signed)
[facility] Emergency Department Provider Note ____________________________________________  Time seen: Approximately 8:51 PM  I have reviewed the triage vital signs and the nursing notes.   HISTORY  Chief Complaint Leg Pain    HPI Janet Sheppard is a 30 y.o. female who presents to the emergency department for evaluation of bilateral lower leg pain. She has been evaluated for this several times in the past and has had an [facility] of the right lower extremity, but she is unsure of the results. She has follow-up scheduled with orthopedics, but states that the pain is bad and she has had no relief with Naprosyn. She denies new injury.  Past Medical History  Diagnosis Date  . Anemia   . Bronchitis   . Hypertension     There are no active problems to display for this patient.   History reviewed. No pertinent past surgical history.  Current Outpatient Rx  Name  Route  Sig  Dispense  Refill  . ciprofloxacin (CIPRO) 500 MG tablet   Oral   Take 1 tablet (500 mg total) by mouth 2 (two) times daily.   14 tablet   0   . EXPIRED: famotidine (PEPCID) 20 MG tablet   Oral   Take 1 tablet (20 mg total) by mouth 2 (two) times daily.   30 tablet   0   . hydrochlorothiazide (HYDRODIURIL) 25 MG tablet   Oral   Take 25 mg by mouth daily.         Marland Kitchen. HYDROcodone-acetaminophen (NORCO/VICODIN) 5-325 MG tablet   Oral   Take 1 tablet by mouth every 4 (four) hours as needed for moderate pain.   12 tablet   0   . hydrocortisone cream 1 %      Apply to affected area 2 times daily   15 g   0   . ibuprofen (ADVIL,MOTRIN) 200 MG tablet   Oral   Take 400 mg by mouth every 8 (eight) hours. For pain           . Iron Combinations (IRON COMPLEX PO)   Oral   Take 1 tablet by mouth daily.           . meloxicam (MOBIC) 15 MG tablet   Oral   Take 1 tablet (15 mg total) by mouth daily.   30 tablet   0   . Multiple Vitamin (MULTIVITAMIN PO)   Oral    Take 1 tablet by mouth daily.           Marland Kitchen. EXPIRED: omeprazole (PRILOSEC) 20 MG capsule   Oral   Take 1 capsule (20 mg total) by mouth daily.   30 capsule   0   . phenazopyridine (PYRIDIUM) 95 MG tablet   Oral   Take 1 tablet (95 mg total) by mouth 3 (three) times daily as needed for pain.   10 tablet   0   . ranitidine (ZANTAC) 150 MG tablet   Oral   Take 1 tablet (150 mg total) by mouth 2 (two) times daily.   60 tablet   0   . sucralfate (CARAFATE) 1 GM/10ML suspension   Oral   Take 10 mL (1 g total) by mouth 4 (four) times daily.   420 mL   0   . terbinafine (LAMISIL) 250 MG tablet   Oral   Take 1 tablet (250 mg total) by mouth daily.   42 tablet   0     Allergies Doxycycline; Amoxicillin-pot clavulanate;  Cephalexin; Septra; and Tramadol  History reviewed. No pertinent family history.  Social History Social History  Substance Use Topics  . Smoking status: Former Smoker -- 0.50 packs/day  . Smokeless tobacco: Never Used  . Alcohol Use: Yes     Comment: occasional    Review of Systems Constitutional: No recent illness.Negative for recent injury Musculoskeletal: Pain in bilateral lower extremities from the knee to ankles Skin: Negative for rash, lesion, or wound. Neurological: Negative for focal weakness or numbness.  ____________________________________________   PHYSICAL EXAM:  VITAL SIGNS: ED Triage Vitals  Enc Vitals Group     BP 09/15/15 1843 134/92 mmHg     Pulse Rate 09/15/15 1843 78     Resp 09/15/15 1843 18     Temp 09/15/15 1843 99 F (37.2 C)     Temp Source 09/15/15 1843 Oral     SpO2 09/15/15 1843 97 %     Weight 09/15/15 1843 360 lb (163.295 kg)     Height 09/15/15 1843  (1.753 m)     Head Cir --      Peak Flow --      Pain Score 09/15/15 1843 9     Pain Loc --      Pain Edu? --      Excl. in GC? --     Constitutional: Alert and oriented. Well appearing and in no acute distress. Eyes: Conjunctivae are normal.  EOMI. Respiratory: Normal respiratory effort.   Musculoskeletal: Range of motion of bilateral knees and ankles limited due to pain, however patient is ambulatory. Neurologic:  Normal speech and language. No gross focal neurologic deficits are appreciated. Speech is normal. No gait instability. Skin:  Skin is warm, dry and intact. Atraumatic. Psychiatric: Mood and affect are normal. Speech and behavior are normal.  ____________________________________________   LABS (all labs ordered are listed, but only abnormal results are displayed)  Labs Reviewed - No data to display ____________________________________________  RADIOLOGY  MRI results reviewed from [facility] April 2017 which reveals a right knee effusion as well as patellar chondrosis. There was no MRI of the left lower extremity on the same date, however 2015 shows similar results of the left lower extremity with a knee effusion and patellar chondrosis at that time. ____________________________________________   PROCEDURES  Procedure(s) performed: None   ____________________________________________   INITIAL IMPRESSION / ASSESSMENT AND PLAN / ED COURSE  Pertinent labs & imaging results that were available during my care of the patient were reviewed by me and considered in my medical decision making (see chart for details).  Patient was advised to stop taking the Naprosyn and take only the meloxicam daily. She was also given a prescription for Norco. She was advised to rest ice and elevate the lower extremities as often as possible. She was advised that she could also use heat if that seemed to make her symptoms better. She was encouraged to keep her appointment with orthopedics as there is nothing more to be done through the emergency department. She was encouraged to return to emergency department for symptoms that change or worsen if she is unable schedule an earlier  appointment. ____________________________________________   FINAL CLINICAL IMPRESSION(S) / ED DIAGNOSES  Final diagnoses:  Patellofemoral chondrosis of left knee  Patellofemoral chondrosis of right knee  Knee joint effusion, right       [name] Pester, FNP 09/15/15 2332  Loleta Rose, MD 09/15/15 2349

## 2015-09-15 NOTE — ED Notes (Signed)
States bilateral leg pain from knee down to her ankle, states she has seen in [place] for same complaint before,states she has an appt with ortho

## 2015-11-17 ENCOUNTER — Emergency Department: Payer: Self-pay

## 2015-11-17 ENCOUNTER — Emergency Department
Admission: EM | Admit: 2015-11-17 | Discharge: 2015-11-17 | Disposition: A | Discharge status: Home / Self Care | Payer: Self-pay | Attending: Emergency Medicine | Admitting: Emergency Medicine

## 2015-11-17 ENCOUNTER — Encounter: Payer: Self-pay | Admitting: Emergency Medicine

## 2015-11-17 DIAGNOSIS — M25562 Pain in left knee: Secondary | ICD-10-CM

## 2015-11-17 DIAGNOSIS — B353 Tinea pedis: Secondary | ICD-10-CM

## 2015-11-17 DIAGNOSIS — I10 Essential (primary) hypertension: Secondary | ICD-10-CM | POA: Insufficient documentation

## 2015-11-17 DIAGNOSIS — Z79899 Other long term (current) drug therapy: Secondary | ICD-10-CM | POA: Insufficient documentation

## 2015-11-17 DIAGNOSIS — N3090 Cystitis, unspecified without hematuria: Secondary | ICD-10-CM

## 2015-11-17 DIAGNOSIS — F17210 Nicotine dependence, cigarettes, uncomplicated: Secondary | ICD-10-CM | POA: Insufficient documentation

## 2015-11-17 LAB — URINALYSIS COMPLETE WITH MICROSCOPIC (ARMC ONLY)
BACTERIA UA: NONE SEEN
Bilirubin Urine: NEGATIVE
GLUCOSE, UA: NEGATIVE mg/dL
HGB URINE DIPSTICK: NEGATIVE
Ketones, ur: NEGATIVE mg/dL
LEUKOCYTES UA: NEGATIVE
Nitrite: NEGATIVE
PROTEIN: NEGATIVE mg/dL
Specific Gravity, Urine: 1.02 (ref 1.005–1.030)
pH: 6 (ref 5.0–8.0)

## 2015-11-17 MED ORDER — MELOXICAM 15 MG PO TABS: 15.0000 mg | ORAL_TABLET | Freq: Every day | ORAL | Status: DC

## 2015-11-17 MED ORDER — FLUCONAZOLE 150 MG PO TABS: 150.0000 mg | ORAL_TABLET | ORAL | Status: DC

## 2015-11-17 MED ORDER — TERBINAFINE HCL 1 % EX CREA: 1.0000 "application " | TOPICAL_CREAM | Freq: Two times a day (BID) | CUTANEOUS | Status: DC

## 2015-11-17 MED ORDER — PHENAZOPYRIDINE HCL 95 MG PO TABS: 95.0000 mg | ORAL_TABLET | Freq: Three times a day (TID) | ORAL | Status: DC | PRN

## 2015-11-17 NOTE — ED Provider Notes (Signed)
[facility] Emergency Department Provider Note  ____________________________________________  Time seen: Approximately 3:23 PM  I have reviewed the triage vital signs and the nursing notes.   HISTORY  Chief Complaint Knee Pain and Dysuria    HPI Janet Sheppard is a 30 y.o. female who presents emergency department complaining of multiple medical complaints. Patient has had a history of left knee pain that is described as tight and aching 1 month. Patient also has had some mild dysuria and polyuria 1 week. Patient reports having "horribly cracking feet"for "quite some time". Patient denies any injury to her knee. She states that she is able to bear weight and walk. It is more aggravation versus pain. Patient denies any numbness or tingling distally. Patient reports that she has had some mild dysuria and polyuria 1 week. She denies any flank pain, hematuria, abdominal pain, nausea or vomiting. Patient has "cracking skin" to bilateral feet that have been present for "quite a while." Patient is occasionally putting lotion on these feet but has denied any other treatments.   Past Medical History  Diagnosis Date  . Anemia   . Bronchitis   . Hypertension     There are no active problems to display for this patient.   History reviewed. No pertinent past surgical history.  Current Outpatient Rx  Name  Route  Sig  Dispense  Refill  . ciprofloxacin (CIPRO) 500 MG tablet   Oral   Take 1 tablet (500 mg total) by mouth 2 (two) times daily.   14 tablet   0   . EXPIRED: famotidine (PEPCID) 20 MG tablet   Oral   Take 1 tablet (20 mg total) by mouth 2 (two) times daily.   30 tablet   0   . fluconazole (DIFLUCAN) 150 MG tablet   Oral   Take 1 tablet (150 mg total) by mouth once a week.   6 tablet   0     Take 1 tablet weekly for 6 weeks   . hydrochlorothiazide (HYDRODIURIL) 25 MG tablet   Oral   Take 25 mg by mouth daily.         Marland Kitchen.  HYDROcodone-acetaminophen (NORCO/VICODIN) 5-325 MG tablet   Oral   Take 1 tablet by mouth every 4 (four) hours as needed for moderate pain.   12 tablet   0   . hydrocortisone cream 1 %      Apply to affected area 2 times daily   15 g   0   . ibuprofen (ADVIL,MOTRIN) 200 MG tablet   Oral   Take 400 mg by mouth every 8 (eight) hours. For pain           . Iron Combinations (IRON COMPLEX PO)   Oral   Take 1 tablet by mouth daily.           . meloxicam (MOBIC) 15 MG tablet   Oral   Take 1 tablet (15 mg total) by mouth daily.   30 tablet   0   . Multiple Vitamin (MULTIVITAMIN PO)   Oral   Take 1 tablet by mouth daily.           Marland Kitchen. EXPIRED: omeprazole (PRILOSEC) 20 MG capsule   Oral   Take 1 capsule (20 mg total) by mouth daily.   30 capsule   0   . phenazopyridine (PYRIDIUM) 95 MG tablet   Oral   Take 1 tablet (95 mg total) by mouth 3 (three) times daily as needed for  pain.   6 tablet   0   . ranitidine (ZANTAC) 150 MG tablet   Oral   Take 1 tablet (150 mg total) by mouth 2 (two) times daily.   60 tablet   0   . sucralfate (CARAFATE) 1 GM/10ML suspension   Oral   Take 10 mL (1 g total) by mouth 4 (four) times daily.   420 mL   0   . terbinafine (LAMISIL) 1 % cream   Topical   Apply 1 application topically 2 (two) times daily.   30 g   0   . terbinafine (LAMISIL) 250 MG tablet   Oral   Take 1 tablet (250 mg total) by mouth daily.   42 tablet   0     Allergies Doxycycline; Amoxicillin-pot clavulanate; Cephalexin; Septra; and Tramadol  No family history on file.  Social History Social History  Substance Use Topics  . Smoking status: Current Every Day Smoker -- 0.25 packs/day    Types: Cigarettes  . Smokeless tobacco: Never Used  . Alcohol Use: Yes     Comment: occasional     Review of Systems  Constitutional: No fever/chills Cardiovascular: no chest pain. Respiratory: no cough. No SOB. Gastrointestinal: No abdominal pain.  No  nausea, no vomiting.  No diarrhea.  No constipation. Genitourinary: Positive for dysuria and polyuria.. No hematuria Musculoskeletal: Positive for left knee pain Skin: Negative for rash, abrasions, lacerations, ecchymosis. Neurological: Negative for headaches, focal weakness or numbness. 10-point ROS otherwise negative.  ____________________________________________   PHYSICAL EXAM:  VITAL SIGNS: ED Triage Vitals  Enc Vitals Group     BP --      Pulse --      Resp --      Temp --      Temp src --      SpO2 --      Weight 11/17/15 1509 360 lb (163.295 kg)     Height 11/17/15 1509 5\' 9"  (1.753 m)     Head Cir --      Peak Flow --      Pain Score 11/17/15 1510 8     Pain Loc --      Pain Edu? --      Excl. in GC? --      Constitutional: Alert and oriented. Well appearing and in no acute distress. Eyes: Conjunctivae are normal. PERRL. EOMI. Head: Atraumatic. Cardiovascular: Normal rate, regular rhythm. Normal S1 and S2.  Good peripheral circulation. Respiratory: Normal respiratory effort without tachypnea or retractions. Lungs CTAB. Good air entry to the bases with no decreased or absent breath sounds. Gastrointestinal: Bowel sounds 4 quadrants. Soft and nontender to palpation. No guarding or rigidity. No palpable masses. No distention. No CVA tenderness. Musculoskeletal: Full range of motion to all extremities. No gross deformities appreciated. Full range of motion to left knee. No visible deformity to inspection. There is, valgus, Lachman's is negative. No Palpable abnormality. Dorsalis pedis pulse intact distally. Sensation intact distally. Neurologic:  Normal speech and language. No gross focal neurologic deficits are appreciated.  Skin:  Skin is warm, dry and intact. No rash noted. The skin on the plantar foot is dry, flaking, cracked. There is darkening of the skin with dried, cracking skin in the interdigital space. Area is nontender to palpation. Psychiatric: Mood and  affect are normal. Speech and behavior are normal. Patient exhibits appropriate insight and judgement.   ____________________________________________   LABS (all labs ordered are listed, but only abnormal results are displayed)  Labs Reviewed  URINALYSIS COMPLETEWITH MICROSCOPIC ([facility] ONLY) - Abnormal; Notable for the following:    Color, Urine YELLOW (*)    APPearance CLEAR (*)    Squamous Epithelial / LPF 0-5 (*)    All other components within normal limits   ____________________________________________  EKG   ____________________________________________  RADIOLOGY Festus Barren Cuthriell, personally viewed and evaluated these images (plain radiographs) as part of my medical decision making, as well as reviewing the written report by the radiologist.  Dg Knee Complete 4 Views Left  11/17/2015  CLINICAL DATA:  Left knee pain which began 2 weeks ago. EXAM: LEFT KNEE - COMPLETE 4+ VIEW COMPARISON:  None. FINDINGS: No evidence of fracture, dislocation, or joint effusion. There is marginal spur formation associated with the patellofemoral compartment. Mild sharpening of the tibial spines noted. Soft tissues are unremarkable. IMPRESSION: 1. No acute findings. 2. Mild osteoarthritis. Electronically Signed   By: Signa Kell M.D.   On: 11/17/2015 15:53    ____________________________________________    PROCEDURES  Procedure(s) performed:       Medications - No data to display   ____________________________________________   INITIAL IMPRESSION / ASSESSMENT AND PLAN / ED COURSE  Pertinent labs & imaging results that were available during my care of the patient were reviewed by me and considered in my medical decision making (see chart for details).  Patient's diagnosis is consistent with cystitis, left knee pain, tinea pedis bilateral feet. Urinalysis returns with reassuring results with no indication of urinary tract infection. Patient will be given symptomatically  medications for treatment of this. Exam of the knee is reassuring and x-ray reveals mild osteoarthritis but no other acute osseous abnormality. Patient will be given anti-inflammatories for this condition. Patient also has a clinical diagnosis of tinea pedis of bilateral feet. Patient will be given oral antifungals as well as topical medication.. Patient will follow-up with primary care provider as needed. Patient is given ED precautions to return to the ED for any worsening or new symptoms.     ____________________________________________  FINAL CLINICAL IMPRESSION(S) / ED DIAGNOSES  Final diagnoses:  Cystitis  Knee pain, acute, left  Tinea pedis of both feet      NEW MEDICATIONS STARTED DURING THIS VISIT:  Discharge Medication List as of 11/17/2015  4:09 PM    START taking these medications   Details  fluconazole (DIFLUCAN) 150 MG tablet Take 1 tablet (150 mg total) by mouth once a week., Starting 11/17/2015, Until Discontinued, Print    terbinafine (LAMISIL) 1 % cream Apply 1 application topically 2 (two) times daily., Starting 11/17/2015, Until Discontinued, Print            This chart was dictated using voice recognition software/Dragon. Despite best efforts to proofread, errors can occur which can change the meaning. Any change was purely unintentional.    Racheal Patches, PA-C 11/17/15 1617  Arnaldo Natal, MD 11/17/15 2257

## 2015-11-17 NOTE — ED Notes (Signed)
Patient presents to the ED with left knee pain x 1 month and dysuria x 1 week.  Patient denies any injury.  Patient reports urinary frequency as well.  Patient states, "I think I have a bladder infection, and I don't know what's going on with my knee."  Patient is in no obvious distress at this time.

## 2015-11-17 NOTE — Discharge Instructions (Signed)
Athlete's Foot Athlete's foot (tinea pedis) is a fungal infection of the skin on the feet. It often occurs on the skin between the toes or underneath the toes. It can also occur on the soles of the feet. Athlete's foot is more likely to occur in hot, humid weather. Not washing your feet or changing your socks often enough can contribute to athlete's foot. The infection can spread from person to person (contagious). CAUSES Athlete's foot is caused by a fungus. This fungus thrives in warm, moist places. Most people get athlete's foot by sharing shower stalls, towels, and wet floors with an infected person. People with weakened immune systems, including those with diabetes, may be more likely to get athlete's foot. SYMPTOMS   Itchy areas between the toes or on the soles of the feet.  White, flaky, or scaly areas between the toes or on the soles of the feet.  Tiny, intensely itchy blisters between the toes or on the soles of the feet.  Tiny cuts on the skin. These cuts can develop a bacterial infection.  Thick or discolored toenails. DIAGNOSIS  Your caregiver can usually tell what the problem is by doing a physical exam. Your caregiver may also take a skin sample from the rash area. The skin sample may be examined under a microscope, or it may be tested to see if fungus will grow in the sample. A sample may also be taken from your toenail for testing. TREATMENT  Over-the-counter and prescription medicines can be used to kill the fungus. These medicines are available as powders or creams. Your caregiver can suggest medicines for you. Fungal infections respond slowly to treatment. You may need to continue using your medicine for several weeks. PREVENTION   Do not share towels.  Wear sandals in wet areas, such as shared locker rooms and shared showers.  Keep your feet dry. Wear shoes that allow air to circulate. Wear cotton or wool socks. HOME CARE INSTRUCTIONS   Take medicines as directed by  your caregiver. Do not use steroid creams on athlete's foot.  Keep your feet clean and cool. Wash your feet daily and dry them thoroughly, especially between your toes.  Change your socks every day. Wear cotton or wool socks. In hot climates, you may need to change your socks 2 to 3 times per day.  Wear sandals or canvas tennis shoes with good air circulation.  If you have blisters, soak your feet in Burow's solution or Epsom salts for 20 to 30 minutes, 2 times a day to dry out the blisters. Make sure you dry your feet thoroughly afterward. SEEK MEDICAL CARE IF:   You have a fever.  You have swelling, soreness, warmth, or redness in your foot.  You are not getting better after 7 days of treatment.  You are not completely cured after 30 days.  You have any problems caused by your medicines. MAKE SURE YOU:   Understand these instructions.  Will watch your condition.  Will get help right away if you are not doing well or get worse.   This information is not intended to replace advice given to you by your health care provider. Make sure you discuss any questions you have with your health care provider.   Document Released: 04/28/2000 Document Revised: 07/24/2011 Document Reviewed: 11/02/2014 Elsevier Interactive Patient Education 2016 [employer]  Knee Pain Knee pain is a very common symptom and can have many causes. Knee pain often goes away when you follow your health care provider's  instructions for relieving pain and discomfort at home. However, knee pain can develop into a condition that needs treatment. Some conditions may include:  Arthritis caused by wear and tear (osteoarthritis).  Arthritis caused by swelling and irritation (rheumatoid arthritis or gout).  A cyst or growth in your knee.  An infection in your knee joint.  An injury that will not heal.  Damage, swelling, or irritation of the tissues that support your knee (torn ligaments or tendinitis). If your  knee pain continues, additional tests may be ordered to diagnose your condition. Tests may include X-rays or other imaging studies of your knee. You may also need to have fluid removed from your knee. Treatment for ongoing knee pain depends on the cause, but treatment may include:  Medicines to relieve pain or swelling.  Steroid injections in your knee.  Physical therapy.  Surgery. HOME CARE INSTRUCTIONS  Take medicines only as directed by your health care provider.  Rest your knee and keep it raised (elevated) while you are resting.  Do not do things that cause or worsen pain.  Avoid high-impact activities or exercises, such as running, jumping rope, or doing jumping jacks.  Apply ice to the knee area:  Put ice in a plastic bag.  Place a towel between your skin and the bag.  Leave the ice on for 20 minutes, 2-3 times a day.  Ask your health care provider if you should wear an elastic knee support.  Keep a pillow under your knee when you sleep.  Lose weight if you are overweight. Extra weight can put pressure on your knee.  Do not use any tobacco products, including cigarettes, chewing tobacco, or electronic cigarettes. If you need help quitting, ask your health care provider. Smoking may slow the healing of any bone and joint problems that you may have. SEEK MEDICAL CARE IF:  Your knee pain continues, changes, or gets worse.  You have a fever along with knee pain.  Your knee buckles or locks up.  Your knee becomes more swollen. SEEK IMMEDIATE MEDICAL CARE IF:   Your knee joint feels hot to the touch.  You have chest pain or trouble breathing.   This information is not intended to replace advice given to you by your health care provider. Make sure you discuss any questions you have with your health care provider.   Document Released: 02/26/2007 Document Revised: 05/22/2014 Document Reviewed: 12/15/2013 Elsevier Interactive Patient Education 2016 Elsevier  Inc[employer] Interstitial Cystitis Interstitial cystitis is a condition that causes inflammation of the bladder. The bladder is a hollow organ in the lower part of your abdomen. It stores urine after the urine is made by your kidneys. With interstitial cystitis, you may have pain in the bladder area. You may also have a frequent and urgent need to urinate. The severity of interstitial cystitis can vary from person to person. You may have flare-ups of the condition, and then it may go away for a while. For many people who have this condition, it becomes a long-term problem. CAUSES The cause of this condition is not known. RISK FACTORS This condition is more likely to develop in women. SYMPTOMS Symptoms of interstitial cystitis vary, and they can change over time. Symptoms may include:  Discomfort or pain in the bladder area. This can range from mild to severe. The pain may change in intensity as the bladder fills with urine or as it empties.  Pelvic pain.  An urgent need to urinate.  Frequent urination.  Pain  during sexual intercourse.  Pinpoint bleeding on the bladder wall. For women, the symptoms often get worse during menstruation. DIAGNOSIS This condition is diagnosed by evaluating your symptoms and ruling out other causes. A physical exam will be done. Various tests may be done to rule out other conditions. Common tests include:  Urine tests.  Cystoscopy. In this test, a tool that is like a very thin telescope is used to look into your bladder.  Biopsy. This involves taking a sample of tissue from the bladder wall to be examined under a microscope. TREATMENT There is no cure for interstitial cystitis, but treatment methods are available to control your symptoms. Work closely with your health care provider to find the treatments that will be most effective for you. Treatment options may include:  Medicines to relieve pain and to help reduce the number of times that you feel the need to  urinate.  Bladder training. This involves learning ways to control when you urinate, such as:  Urinating at scheduled times.  Training yourself to delay urination.  Doing exercises (Kegel exercises) to strengthen the muscles that control urine flow.  Lifestyle changes, such as changing your diet or taking steps to control stress.  Use of a device that provides electrical stimulation in order to reduce pain.  A procedure that stretches your bladder by filling it with air or fluid.  Surgery. This is rare. It is only done for extreme cases if other treatments do not help. HOME CARE INSTRUCTIONS  Take medicines only as directed by your health care provider.  Use bladder training techniques as directed.  Keep a bladder diary to find out which foods, liquids, or activities make your symptoms worse.  Use your bladder diary to schedule bathroom trips. If you are away from home, plan to be near a bathroom at each of your scheduled times.  Make sure you urinate just before you leave the house and just before you go to bed.  Do Kegel exercises as directed by your health care provider.  Do not drink alcohol.  Do not use any tobacco products, including cigarettes, chewing tobacco, or electronic cigarettes. If you need help quitting, ask your health care provider.  Make dietary changes as directed by your health care provider. You may need to avoid spicy foods and foods that contain a high amount of potassium.  Limit your drinking of beverages that stimulate urination. These include soda, coffee, and tea.  Keep all follow-up visits as directed by your health care provider. This is important. SEEK MEDICAL CARE IF:  Your symptoms do not get better after treatment.  Your pain and discomfort are getting worse.  You have more frequent urges to urinate.  You have a fever. SEEK IMMEDIATE MEDICAL CARE IF:  You are not able to control your bladder at all.   This information is not  intended to replace advice given to you by your health care provider. Make sure you discuss any questions you have with your health care provider.   Document Released: 12/31/2003 Document Revised: 05/22/2014 Document Reviewed: 01/06/2014 Elsevier Interactive Patient Education [employer]

## 2016-01-10 ENCOUNTER — Emergency Department
Admission: EM | Admit: 2016-01-10 | Discharge: 2016-01-10 | Disposition: A | Discharge status: Home / Self Care | Payer: Self-pay | Attending: Student in an Organized Health Care Education/Training Program | Admitting: Student in an Organized Health Care Education/Training Program

## 2016-01-10 ENCOUNTER — Encounter: Payer: Self-pay | Admitting: Emergency Medicine

## 2016-01-10 DIAGNOSIS — J029 Acute pharyngitis, unspecified: Secondary | ICD-10-CM | POA: Insufficient documentation

## 2016-01-10 DIAGNOSIS — I10 Essential (primary) hypertension: Secondary | ICD-10-CM | POA: Insufficient documentation

## 2016-01-10 DIAGNOSIS — F17210 Nicotine dependence, cigarettes, uncomplicated: Secondary | ICD-10-CM | POA: Insufficient documentation

## 2016-01-10 LAB — POCT RAPID STREP A: STREPTOCOCCUS, GROUP A SCREEN (DIRECT): NEGATIVE

## 2016-01-10 MED ORDER — AZITHROMYCIN 250 MG PO TABS: ORAL_TABLET | ORAL | 0 refills | Status: DC

## 2016-01-10 NOTE — Discharge Instructions (Signed)
Please take medications as prescribed. She wants of fluids. Return to the ER for any worsening symptoms urgent changes in her health.

## 2016-01-10 NOTE — ED Notes (Signed)
Sore throat since yesterday  Increased pain with swallowing  Afebrile on arrival

## 2016-01-10 NOTE — ED Provider Notes (Signed)
[facility]-EMERGENCY DEPARTMENT Provider Note   CSN: [id number] Arrival date & time: 01/10/16  1753     History   Chief Complaint Chief Complaint  Patient presents with  . Sore Throat    HPI Janet Sheppard is a 30 y.o. female presents to emergency department for evaluation of sore throat. Sore throat has been present for 4 days. Patient states she has had minimal cough, moderate headache with severe sore throat pain. She has had mild low-grade fevers. No abdominal pain nausea vomiting or diarrhea. She is tolerating by mouth well. 2 days ago patient noticed white patches on her tonsils. No known contacts with strep. She is taken ibuprofen which is controlling her fevers. Patient denies any abdominal pain, rashes or neck pain. HPI  Past Medical History:  Diagnosis Date  . Anemia   . Bronchitis   . Hypertension     There are no active problems to display for this patient.   History reviewed. No pertinent surgical history.  OB History    No data available       Home Medications    Prior to Admission medications   Medication Sig Start Date End Date Taking? Authorizing Provider  azithromycin (ZITHROMAX Z-PAK) 250 MG tablet Take 2 tablets (500 mg) on  Day 1,  followed by 1 tablet (250 mg) once daily on Days 2 through 5. 01/10/16   Evon Slack, PA-C  ciprofloxacin (CIPRO) 500 MG tablet Take 1 tablet (500 mg total) by mouth 2 (two) times daily. 02/17/15   Jene Every, MD  famotidine (PEPCID) 20 MG tablet Take 1 tablet (20 mg total) by mouth 2 (two) times daily. 12/13/11 12/12/12  Elson Areas, PA-C  fluconazole (DIFLUCAN) 150 MG tablet Take 1 tablet (150 mg total) by mouth once a week. 11/17/15   Delorise Royals Cuthriell, PA-C  hydrochlorothiazide (HYDRODIURIL) 25 MG tablet Take 25 mg by mouth daily.    Historical Provider, MD  HYDROcodone-acetaminophen (NORCO/VICODIN) 5-325 MG tablet Take 1 tablet by mouth every 4 (four) hours as needed for moderate pain. 09/15/15   [name] Pester,  FNP  hydrocortisone cream 1 % Apply to affected area 2 times daily 12/08/13   Elwin Mocha, MD  ibuprofen (ADVIL,MOTRIN) 200 MG tablet Take 400 mg by mouth every 8 (eight) hours. For pain      Historical Provider, MD  Iron Combinations (IRON COMPLEX PO) Take 1 tablet by mouth daily.      Historical Provider, MD  meloxicam (MOBIC) 15 MG tablet Take 1 tablet (15 mg total) by mouth daily. 11/17/15   Delorise Royals Cuthriell, PA-C  Multiple Vitamin (MULTIVITAMIN PO) Take 1 tablet by mouth daily.      Historical Provider, MD  omeprazole (PRILOSEC) 20 MG capsule Take 1 capsule (20 mg total) by mouth daily. 08/23/11 08/22/12  Emeline General, MD  phenazopyridine (PYRIDIUM) 95 MG tablet Take 1 tablet (95 mg total) by mouth 3 (three) times daily as needed for pain. 11/17/15   Delorise Royals Cuthriell, PA-C  ranitidine (ZANTAC) 150 MG tablet Take 1 tablet (150 mg total) by mouth 2 (two) times daily. 12/31/12   Junious Silk, PA-C  sucralfate (CARAFATE) 1 GM/10ML suspension Take 10 mL (1 g total) by mouth 4 (four) times daily. 12/31/12   Junious Silk, PA-C  terbinafine (LAMISIL) 1 % cream Apply 1 application topically 2 (two) times daily. 11/17/15   Delorise Royals Cuthriell, PA-C  terbinafine (LAMISIL) 250 MG tablet Take 1 tablet (250 mg total) by mouth daily. 12/02/14  Joni Reining, PA-C    Family History No family history on file.  Social History Social History  Substance Use Topics  . Smoking status: Current Every Day Smoker    Packs/day: 0.25    Types: Cigarettes  . Smokeless tobacco: Never Used  . Alcohol use Yes     Comment: occasional     Allergies   Doxycycline; Amoxicillin-pot clavulanate; Cephalexin; Septra [bactrim]; and Tramadol   Review of Systems Review of Systems  Constitutional: Negative for activity change, chills, fatigue and fever.  HENT: Positive for sore throat. Negative for congestion, sinus pressure and trouble swallowing.   Eyes: Negative for visual disturbance.  Respiratory:  Positive for cough. Negative for chest tightness and shortness of breath.   Cardiovascular: Negative for chest pain and leg swelling.  Gastrointestinal: Negative for abdominal pain, diarrhea, nausea and vomiting.  Genitourinary: Negative for dysuria.  Musculoskeletal: Negative for arthralgias and gait problem.  Skin: Negative for rash.  Neurological: Negative for weakness, numbness and headaches.  Hematological: Negative for adenopathy.  Psychiatric/Behavioral: Negative for agitation, behavioral problems and confusion.     Physical Exam Updated Vital Signs BP (!) 158/92 (BP Location: Left Arm)   Pulse 75   Temp 98.7 F (37.1 C) (Oral)   Resp 16   Ht 5\' 9"  (1.753 m)   Wt (!) 163.3 kg   SpO2 100%   BMI 53.16 kg/m   Physical Exam  Constitutional: She is oriented to person, place, and time. She appears well-developed and well-nourished. No distress.  HENT:  Head: Normocephalic and atraumatic.  Right Ear: Hearing and external ear normal.  Left Ear: Hearing and external ear normal.  Nose: Nose normal.  Mouth/Throat: No oral lesions. No trismus in the jaw. No dental abscesses or uvula swelling. Oropharyngeal exudate and posterior oropharyngeal erythema present. No posterior oropharyngeal edema or tonsillar abscesses.  No uvula shifting  Eyes: EOM are normal. Pupils are equal, round, and reactive to light. Right eye exhibits no discharge. Left eye exhibits no discharge.  Neck: Normal range of motion. Neck supple.  Cardiovascular: Normal rate, regular rhythm and intact distal pulses.   Pulmonary/Chest: Effort normal and breath sounds normal. No respiratory distress. She exhibits no tenderness.  Abdominal: Soft. She exhibits no distension. There is no tenderness.  Musculoskeletal: Normal range of motion. She exhibits no edema.  Lymphadenopathy:    She has cervical adenopathy (tender anterior cervical lymphadenopathy).  Neurological: She is alert and oriented to person, place, and  time. She has normal reflexes.  Skin: Skin is warm and dry.  Psychiatric: She has a normal mood and affect. Her behavior is normal. Thought content normal.     ED Treatments / Results  Labs (all labs ordered are listed, but only abnormal results are displayed) Labs Reviewed  POCT RAPID STREP A    EKG  EKG Interpretation None       Radiology No results found.  Procedures Procedures (including critical care time)  Medications Ordered in ED Medications - No data to display   Initial Impression / Assessment and Plan / ED Course  I have reviewed the triage vital signs and the nursing notes.  Pertinent labs & imaging results that were available during my care of the patient were reviewed by me and considered in my medical decision making (see chart for details).  Clinical Course    30 year old female sore throat for 4 days. She has tender anterior cervical lymphadenopathy with exudates. No signs of peritonsillar abscess. She is afebrile, tolerating  by mouth well. No abdominal pain or rashes. Patient educated on signs and symptoms return to the ED for. She is started on azithromycin due to penicillin allergy.   Final Clinical Impressions(s) / ED Diagnoses   Final diagnoses:  Pharyngitis    New Prescriptions Discharge Medication List as of 01/10/2016  6:53 PM    START taking these medications   Details  azithromycin (ZITHROMAX Z-PAK) 250 MG tablet Take 2 tablets (500 mg) on  Day 1,  followed by 1 tablet (250 mg) once daily on Days 2 through 5., Print         Evon Slackhomas C Linnaea Ahn, PA-C 01/10/16 1907    Willy EddyPatrick Robinson, MD 01/10/16 2033

## 2016-01-10 NOTE — ED Triage Notes (Signed)
Sore throat with whiteness seen to tonsils.  Noticed symptoms yesterday.

## 2016-02-08 ENCOUNTER — Emergency Department
Admission: EM | Admit: 2016-02-08 | Discharge: 2016-02-09 | Disposition: A | Discharge status: Home / Self Care | Payer: Self-pay | Attending: Emergency Medicine | Admitting: Emergency Medicine

## 2016-02-08 ENCOUNTER — Encounter: Payer: Self-pay | Admitting: Emergency Medicine

## 2016-02-08 ENCOUNTER — Emergency Department: Payer: Self-pay

## 2016-02-08 DIAGNOSIS — I10 Essential (primary) hypertension: Secondary | ICD-10-CM | POA: Insufficient documentation

## 2016-02-08 DIAGNOSIS — R1011 Right upper quadrant pain: Secondary | ICD-10-CM

## 2016-02-08 DIAGNOSIS — Z79899 Other long term (current) drug therapy: Secondary | ICD-10-CM | POA: Insufficient documentation

## 2016-02-08 DIAGNOSIS — Z792 Long term (current) use of antibiotics: Secondary | ICD-10-CM | POA: Insufficient documentation

## 2016-02-08 DIAGNOSIS — R079 Chest pain, unspecified: Secondary | ICD-10-CM

## 2016-02-08 DIAGNOSIS — M940 Chondrocostal junction syndrome [Tietze]: Secondary | ICD-10-CM

## 2016-02-08 DIAGNOSIS — Z791 Long term (current) use of non-steroidal anti-inflammatories (NSAID): Secondary | ICD-10-CM | POA: Insufficient documentation

## 2016-02-08 DIAGNOSIS — F17210 Nicotine dependence, cigarettes, uncomplicated: Secondary | ICD-10-CM | POA: Insufficient documentation

## 2016-02-08 LAB — BASIC METABOLIC PANEL
Anion gap: 3 — ABNORMAL LOW (ref 5–15)
BUN: 12 mg/dL (ref 6–20)
CHLORIDE: 110 mmol/L (ref 101–111)
CO2: 26 mmol/L (ref 22–32)
Calcium: 9.1 mg/dL (ref 8.9–10.3)
Creatinine, Ser: 0.77 mg/dL (ref 0.44–1.00)
GFR calc Af Amer: >60 mL/min (ref >60)
GFR calc non Af Amer: >60 mL/min (ref >60)
Glucose, Bld: 103 mg/dL — ABNORMAL HIGH (ref 65–99)
POTASSIUM: 3.3 mmol/L — AB (ref 3.5–5.1)
SODIUM: 139 mmol/L (ref 135–145)

## 2016-02-08 LAB — CBC
HEMATOCRIT: 42.6 % (ref 35.0–47.0)
Hemoglobin: 13.8 g/dL (ref 12.0–16.0)
MCH: 27.3 pg (ref 26.0–34.0)
MCHC: 32.5 g/dL (ref 32.0–36.0)
MCV: 84.2 fL (ref 80.0–100.0)
Platelets: 202 10*3/uL (ref 150–440)
RBC: 5.07 MIL/uL (ref 3.80–5.20)
RDW: 14 % (ref 11.5–14.5)
WBC: 6.5 10*3/uL (ref 3.6–11.0)

## 2016-02-08 LAB — TROPONIN I: Troponin I: <0.03 ng/mL (ref <0.03)

## 2016-02-08 NOTE — ED Notes (Signed)
Pt presents to ED with c/o pain across chest x 1 week, nausea and dizziness since Sunday. Pt reports feeling tingling in left arm tonight. Pt denies vision problems, abdominal pain, vomiting or diarrhea. Pt alert and oriented x 4, no increased work in breathing noted.

## 2016-02-08 NOTE — ED Triage Notes (Signed)
Patient ambulatory to triage with steady gait, without difficulty or distress noted; pt reports upper chest pain, "pressure" radiating into back x week accomp by nausea & dizziness; denies hx of same

## 2016-02-09 ENCOUNTER — Emergency Department: Payer: Self-pay

## 2016-02-09 LAB — FIBRIN DERIVATIVES D-DIMER (ARMC ONLY): FIBRIN DERIVATIVES D-DIMER (ARMC): 267 (ref 0–499)

## 2016-02-09 NOTE — ED Notes (Signed)

## 2016-02-09 NOTE — ED Provider Notes (Signed)
[facility] Emergency Department Provider Note   First MD Initiated Contact with Patient 02/09/16 0030     (approximate)  I have reviewed the triage vital signs and the nursing notes.   HISTORY  Chief Complaint Chest Pain   HPI Janet Sheppard is a 30 y.o. female with history of anemia bronchitis and hypertension presents with one-week history of sharp upper chest pain radiating to her back accompanied by nausea and did patient states that the pain is worse and she takes a deep breath. Patient denies any lower external pain or swelling no history DVT or PE. Patient denies any cough or fever.   Past Medical History:  Diagnosis Date  . Anemia   . Bronchitis   . Hypertension     There are no active problems to display for this patient.   Past surgical history None  Prior to Admission medications   Medication Sig Start Date End Date Taking? Authorizing Provider  azithromycin (ZITHROMAX Z-PAK) 250 MG tablet Take 2 tablets (500 mg) on  Day 1,  followed by 1 tablet (250 mg) once daily on Days 2 through 5. 01/10/16   Evon Slackhomas C Gaines, PA-C  ciprofloxacin (CIPRO) 500 MG tablet Take 1 tablet (500 mg total) by mouth 2 (two) times daily. 02/17/15   Jene Everyobert Kinner, MD  famotidine (PEPCID) 20 MG tablet Take 1 tablet (20 mg total) by mouth 2 (two) times daily. 12/13/11 12/12/12  Elson AreasLeslie K Sofia, PA-C  fluconazole (DIFLUCAN) 150 MG tablet Take 1 tablet (150 mg total) by mouth once a week. 11/17/15   Delorise RoyalsJonathan D Cuthriell, PA-C  hydrochlorothiazide (HYDRODIURIL) 25 MG tablet Take 25 mg by mouth daily.    Historical Provider, MD  HYDROcodone-acetaminophen (NORCO/VICODIN) 5-325 MG tablet Take 1 tablet by mouth every 4 (four) hours as needed for moderate pain. 09/15/15   [name] Pesterari B Triplett, FNP  hydrocortisone cream 1 % Apply to affected area 2 times daily 12/08/13   Elwin MochaBlair Walden, MD  ibuprofen (ADVIL,MOTRIN) 200 MG tablet Take 400 mg by mouth every 8 (eight) hours. For pain  Historical Provider, MD  Iron Combinations (IRON COMPLEX PO) Take 1 tablet by mouth daily.      Historical Provider, MD  meloxicam (MOBIC) 15 MG tablet Take 1 tablet (15 mg total) by mouth daily. 11/17/15   Delorise RoyalsJonathan D Cuthriell, PA-C  Multiple Vitamin (MULTIVITAMIN PO) Take 1 tablet by mouth daily.      Historical Provider, MD  omeprazole (PRILOSEC) 20 MG capsule Take 1 capsule (20 mg total) by mouth daily. 08/23/11 08/22/12  Emeline GeneralKathleen Hosmer, MD  phenazopyridine (PYRIDIUM) 95 MG tablet Take 1 tablet (95 mg total) by mouth 3 (three) times daily as needed for pain. 11/17/15   Delorise RoyalsJonathan D Cuthriell, PA-C  ranitidine (ZANTAC) 150 MG tablet Take 1 tablet (150 mg total) by mouth 2 (two) times daily. 12/31/12   Junious SilkHannah Merrell, PA-C  sucralfate (CARAFATE) 1 GM/10ML suspension Take 10 mL (1 g total) by mouth 4 (four) times daily. 12/31/12   Junious SilkHannah Merrell, PA-C  terbinafine (LAMISIL) 1 % cream Apply 1 application topically 2 (two) times daily. 11/17/15   Delorise RoyalsJonathan D Cuthriell, PA-C  terbinafine (LAMISIL) 250 MG tablet Take 1 tablet (250 mg total) by mouth daily. 12/02/14   Joni Reiningonald K Smith, PA-C    Allergies Doxycycline; Amoxicillin-pot clavulanate; Cephalexin; Septra [bactrim]; and Tramadol  No family history on file.  Social History Social History  Substance Use Topics  . Smoking status: Current Every Day Smoker  Packs/day: 0.25    Types: Cigarettes  . Smokeless tobacco: Never Used  . Alcohol use Yes     Comment: occasional    Review of Systems Constitutional: No fever/chills Eyes: No visual changes. ENT: No sore throat. Cardiovascular: Positive for chest pain. Respiratory: Denies shortness of breath. Gastrointestinal: No abdominal pain.  No nausea, no vomiting.  No diarrhea.  No constipation. Genitourinary: Negative for dysuria. Musculoskeletal: Negative for back pain. Skin: Negative for rash. Neurological: Negative for headaches, focal weakness or numbness.  10-point ROS otherwise  negative.  ____________________________________________   PHYSICAL EXAM:  VITAL SIGNS: ED Triage Vitals  Enc Vitals Group     BP 02/08/16 2218 (!) 180/100     Pulse Rate 02/08/16 2218 94     Resp 02/08/16 2218 20     Temp 02/08/16 2218 98 F (36.7 C)     Temp Source 02/08/16 2218 Oral     SpO2 02/08/16 2218 98 %     Weight 02/08/16 2217 (!) 360 lb (163.3 kg)     Height 02/08/16 2217 5\' 9"  (1.753 m)     Head Circumference --      Peak Flow --      Pain Score 02/08/16 2217 9     Pain Loc --      Pain Edu? --      Excl. in GC? --     Constitutional: Alert and oriented. Well appearing and in no acute distress. Eyes: Conjunctivae are normal. PERRL. EOMI. Head: Atraumatic. Nose: No congestion/rhinnorhea. Mouth/Throat: Mucous membranes are moist.  Oropharynx non-erythematous. Neck: No stridor.  No meningeal signs.  Cardiovascular: Normal rate, regular rhythm. Good peripheral circulation. Grossly normal heart sounds.Pain with palpation bilateral sternal border Respiratory: Normal respiratory effort.  No retractions. Lungs CTAB. Gastrointestinal: Soft and nontender. No distention.  Musculoskeletal: No lower extremity tenderness nor edema. No gross deformities of extremities. Neurologic:  Normal speech and language. No gross focal neurologic deficits are appreciated.  Skin:  Skin is warm, dry and intact. No rash noted. Psychiatric: Mood and affect are normal. Speech and behavior are normal.  ____________________________________________   LABS (all labs ordered are listed, but only abnormal results are displayed)  Labs Reviewed  BASIC METABOLIC PANEL - Abnormal; Notable for the following:       Result Value   Potassium 3.3 (*)    Glucose, Bld 103 (*)    Anion gap 3 (*)    All other components within normal limits  CBC  TROPONIN I  FIBRIN DERIVATIVES D-DIMER ([facility] ONLY)   ____________________________________________  EKG  ED ECG REPORT I, Taylor N Zavian Slowey, the  attending physician, personally viewed and interpreted this ECG.   Date: 02/09/2016  EKG Time: 10:26 PM  Rate: 87  Rhythm: Normal sinus rhythm  Axis: Normal  Intervals: Normal  ST&T Change: None  ____________________________________________  RADIOLOGY I, [place] Jerell Demery, personally viewed and evaluated these images (plain radiographs) as part of my medical decision making, as well as reviewing the written report by the radiologist.  Dg Chest 2 View  Result Date: 02/09/2016 CLINICAL DATA:  Acute onset of upper chest pain and pressure radiating to the back. Nausea and dizziness. Initial encounter. EXAM: CHEST  2 VIEW COMPARISON:  Chest radiograph performed 12/09/2010 FINDINGS: The lungs are well-aerated. Mild vascular congestion is noted. There is no evidence of focal opacification, pleural effusion or pneumothorax. The heart is normal in size; the mediastinal contour is within normal limits. No acute osseous abnormalities are seen. IMPRESSION: Mild  vascular congestion noted.  Lungs remain grossly clear. Electronically Signed   By: Roanna Raider M.D.   On: 02/09/2016 00:48   US Abdomen Limited Ruq  Result Date: 02/09/2016 CLINICAL DATA:  Acute onset of right upper quadrant abdominal pain. Initial encounter. EXAM: US ABDOMEN LIMITED - RIGHT UPPER QUADRANT COMPARISON:  Abdominal ultrasound performed 12/31/2012 FINDINGS: Gallbladder: No gallstones or wall thickening visualized. No sonographic Murphy sign noted by sonographer. Common bile duct: Diameter: 0.3 cm, within normal limits in caliber. Liver: No focal lesion identified. Diffusely increased parenchymal echogenicity likely reflects fatty infiltration. IMPRESSION: 1. No acute abnormality seen at the right upper quadrant. 2. Diffuse fatty infiltration within the liver. Electronically Signed   By: Roanna Raider M.D.   On: 02/09/2016 00:44    ____________________________________________   Procedures     INITIAL IMPRESSION / ASSESSMENT  AND PLAN / ED COURSE  Pertinent labs & imaging results that were available during my care of the patient were reviewed by me and considered in my medical decision making (see chart for details).  History physical exam consistent with possible costochondritis. EKG revealed no evidence of ischemia or infarction troponin negative and d-dimer negative   Clinical Course    ____________________________________________  FINAL CLINICAL IMPRESSION(S) / ED DIAGNOSES  Final diagnoses:  RUQ pain  Nonspecific chest pain  Costochondritis, acute     MEDICATIONS GIVEN DURING THIS VISIT:  Medications - No data to display   NEW OUTPATIENT MEDICATIONS STARTED DURING THIS VISIT:  Discharge Medication List as of 02/09/2016  2:12 AM      Discharge Medication List as of 02/09/2016  2:12 AM      Discharge Medication List as of 02/09/2016  2:12 AM       Note:  This document was prepared using Dragon voice recognition software and may include unintentional dictation errors.    Darci Current, MD 02/09/16 [phone]

## 2016-05-17 ENCOUNTER — Encounter: Payer: Self-pay | Admitting: *Deleted

## 2016-05-17 ENCOUNTER — Emergency Department
Admission: EM | Admit: 2016-05-17 | Discharge: 2016-05-17 | Disposition: A | Discharge status: Home / Self Care | Payer: Self-pay | Attending: Emergency Medicine | Admitting: Emergency Medicine

## 2016-05-17 ENCOUNTER — Emergency Department: Payer: Self-pay

## 2016-05-17 DIAGNOSIS — Z791 Long term (current) use of non-steroidal anti-inflammatories (NSAID): Secondary | ICD-10-CM | POA: Insufficient documentation

## 2016-05-17 DIAGNOSIS — I10 Essential (primary) hypertension: Secondary | ICD-10-CM | POA: Insufficient documentation

## 2016-05-17 DIAGNOSIS — J069 Acute upper respiratory infection, unspecified: Secondary | ICD-10-CM | POA: Insufficient documentation

## 2016-05-17 DIAGNOSIS — F17210 Nicotine dependence, cigarettes, uncomplicated: Secondary | ICD-10-CM | POA: Insufficient documentation

## 2016-05-17 DIAGNOSIS — B9789 Other viral agents as the cause of diseases classified elsewhere: Secondary | ICD-10-CM

## 2016-05-17 DIAGNOSIS — Z79899 Other long term (current) drug therapy: Secondary | ICD-10-CM | POA: Insufficient documentation

## 2016-05-17 MED ORDER — BENZONATATE 100 MG PO CAPS: 100.0000 mg | ORAL_CAPSULE | Freq: Three times a day (TID) | ORAL | 0 refills | Status: DC | PRN

## 2016-05-17 MED ORDER — BENZONATATE 100 MG PO CAPS
200.0000 mg | ORAL_CAPSULE | Freq: Once | ORAL | Status: AC
  Administered 2016-05-17: 200 mg via ORAL
  Filled 2016-05-17: qty 2

## 2016-05-17 MED ORDER — IPRATROPIUM-ALBUTEROL 0.5-2.5 (3) MG/3ML IN SOLN
3.0000 mL | Freq: Once | RESPIRATORY_TRACT | Status: AC
  Administered 2016-05-17: 3 mL via RESPIRATORY_TRACT
  Filled 2016-05-17: qty 3

## 2016-05-17 MED ORDER — ACETAMINOPHEN-CODEINE #3 300-30 MG PO TABS: 1.0000 | ORAL_TABLET | Freq: Two times a day (BID) | ORAL | 0 refills | Status: DC | PRN

## 2016-05-17 MED ORDER — ACETAMINOPHEN-CODEINE #3 300-30 MG PO TABS
1.0000 | ORAL_TABLET | Freq: Once | ORAL | Status: AC
  Administered 2016-05-17: 1 via ORAL
  Filled 2016-05-17: qty 1

## 2016-05-17 NOTE — ED Provider Notes (Signed)
[facility] Emergency Department Provider Note ____________________________________________  Time seen: 1824  I have reviewed the triage vital signs and the nursing notes.  HISTORY  Chief Complaint  Sore Throat  HPI Janet Sheppard is a 31 y.o. female presents to the ED for evaluation of a 2 to three-week complaint of a intermittently productive cough. She's also had the onset of some sore throat discomfort last 2-3 days. She describes her cough is worse at night, and now has a headache related to the cough. She denies any outright fevers, chills, sweats. She also denies any chest pain, shortness of breath, or wheeze. She has taken Robitussin over-the-counter with limited benefit. She did not receive the flu vaccine this season.  Past Medical History:  Diagnosis Date  . Anemia   . Bronchitis   . Hypertension     There are no active problems to display for this patient.   History reviewed. No pertinent surgical history.  Prior to Admission medications   Medication Sig Start Date End Date Taking? Authorizing Provider  acetaminophen-codeine (TYLENOL #3) 300-30 MG tablet Take 1 tablet by mouth 2 (two) times daily as needed for moderate pain. 05/17/16   Cydney Alvarenga V Bacon Starlina Lapre, PA-C  azithromycin (ZITHROMAX Z-PAK) 250 MG tablet Take 2 tablets (500 mg) on  Day 1,  followed by 1 tablet (250 mg) once daily on Days 2 through 5. 01/10/16   Evon Slackhomas C Gaines, PA-C  benzonatate (TESSALON PERLES) 100 MG capsule Take 1 capsule (100 mg total) by mouth 3 (three) times daily as needed for cough (Take 1-2 per dose). 05/17/16   Rashelle Ireland V Bacon Charvis Lightner, PA-C  ciprofloxacin (CIPRO) 500 MG tablet Take 1 tablet (500 mg total) by mouth 2 (two) times daily. 02/17/15   Jene Everyobert Kinner, MD  famotidine (PEPCID) 20 MG tablet Take 1 tablet (20 mg total) by mouth 2 (two) times daily. 12/13/11 12/12/12  Elson AreasLeslie K Sofia, PA-C  fluconazole (DIFLUCAN) 150 MG tablet Take 1 tablet (150 mg total) by mouth  once a week. 11/17/15   Delorise RoyalsJonathan D Cuthriell, PA-C  hydrochlorothiazide (HYDRODIURIL) 25 MG tablet Take 25 mg by mouth daily.    Historical Provider, MD  HYDROcodone-acetaminophen (NORCO/VICODIN) 5-325 MG tablet Take 1 tablet by mouth every 4 (four) hours as needed for moderate pain. 09/15/15   [name] Pesterari B Triplett, FNP  hydrocortisone cream 1 % Apply to affected area 2 times daily 12/08/13   Elwin MochaBlair Walden, MD  ibuprofen (ADVIL,MOTRIN) 200 MG tablet Take 400 mg by mouth every 8 (eight) hours. For pain      Historical Provider, MD  Iron Combinations (IRON COMPLEX PO) Take 1 tablet by mouth daily.      Historical Provider, MD  meloxicam (MOBIC) 15 MG tablet Take 1 tablet (15 mg total) by mouth daily. 11/17/15   Delorise RoyalsJonathan D Cuthriell, PA-C  Multiple Vitamin (MULTIVITAMIN PO) Take 1 tablet by mouth daily.      Historical Provider, MD  omeprazole (PRILOSEC) 20 MG capsule Take 1 capsule (20 mg total) by mouth daily. 08/23/11 08/22/12  Emeline GeneralKathleen Hosmer, MD  phenazopyridine (PYRIDIUM) 95 MG tablet Take 1 tablet (95 mg total) by mouth 3 (three) times daily as needed for pain. 11/17/15   Delorise RoyalsJonathan D Cuthriell, PA-C  ranitidine (ZANTAC) 150 MG tablet Take 1 tablet (150 mg total) by mouth 2 (two) times daily. 12/31/12   Junious SilkHannah Merrell, PA-C  sucralfate (CARAFATE) 1 GM/10ML suspension Take 10 mL (1 g total) by mouth 4 (four) times daily. 12/31/12   Dahlia ClientHannah  Merrell, PA-C  terbinafine (LAMISIL) 1 % cream Apply 1 application topically 2 (two) times daily. 11/17/15   Delorise Royals Cuthriell, PA-C  terbinafine (LAMISIL) 250 MG tablet Take 1 tablet (250 mg total) by mouth daily. 12/02/14   Joni Reining, PA-C    Allergies Doxycycline; Amoxicillin-pot clavulanate; Cephalexin; Septra [bactrim]; and Tramadol  History reviewed. No pertinent family history.  Social History Social History  Substance Use Topics  . Smoking status: Current Every Day Smoker    Packs/day: 0.25    Types: Cigarettes  . Smokeless tobacco: Never Used  . Alcohol use  Yes     Comment: occasional    Review of Systems  Constitutional: Negative for fever. Eyes: Negative for visual changes. ENT: Negative for sore throat. Cardiovascular: Negative for chest pain. Respiratory: Negative for shortness of breath. Reports cough as above Gastrointestinal: Negative for abdominal pain, vomiting and diarrhea. Musculoskeletal: Negative for back pain. Skin: Negative for rash. Neurological: Negative for headaches, focal weakness or numbness. ____________________________________________  PHYSICAL EXAM:  VITAL SIGNS: ED Triage Vitals [05/17/16 1757]  Enc Vitals Group     BP (!) 160/81     Pulse Rate 88     Resp 18     Temp 98.3 F (36.8 C)     Temp Source Oral     SpO2 100 %     Weight (!) 360 lb (163.3 kg)     Height 5\' 9"  (1.753 m)     Head Circumference      Peak Flow      Pain Score 5     Pain Loc      Pain Edu?      Excl. in GC?    Constitutional: Alert and oriented. Well appearing and in no distress. Head: Normocephalic and atraumatic. Eyes: Conjunctivae are normal. PERRL. Normal extraocular movements Ears: Canals clear. TMs intact bilaterally. Nose: No congestion/rhinorrhea/epistaxis. Mouth/Throat: Mucous membranes are moist.  Neck: Supple. No thyromegaly. Hematological/Lymphatic/Immunological: No cervical lymphadenopathy. Cardiovascular: Normal rate, regular rhythm. Normal distal pulses. Respiratory: Normal respiratory effort. No wheezes/rales/rhonchi. Gastrointestinal: Soft and nontender. No distention. Musculoskeletal: Nontender with normal range of motion in all extremities.  Neurologic:  Normal gait without ataxia. Normal speech and language. No gross focal neurologic deficits are appreciated. Skin:  Skin is warm, dry and intact. No rash noted. ____________________________________________   RADIOLOGY  CXR IMPRESSION: Negative chest. ____________________________________________  PROCEDURES  DuoNeb x 1 Tessalon Perle 200 mg  PO Tylenol #3 i PO ____________________________________________  INITIAL IMPRESSION / ASSESSMENT AND PLAN / ED COURSE  Patient with a presentation consistent with a likely viral URI. Radiologic exams not show any acute cardiopulmonary process. Patient is discharged with prescriptions for Tessalon Perles, Tylenol with Codeine, and instructions to dose OTC Delsym for cough relief.   Clinical Course    ____________________________________________  FINAL CLINICAL IMPRESSION(S) / ED DIAGNOSES  Final diagnoses:  Viral URI with cough      Lissa Hoard, PA-C 05/17/16 2350    Rockne Menghini, MD 05/21/16 1244

## 2016-05-17 NOTE — Discharge Instructions (Signed)
Take the prescription meds as directed. Start an OTC allergy medicine and Delsym for cough relief.

## 2016-05-17 NOTE — ED Notes (Signed)
See triage note  States she developed non prod cough about 2 weeks ago  Unsure of fever  Then developed sore throat on sunday

## 2016-05-17 NOTE — ED Triage Notes (Signed)
States sore throat and dry cough for 2 weeks

## 2016-06-10 ENCOUNTER — Encounter: Payer: Self-pay | Admitting: Emergency Medicine

## 2016-06-10 ENCOUNTER — Emergency Department
Admission: EM | Admit: 2016-06-10 | Discharge: 2016-06-10 | Disposition: A | Discharge status: Home / Self Care | Payer: Self-pay | Attending: Emergency Medicine | Admitting: Emergency Medicine

## 2016-06-10 DIAGNOSIS — J0100 Acute maxillary sinusitis, unspecified: Secondary | ICD-10-CM | POA: Insufficient documentation

## 2016-06-10 DIAGNOSIS — I10 Essential (primary) hypertension: Secondary | ICD-10-CM | POA: Insufficient documentation

## 2016-06-10 DIAGNOSIS — F17210 Nicotine dependence, cigarettes, uncomplicated: Secondary | ICD-10-CM | POA: Insufficient documentation

## 2016-06-10 LAB — GLUCOSE, CAPILLARY: GLUCOSE-CAPILLARY: 79 mg/dL (ref 65–99)

## 2016-06-10 LAB — POCT RAPID STREP A: STREPTOCOCCUS, GROUP A SCREEN (DIRECT): NEGATIVE

## 2016-06-10 MED ORDER — IBUPROFEN 400 MG PO TABS
400.0000 mg | ORAL_TABLET | Freq: Once | ORAL | Status: AC
  Administered 2016-06-10: 400 mg via ORAL
  Filled 2016-06-10: qty 1

## 2016-06-10 MED ORDER — FIRST-DUKES MOUTHWASH MT SUSP: 10.0000 mL | Freq: Four times a day (QID) | OROMUCOSAL | 0 refills | Status: DC

## 2016-06-10 MED ORDER — FEXOFENADINE-PSEUDOEPHED ER 60-120 MG PO TB12: 1.0000 | ORAL_TABLET | Freq: Two times a day (BID) | ORAL | 0 refills | Status: DC

## 2016-06-10 MED ORDER — CLARITHROMYCIN 500 MG PO TABS: 500.0000 mg | ORAL_TABLET | Freq: Two times a day (BID) | ORAL | 0 refills | Status: AC

## 2016-06-10 NOTE — ED Notes (Signed)
Pt verbalized understanding of discharge instructions. NAD at this time. 

## 2016-06-10 NOTE — ED Notes (Signed)
Pt states she is having frequent headaches with dizziness and vision changes.

## 2016-06-10 NOTE — ED Provider Notes (Signed)
[facility] Emergency Department Provider Note   ____________________________________________   First MD Initiated Contact with Patient 06/10/16 1307     (approximate)  I have reviewed the triage vital signs and the nursing notes.   HISTORY 0 Chief Complaint Sore Throat and Facial Pain    HPI Janet Sheppard is a 31 y.o. female patient complaining of continued sore throat for a patches on her tonsils for 3 weeks. Patient also complaining of sinus pressure causing headache. Patient state is a nonproductive cough associated these complaints. Patient states she was seen in this facilitythe first week of this month and was diagnosed with a viral upper rest or infection. Patient state her complaints have not resolved with medication prescribed. Patient says sore throat worsens at night secondary to postnasal drainage. Patient states hard to swallow but she is able to tolerate food and fluids. Patient rates the pain as a 10 over 10.   Past Medical History:  Diagnosis Date  . Anemia   . Bronchitis   . Hypertension     There are no active problems to display for this patient.   History reviewed. No pertinent surgical history.  Prior to Admission medications   Medication Sig Start Date End Date Taking? Authorizing Provider  acetaminophen-codeine (TYLENOL #3) 300-30 MG tablet Take 1 tablet by mouth 2 (two) times daily as needed for moderate pain. 05/17/16   Jenise V Bacon Menshew, PA-C  azithromycin (ZITHROMAX Z-PAK) 250 MG tablet Take 2 tablets (500 mg) on  Day 1,  followed by 1 tablet (250 mg) once daily on Days 2 through 5. 01/10/16   Evon Slackhomas C Gaines, PA-C  benzonatate (TESSALON PERLES) 100 MG capsule Take 1 capsule (100 mg total) by mouth 3 (three) times daily as needed for cough (Take 1-2 per dose). 05/17/16   Jenise V Bacon Menshew, PA-C  ciprofloxacin (CIPRO) 500 MG tablet Take 1 tablet (500 mg total) by mouth 2 (two) times daily. 02/17/15   Jene Everyobert Kinner,  MD  clarithromycin (BIAXIN) 500 MG tablet Take 1 tablet (500 mg total) by mouth 2 (two) times daily. 06/10/16 06/24/16  Joni Reiningonald K Smith, PA-C  Diphenhyd-Hydrocort-Nystatin (FIRST-DUKES MOUTHWASH) SUSP Use as directed 10 mLs in the mouth or throat 4 (four) times daily. 06/10/16   Joni Reiningonald K Smith, PA-C  famotidine (PEPCID) 20 MG tablet Take 1 tablet (20 mg total) by mouth 2 (two) times daily. 12/13/11 12/12/12  Elson AreasLeslie K Sofia, PA-C  fexofenadine-pseudoephedrine (ALLEGRA-D) 60-120 MG 12 hr tablet Take 1 tablet by mouth 2 (two) times daily. 06/10/16   Joni Reiningonald K Smith, PA-C  fluconazole (DIFLUCAN) 150 MG tablet Take 1 tablet (150 mg total) by mouth once a week. 11/17/15   Delorise RoyalsJonathan D Cuthriell, PA-C  hydrochlorothiazide (HYDRODIURIL) 25 MG tablet Take 25 mg by mouth daily.    Historical Provider, MD  HYDROcodone-acetaminophen (NORCO/VICODIN) 5-325 MG tablet Take 1 tablet by mouth every 4 (four) hours as needed for moderate pain. 09/15/15   [name] Pesterari B Triplett, FNP  hydrocortisone cream 1 % Apply to affected area 2 times daily 12/08/13   Elwin MochaBlair Walden, MD  ibuprofen (ADVIL,MOTRIN) 200 MG tablet Take 400 mg by mouth every 8 (eight) hours. For pain      Historical Provider, MD  Iron Combinations (IRON COMPLEX PO) Take 1 tablet by mouth daily.      Historical Provider, MD  meloxicam (MOBIC) 15 MG tablet Take 1 tablet (15 mg total) by mouth daily. 11/17/15   Delorise RoyalsJonathan D Cuthriell, PA-C  Multiple Vitamin (  MULTIVITAMIN PO) Take 1 tablet by mouth daily.      Historical Provider, MD  omeprazole (PRILOSEC) 20 MG capsule Take 1 capsule (20 mg total) by mouth daily. 08/23/11 08/22/12  Emeline General, MD  phenazopyridine (PYRIDIUM) 95 MG tablet Take 1 tablet (95 mg total) by mouth 3 (three) times daily as needed for pain. 11/17/15   Delorise Royals Cuthriell, PA-C  ranitidine (ZANTAC) 150 MG tablet Take 1 tablet (150 mg total) by mouth 2 (two) times daily. 12/31/12   Junious Silk, PA-C  sucralfate (CARAFATE) 1 GM/10ML suspension Take 10 mL (1 g  total) by mouth 4 (four) times daily. 12/31/12   Junious Silk, PA-C  terbinafine (LAMISIL) 1 % cream Apply 1 application topically 2 (two) times daily. 11/17/15   Delorise Royals Cuthriell, PA-C  terbinafine (LAMISIL) 250 MG tablet Take 1 tablet (250 mg total) by mouth daily. 12/02/14   Joni Reining, PA-C    Allergies Doxycycline; Amoxicillin-pot clavulanate; Cephalexin; Septra [bactrim]; and Tramadol  History reviewed. No pertinent family history.  Social History Social History  Substance Use Topics  . Smoking status: Current Every Day Smoker    Packs/day: 0.25    Types: Cigarettes  . Smokeless tobacco: Never Used  . Alcohol use Yes     Comment: occasional    Review of Systems Constitutional: No fever/chills Eyes: No visual changes. ENT: Sinus congestion and sore throat Cardiovascular: Denies chest pain. Respiratory: Denies shortness of breath. Nonproductive cough Gastrointestinal: No abdominal pain.  No nausea, no vomiting.  No diarrhea.  No constipation. Genitourinary: Negative for dysuria. Musculoskeletal: Negative for back pain. Skin: Negative for rash. Neurological: Negative for headaches, focal weakness or numbness. Endocrine:Hypertension Allergic/Immunilogical: See medication list.  ____________________________________________   PHYSICAL EXAM:  VITAL SIGNS: ED Triage Vitals [06/10/16 1257]  Enc Vitals Group     BP      Pulse      Resp      Temp      Temp src      SpO2      Weight      Height      Head Circumference      Peak Flow      Pain Score 10     Pain Loc      Pain Edu?      Excl. in GC?     Constitutional: Alert and oriented. Well appearing and in no acute distress.Morbid obesity Eyes: Conjunctivae are normal. PERRL. EOMI. Head: Atraumatic. Nose: Bilateral maxillary guarding. Edematous nasal turbinates. Mouth/Throat: Mucous membranes are moist.  Oropharynx non-erythematous. No visible exudate. Copious postnasal drainage. Neck: No stridor.  No  cervical spine tenderness to palpation. Hematological/Lymphatic/Immunilogical: No cervical lymphadenopathy. Cardiovascular: Normal rate, regular rhythm. Grossly normal heart sounds.  Good peripheral circulation.  Respiratory: Normal respiratory effort.  No retractions. Lungs CTAB. Gastrointestinal: Soft and nontender. No distention. No abdominal bruits. No CVA tenderness. Musculoskeletal: No lower extremity tenderness nor edema.  No joint effusions. Neurologic:  Normal speech and language. No gross focal neurologic deficits are appreciated. No gait instability. Skin:  Skin is warm, dry and intact. No rash noted. Psychiatric: Mood and affect are normal. Speech and behavior are normal.  ____________________________________________   LABS (all labs ordered are listed, but only abnormal results are displayed)  Labs Reviewed  GLUCOSE, CAPILLARY  CBG MONITORING, ED  POCT RAPID STREP A   ____________________________________________  EKG   ____________________________________________  RADIOLOGY   ____________________________________________   PROCEDURES  Procedure(s) performed: None  Procedures  Critical  Care performed: No  ____________________________________________   INITIAL IMPRESSION / ASSESSMENT AND PLAN / ED COURSE  Pertinent labs & imaging results that were available during my care of the patient were reviewed by me and considered in my medical decision making (see chart for details).  Sinusitis. Patient given discharge care instructions. Patient given prescription for Allegra-D and Biaxin. Patient advised to follow her PCP if no improvement in one week.      ____________________________________________   FINAL CLINICAL IMPRESSION(S) / ED DIAGNOSES  Final diagnoses:  Subacute maxillary sinusitis      NEW MEDICATIONS STARTED DURING THIS VISIT:  New Prescriptions   CLARITHROMYCIN (BIAXIN) 500 MG TABLET    Take 1 tablet (500 mg total) by mouth 2 (two)  times daily.   DIPHENHYD-HYDROCORT-NYSTATIN (FIRST-DUKES MOUTHWASH) SUSP    Use as directed 10 mLs in the mouth or throat 4 (four) times daily.   FEXOFENADINE-PSEUDOEPHEDRINE (ALLEGRA-D) 60-120 MG 12 HR TABLET    Take 1 tablet by mouth 2 (two) times daily.     Note:  This document was prepared using Dragon voice recognition software and may include unintentional dictation errors.    Joni Reining, PA-C 06/10/16 1351    Minna Antis, MD 06/10/16 1540

## 2016-06-10 NOTE — ED Triage Notes (Signed)
Sore throat and white patches for several days, was seen in the past 3 weeks for similar sxs. Pt reports sinus pain causing headaches as well. miminal cough.

## 2016-08-15 ENCOUNTER — Encounter: Payer: Self-pay | Admitting: Emergency Medicine

## 2016-08-15 ENCOUNTER — Emergency Department: Payer: Self-pay

## 2016-08-15 ENCOUNTER — Emergency Department
Admission: EM | Admit: 2016-08-15 | Discharge: 2016-08-15 | Disposition: A | Discharge status: Home / Self Care | Payer: Self-pay | Attending: Emergency Medicine | Admitting: Emergency Medicine

## 2016-08-15 DIAGNOSIS — M6289 Other specified disorders of muscle: Secondary | ICD-10-CM

## 2016-08-15 DIAGNOSIS — I10 Essential (primary) hypertension: Secondary | ICD-10-CM | POA: Insufficient documentation

## 2016-08-15 DIAGNOSIS — M79652 Pain in left thigh: Secondary | ICD-10-CM | POA: Insufficient documentation

## 2016-08-15 DIAGNOSIS — Z87891 Personal history of nicotine dependence: Secondary | ICD-10-CM | POA: Insufficient documentation

## 2016-08-15 DIAGNOSIS — Z79899 Other long term (current) drug therapy: Secondary | ICD-10-CM | POA: Insufficient documentation

## 2016-08-15 MED ORDER — NAPROXEN 500 MG PO TABS: 500.0000 mg | ORAL_TABLET | Freq: Two times a day (BID) | ORAL | 0 refills | Status: DC

## 2016-08-15 MED ORDER — BACLOFEN 10 MG PO TABS: 10.0000 mg | ORAL_TABLET | Freq: Three times a day (TID) | ORAL | 0 refills | Status: DC

## 2016-08-15 MED ORDER — NAPROXEN 500 MG PO TABS
500.0000 mg | ORAL_TABLET | Freq: Once | ORAL | Status: AC
  Administered 2016-08-15: 500 mg via ORAL
  Filled 2016-08-15: qty 1

## 2016-08-15 NOTE — Discharge Instructions (Signed)
Follow-up with primary care provider for symptoms that are not improving over the week. Take medication as prescribed. Return to the emergency department for symptoms change or worsen if you're unable schedule an appointment.

## 2016-08-15 NOTE — ED Provider Notes (Signed)
[facility] Emergency Department Provider Note ____________________________________________  Time seen: Approximately 7:42 PM  I have reviewed the triage vital signs and the nursing notes.   HISTORY  Chief Complaint Knee Pain    HPI Janet Sheppard is a 31 y.o. female who presents to the emergency department for evaluation of left knee pain and swelling since last week without known injury. Pain is worse with ambulation. No relief with over the counter medications.  Past Medical History:  Diagnosis Date  . Anemia   . Bronchitis   . Hypertension     There are no active problems to display for this patient.   History reviewed. No pertinent surgical history.  Prior to Admission medications   Medication Sig Start Date End Date Taking? Authorizing Provider  acetaminophen-codeine (TYLENOL #3) 300-30 MG tablet Take 1 tablet by mouth 2 (two) times daily as needed for moderate pain. 05/17/16   Jenise V Bacon Menshew, PA-C  azithromycin (ZITHROMAX Z-PAK) 250 MG tablet Take 2 tablets (500 mg) on  Day 1,  followed by 1 tablet (250 mg) once daily on Days 2 through 5. 01/10/16   Evon Slack, PA-C  baclofen (LIORESAL) 10 MG tablet Take 1 tablet (10 mg total) by mouth 3 (three) times daily. 08/15/16   [name] Pester, FNP  benzonatate (TESSALON PERLES) 100 MG capsule Take 1 capsule (100 mg total) by mouth 3 (three) times daily as needed for cough (Take 1-2 per dose). 05/17/16   Jenise V Bacon Menshew, PA-C  ciprofloxacin (CIPRO) 500 MG tablet Take 1 tablet (500 mg total) by mouth 2 (two) times daily. 02/17/15   Jene Every, MD  Diphenhyd-Hydrocort-Nystatin (FIRST-DUKES MOUTHWASH) SUSP Use as directed 10 mLs in the mouth or throat 4 (four) times daily. 06/10/16   Joni Reining, PA-C  famotidine (PEPCID) 20 MG tablet Take 1 tablet (20 mg total) by mouth 2 (two) times daily. 12/13/11 12/12/12  Elson Areas, PA-C  fexofenadine-pseudoephedrine (ALLEGRA-D) 60-120 MG 12 hr  tablet Take 1 tablet by mouth 2 (two) times daily. 06/10/16   Joni Reining, PA-C  fluconazole (DIFLUCAN) 150 MG tablet Take 1 tablet (150 mg total) by mouth once a week. 11/17/15   Delorise Royals Cuthriell, PA-C  hydrochlorothiazide (HYDRODIURIL) 25 MG tablet Take 25 mg by mouth daily.    Historical Provider, MD  HYDROcodone-acetaminophen (NORCO/VICODIN) 5-325 MG tablet Take 1 tablet by mouth every 4 (four) hours as needed for moderate pain. 09/15/15   [name] Pester, FNP  hydrocortisone cream 1 % Apply to affected area 2 times daily 12/08/13   Elwin Mocha, MD  ibuprofen (ADVIL,MOTRIN) 200 MG tablet Take 400 mg by mouth every 8 (eight) hours. For pain      Historical Provider, MD  Iron Combinations (IRON COMPLEX PO) Take 1 tablet by mouth daily.      Historical Provider, MD  meloxicam (MOBIC) 15 MG tablet Take 1 tablet (15 mg total) by mouth daily. 11/17/15   Delorise Royals Cuthriell, PA-C  Multiple Vitamin (MULTIVITAMIN PO) Take 1 tablet by mouth daily.      Historical Provider, MD  naproxen (NAPROSYN) 500 MG tablet Take 1 tablet (500 mg total) by mouth 2 (two) times daily with a meal. 08/15/16   Rachard Isidro B Orvel Cutsforth, FNP  omeprazole (PRILOSEC) 20 MG capsule Take 1 capsule (20 mg total) by mouth daily. 08/23/11 08/22/12  Emeline General, MD  phenazopyridine (PYRIDIUM) 95 MG tablet Take 1 tablet (95 mg total) by mouth 3 (three) times daily  as needed for pain. 11/17/15   Delorise Royals Cuthriell, PA-C  ranitidine (ZANTAC) 150 MG tablet Take 1 tablet (150 mg total) by mouth 2 (two) times daily. 12/31/12   Junious Silk, PA-C  sucralfate (CARAFATE) 1 GM/10ML suspension Take 10 mL (1 g total) by mouth 4 (four) times daily. 12/31/12   Junious Silk, PA-C  terbinafine (LAMISIL) 1 % cream Apply 1 application topically 2 (two) times daily. 11/17/15   Delorise Royals Cuthriell, PA-C  terbinafine (LAMISIL) 250 MG tablet Take 1 tablet (250 mg total) by mouth daily. 12/02/14   Joni Reining, PA-C    Allergies Doxycycline; Amoxicillin-pot  clavulanate; Cephalexin; Septra [bactrim]; and Tramadol  No family history on file.  Social History Social History  Substance Use Topics  . Smoking status: Former Smoker    Packs/day: 0.25    Types: Cigarettes  . Smokeless tobacco: Never Used  . Alcohol use Yes     Comment: occasional    Review of Systems Constitutional: No recent illness. Cardiovascular: Denies chest pain or palpitations. Respiratory: Denies shortness of breath. Musculoskeletal: Pain in left knee Skin: Negative for rash, wound, lesion. Neurological: Negative for focal weakness or numbness.  ____________________________________________   PHYSICAL EXAM:  VITAL SIGNS: ED Triage Vitals  Enc Vitals Group     BP 08/15/16 1918 (!) 153/100     Pulse Rate 08/15/16 1918 82     Resp 08/15/16 1918 18     Temp 08/15/16 1918 98 F (36.7 C)     Temp Source 08/15/16 1918 Oral     SpO2 08/15/16 1918 100 %     Weight 08/15/16 1916 (!) 360 lb (163.3 kg)     Height 08/15/16 1916  (1.753 m)     Head Circumference --      Peak Flow --      Pain Score 08/15/16 1916 9     Pain Loc --      Pain Edu? --      Excl. in GC? --     Constitutional: Alert and oriented. Well appearing and in no acute distress. Eyes: Conjunctivae are normal. EOMI. Head: Atraumatic. Neck: No stridor.  Respiratory: Normal respiratory effort.   Musculoskeletal: Tenderness and muscle tightness over the distal quadricept on palpation. Full ROM of the left knee. No obvious edema or joint effusion. Body habitus limits exam, but joint is stable. Neurologic:  Normal speech and language. No gross focal neurologic deficits are appreciated. Speech is normal. No gait instability. Skin:  Skin is warm, dry and intact. Atraumatic. Psychiatric: Mood and affect are normal. Speech and behavior are normal.  ____________________________________________   LABS (all labs ordered are listed, but only abnormal results are displayed)  Labs Reviewed - No  data to display ____________________________________________  RADIOLOGY  Left knee is negative for acute abnormality per radiology. ____________________________________________   PROCEDURES  Procedure(s) performed: None  ____________________________________________   INITIAL IMPRESSION / ASSESSMENT AND PLAN / ED COURSE  31 year old female presenting with atraumatic left knee pain. Joint is stable. She will be prescribed baclofen and naproxen and advised to follow up with primary care for symptoms that are not improving over the next few days. She was encouraged to return to the emergency department for symptoms that change or worsen if unable to schedule an appointment.  Pertinent labs & imaging results that were available during my care of the patient were reviewed by me and considered in my medical decision making (see chart for details).  _________________________________________  FINAL CLINICAL IMPRESSION(S) / ED DIAGNOSES  Final diagnoses:  Quadricep tightness    Discharge Medication List as of 08/15/2016  9:06 PM    START taking these medications   Details  baclofen (LIORESAL) 10 MG tablet Take 1 tablet (10 mg total) by mouth 3 (three) times daily., Starting Tue 08/15/2016, Print    naproxen (NAPROSYN) 500 MG tablet Take 1 tablet (500 mg total) by mouth 2 (two) times daily with a meal., Starting Tue 08/15/2016, Print        If controlled substance prescribed during this visit, 12 month history viewed on the [facility] prior to issuing an initial prescription for Schedule II or III opiod.    [name] Pester, FNP 08/18/16 1610    Myrna Blazer, MD 08/18/16 [phone]

## 2016-08-15 NOTE — ED Triage Notes (Signed)
Pt to triage via w/c with no distress noted; pt reports left knee pain & swelling since last week with no known injury

## 2016-10-16 ENCOUNTER — Emergency Department
Admission: EM | Admit: 2016-10-16 | Discharge: 2016-10-16 | Disposition: A | Discharge status: Home / Self Care | Payer: Self-pay | Attending: Emergency Medicine | Admitting: Emergency Medicine

## 2016-10-16 ENCOUNTER — Encounter: Payer: Self-pay | Admitting: Emergency Medicine

## 2016-10-16 DIAGNOSIS — I10 Essential (primary) hypertension: Secondary | ICD-10-CM | POA: Insufficient documentation

## 2016-10-16 DIAGNOSIS — Z79899 Other long term (current) drug therapy: Secondary | ICD-10-CM | POA: Insufficient documentation

## 2016-10-16 DIAGNOSIS — N898 Other specified noninflammatory disorders of vagina: Secondary | ICD-10-CM

## 2016-10-16 DIAGNOSIS — Z87891 Personal history of nicotine dependence: Secondary | ICD-10-CM | POA: Insufficient documentation

## 2016-10-16 DIAGNOSIS — K649 Unspecified hemorrhoids: Secondary | ICD-10-CM | POA: Insufficient documentation

## 2016-10-16 LAB — PREGNANCY, URINE: Preg Test, Ur: NEGATIVE

## 2016-10-16 LAB — COMPREHENSIVE METABOLIC PANEL
ALT: 23 U/L (ref 14–54)
ANION GAP: 7 (ref 5–15)
AST: 20 U/L (ref 15–41)
Albumin: 4.5 g/dL (ref 3.5–5.0)
Alkaline Phosphatase: 47 U/L (ref 38–126)
BUN: 13 mg/dL (ref 6–20)
CHLORIDE: 109 mmol/L (ref 101–111)
CO2: 24 mmol/L (ref 22–32)
Calcium: 9.4 mg/dL (ref 8.9–10.3)
Creatinine, Ser: 0.73 mg/dL (ref 0.44–1.00)
GFR calc non Af Amer: >60 mL/min (ref >60)
Glucose, Bld: 91 mg/dL (ref 65–99)
Potassium: 3.5 mmol/L (ref 3.5–5.1)
Sodium: 140 mmol/L (ref 135–145)
Total Bilirubin: 0.7 mg/dL (ref 0.3–1.2)
Total Protein: 6.9 g/dL (ref 6.5–8.1)

## 2016-10-16 LAB — CHLAMYDIA/NGC RT PCR (ARMC ONLY)
CHLAMYDIA TR: NOT DETECTED
N gonorrhoeae: NOT DETECTED

## 2016-10-16 LAB — CBC
HEMATOCRIT: 42.9 % (ref 35.0–47.0)
HEMOGLOBIN: 14.2 g/dL (ref 12.0–16.0)
MCH: 27.7 pg (ref 26.0–34.0)
MCHC: 33 g/dL (ref 32.0–36.0)
MCV: 83.9 fL (ref 80.0–100.0)
Platelets: 217 10*3/uL (ref 150–440)
RBC: 5.11 MIL/uL (ref 3.80–5.20)
RDW: 13.2 % (ref 11.5–14.5)
WBC: 7.3 10*3/uL (ref 3.6–11.0)

## 2016-10-16 LAB — URINALYSIS, COMPLETE (UACMP) WITH MICROSCOPIC
Bacteria, UA: NONE SEEN
Bilirubin Urine: NEGATIVE
GLUCOSE, UA: NEGATIVE mg/dL
HGB URINE DIPSTICK: NEGATIVE
Ketones, ur: 5 mg/dL — AB
Leukocytes, UA: NEGATIVE
NITRITE: NEGATIVE
PROTEIN: NEGATIVE mg/dL
Specific Gravity, Urine: 1.03 (ref 1.005–1.030)
pH: 5 (ref 5.0–8.0)

## 2016-10-16 LAB — TYPE AND SCREEN
ABO/RH(D): A POS
Antibody Screen: NEGATIVE

## 2016-10-16 LAB — WET PREP, GENITAL
CLUE CELLS WET PREP: NONE SEEN
Sperm: NONE SEEN
Trich, Wet Prep: NONE SEEN
Yeast Wet Prep HPF POC: NONE SEEN

## 2016-10-16 MED ORDER — CIPROFLOXACIN HCL 500 MG PO TABS
500.0000 mg | ORAL_TABLET | Freq: Once | ORAL | Status: AC
  Administered 2016-10-16: 500 mg via ORAL
  Filled 2016-10-16: qty 1

## 2016-10-16 MED ORDER — HYDROCORTISONE 2.5 % RE CREA: 1.0000 "application " | TOPICAL_CREAM | Freq: Two times a day (BID) | RECTAL | 0 refills | Status: DC

## 2016-10-16 MED ORDER — AZITHROMYCIN 500 MG PO TABS
1000.0000 mg | ORAL_TABLET | Freq: Once | ORAL | Status: AC
  Administered 2016-10-16: 1000 mg via ORAL
  Filled 2016-10-16: qty 2

## 2016-10-16 MED ORDER — DOXYCYCLINE HYCLATE 100 MG PO CAPS: 100.0000 mg | ORAL_CAPSULE | Freq: Two times a day (BID) | ORAL | 0 refills | Status: DC

## 2016-10-16 MED ORDER — METRONIDAZOLE 500 MG PO TABS: 500.0000 mg | ORAL_TABLET | Freq: Two times a day (BID) | ORAL | 0 refills | Status: DC

## 2016-10-16 NOTE — ED Triage Notes (Signed)
C/O vaginal discharge, pain with urination, and rectal bleeding x 2 weeks.

## 2016-10-16 NOTE — ED Provider Notes (Signed)
[facility] Emergency Department Provider Note       Time seen: ----------------------------------------- 8:16 PM on 10/16/2016 -----------------------------------------     I have reviewed the triage vital signs and the nursing notes.   HISTORY   Chief Complaint Dysuria; Vaginal Discharge; and Rectal Bleeding    HPI Janet Sheppard is a 31 y.o. female who presents to the ED for vaginal discharge, pain with urination and rectal bleeding for 2 weeks. Nothing makes her symptoms better, urination or defecation makes her symptoms worse. She has seen intermittent blood rectally when she wipes. It is also painful. She has never had these problems before. She reports having unprotected sex over the last month. She denies fevers, chills or other complaints.   Past Medical History:  Diagnosis Date  . Anemia   . Bronchitis   . Hypertension     There are no active problems to display for this patient.   History reviewed. No pertinent surgical history.  Allergies Doxycycline; Amoxicillin-pot clavulanate; Cephalexin; Septra [bactrim]; and Tramadol  Social History Social History  Substance Use Topics  . Smoking status: Former Smoker    Packs/day: 0.25    Types: Cigarettes  . Smokeless tobacco: Never Used  . Alcohol use Yes     Comment: occasional    Review of Systems Constitutional: Negative for fever. Eyes: Negative for vision changes ENT:  Negative for congestion, sore throat Cardiovascular: Negative for chest pain. Respiratory: Negative for shortness of breath. Gastrointestinal: Negative for abdominal pain, vomiting and diarrhea.Positive for rectal bleeding Genitourinary: Positive for dysuria and pelvic pain Musculoskeletal: Negative for back pain. Skin: Negative for rash. Neurological: Negative for headaches, focal weakness or numbness.  All systems negative/normal/unremarkable except as stated in the  HPI  ____________________________________________   PHYSICAL EXAM:  VITAL SIGNS: ED Triage Vitals  Enc Vitals Group     BP 10/16/16 1858 (!) 180/110     Pulse Rate 10/16/16 1858 80     Resp 10/16/16 1858 16     Temp 10/16/16 1858 99.1 F (37.3 C)     Temp Source 10/16/16 1858 Oral     SpO2 10/16/16 1858 100 %     Weight 10/16/16 1857 (!) 320 lb (145.2 kg)     Height 10/16/16 1857 5\' 9"  (1.753 m)     Head Circumference --      Peak Flow --      Pain Score 10/16/16 1856 8     Pain Loc --      Pain Edu? --      Excl. in GC? --    Constitutional: Alert and oriented. Well appearing and in no distress. Eyes: Conjunctivae are normal. Normal extraocular movements. Gastrointestinal: Soft and nontender. Rectal: Small tender external hemorrhoid. Not bleeding Genitourinary: Cervical motion tenderness, light vaginal discharge Musculoskeletal: Nontender with normal range of motion in extremities. No lower extremity tenderness nor edema. Neurologic:  Normal speech and language. No gross focal neurologic deficits are appreciated.  Skin:  Skin is warm, dry and intact. No rash noted. Psychiatric: Mood and affect are normal. Speech and behavior are normal.  ____________________________________________  ED COURSE:  Pertinent labs & imaging results that were available during my care of the patient were reviewed by me and considered in my medical decision making (see chart for details). Patient presents for likely STD or pelvic infection, we will assess with labs as indicated.   Procedures ____________________________________________   LABS (pertinent positives/negatives)  Labs Reviewed  WET PREP, GENITAL - Abnormal; Notable for  the following:       Result Value   WBC, Wet Prep HPF POC MODERATE (*)    All other components within normal limits  URINALYSIS, COMPLETE (UACMP) WITH MICROSCOPIC - Abnormal; Notable for the following:    Color, Urine AMBER (*)    APPearance CLOUDY (*)     Ketones, ur 5 (*)    Squamous Epithelial / LPF 0-5 (*)    All other components within normal limits  CHLAMYDIA/NGC RT PCR ([facility] ONLY)  COMPREHENSIVE METABOLIC PANEL  CBC  PREGNANCY, URINE  POC OCCULT BLOOD, ED  TYPE AND SCREEN  ____________________________________________  FINAL ASSESSMENT AND PLAN  Pelvic pain, cervicitis, External hemorrhoid  Plan: Patient's labs were dictated as above. Patient had presented for Pelvic pain and likely has an STD. She was started on antibiotic coverage. She is allergic to cephalosporins. She'll be discharged with doxycycline and Flagyl and is stable for outpatient follow-up. She will also be treated with Anusol HC rectally for hemorrhoids.   Emily Filbert, MD   Note: This note was generated in part or whole with voice recognition software. Voice recognition is usually quite accurate but there are transcription errors that can and very often do occur. I apologize for any typographical errors that were not detected and corrected.     Emily Filbert, MD 10/16/16 2059

## 2016-10-20 ENCOUNTER — Encounter: Payer: Self-pay | Admitting: Emergency Medicine

## 2016-10-20 ENCOUNTER — Emergency Department
Admission: EM | Admit: 2016-10-20 | Discharge: 2016-10-20 | Disposition: A | Discharge status: Home / Self Care | Payer: Self-pay | Attending: Emergency Medicine | Admitting: Emergency Medicine

## 2016-10-20 DIAGNOSIS — Z79899 Other long term (current) drug therapy: Secondary | ICD-10-CM | POA: Insufficient documentation

## 2016-10-20 DIAGNOSIS — Z87891 Personal history of nicotine dependence: Secondary | ICD-10-CM | POA: Insufficient documentation

## 2016-10-20 DIAGNOSIS — B349 Viral infection, unspecified: Secondary | ICD-10-CM | POA: Insufficient documentation

## 2016-10-20 DIAGNOSIS — I10 Essential (primary) hypertension: Secondary | ICD-10-CM | POA: Insufficient documentation

## 2016-10-20 LAB — URINALYSIS, COMPLETE (UACMP) WITH MICROSCOPIC
BACTERIA UA: NONE SEEN
Bilirubin Urine: NEGATIVE
Glucose, UA: NEGATIVE mg/dL
Ketones, ur: NEGATIVE mg/dL
LEUKOCYTES UA: NEGATIVE
Nitrite: NEGATIVE
PROTEIN: NEGATIVE mg/dL
Specific Gravity, Urine: 1.021 (ref 1.005–1.030)
pH: 5 (ref 5.0–8.0)

## 2016-10-20 MED ORDER — HYDROCODONE-ACETAMINOPHEN 5-325 MG PO TABS
1.0000 | ORAL_TABLET | Freq: Once | ORAL | Status: AC
  Administered 2016-10-20: 1 via ORAL
  Filled 2016-10-20: qty 1

## 2016-10-20 NOTE — Discharge Instructions (Signed)
Follow-up with your primary care doctor if any continued problems. Continue medication that you normally take. Increase fluids. You may also take ibuprofen as needed for body aches.

## 2016-10-20 NOTE — ED Provider Notes (Signed)
[facility] Emergency Department Provider Note ____________________________________________  Time seen: 11:19 AM  I have reviewed the triage vital signs and the nursing notes.  HISTORY  Chief Complaint  Generalized Body Aches and neck swelling   HPI Janet Sheppard is a 31 y.o. female is a complaint of generalized body aches. Patient states that she was seen recently for a "urinary tract infection" and continues to take antibiotics. She states that those symptoms have improved. She is unaware of any fever, denies nausea, vomiting or diarrhea. She is unaware of any other family members that are sick with same symptoms. Patient continues to eat and drink as normal. She rates her pain as 9/10.  She denies any tick bites.   Past Medical History:  Diagnosis Date  . Anemia   . Bronchitis   . Hypertension     There are no active problems to display for this patient.   History reviewed. No pertinent surgical history.  Prior to Admission medications   Medication Sig Start Date End Date Taking? Authorizing Provider  doxycycline (ADOXA) 100 MG tablet Take 100 mg by mouth 2 (two) times daily.   Yes [provider]  metroNIDAZOLE (FLAGYL) 500 MG tablet Take 500 mg by mouth 3 (three) times daily.   Yes [provider]  azithromycin (ZITHROMAX Z-PAK) 250 MG tablet Take 2 tablets (500 mg) on  Day 1,  followed by 1 tablet (250 mg) once daily on Days 2 through 5. 01/10/16   Evon SlackGaines, Thomas C, PA-C  baclofen (LIORESAL) 10 MG tablet Take 1 tablet (10 mg total) by mouth 3 (three) times daily. 08/15/16   Triplett, Rulon Eisenmengerari B, FNP  doxycycline (VIBRAMYCIN) 100 MG capsule Take 1 capsule (100 mg total) by mouth 2 (two) times daily. 10/16/16   Emily FilbertWilliams, Jonathan E, MD  famotidine (PEPCID) 20 MG tablet Take 1 tablet (20 mg total) by mouth 2 (two) times daily. 12/13/11 12/12/12  Elson AreasSofia, Leslie K, PA-C  hydrochlorothiazide (HYDRODIURIL) 25 MG tablet Take 25 mg by mouth daily.     [provider]  hydrocortisone (ANUSOL-HC) 2.5 % rectal cream Place 1 application rectally 2 (two) times daily. 10/16/16   Emily FilbertWilliams, Jonathan E, MD  hydrocortisone cream 1 % Apply to affected area 2 times daily 12/08/13   Elwin MochaWalden, Blair, MD  ibuprofen (ADVIL,MOTRIN) 200 MG tablet Take 400 mg by mouth every 8 (eight) hours. For pain      [provider]  Iron Combinations (IRON COMPLEX PO) Take 1 tablet by mouth daily.      [provider]  meloxicam (MOBIC) 15 MG tablet Take 1 tablet (15 mg total) by mouth daily. 11/17/15   Cuthriell, Delorise RoyalsJonathan D, PA-C  metroNIDAZOLE (FLAGYL) 500 MG tablet Take 1 tablet (500 mg total) by mouth 2 (two) times daily. 10/16/16   Emily FilbertWilliams, Jonathan E, MD  Multiple Vitamin (MULTIVITAMIN PO) Take 1 tablet by mouth daily.      [provider]  terbinafine (LAMISIL) 1 % cream Apply 1 application topically 2 (two) times daily. 11/17/15   Cuthriell, Delorise RoyalsJonathan D, PA-C  terbinafine (LAMISIL) 250 MG tablet Take 1 tablet (250 mg total) by mouth daily. 12/02/14   Joni ReiningSmith, Ronald K, PA-C    Allergies Doxycycline; Amoxicillin-pot clavulanate; Cephalexin; Septra [bactrim]; and Tramadol  No family history on file.  Social History Social History  Substance Use Topics  . Smoking status: Former Smoker    Packs/day: 0.25    Types: Cigarettes  . Smokeless tobacco: Never Used  . Alcohol  use Yes     Comment: occasional    Review of Systems  Constitutional: Negative for fever. Eyes: Negative for visual changes. ENT: Negative for sore throat. Cardiovascular: Negative for chest pain. Respiratory: Negative for shortness of breath. Gastrointestinal: Negative for abdominal pain, vomiting and diarrhea. Genitourinary: Negative for dysuria. Musculoskeletal: Positive for generalized muscle aches. Skin: Negative for rash. Neurological: Negative for headaches, focal weakness or numbness. ____________________________________________  PHYSICAL EXAM:  VITAL  SIGNS: ED Triage Vitals  Enc Vitals Group     BP 10/20/16 1109 (!) 149/65     Pulse Rate 10/20/16 1109 (!) 111     Resp 10/20/16 1109 16     Temp 10/20/16 1109 100.3 F (37.9 C)     Temp Source 10/20/16 1109 Oral     SpO2 10/20/16 1109 98 %     Weight 10/20/16 1111 (!) 320 lb (145.2 kg)     Height 10/20/16 1111 5\' 9"  (1.753 m)     Head Circumference --      Peak Flow --      Pain Score 10/20/16 1109 9     Pain Loc --      Pain Edu? --      Excl. in GC? --     Constitutional: Alert and oriented. Well appearing and in no distress. Head: Normocephalic and atraumatic. Eyes: Conjunctivae are normal.  Ears: Canals clear. TMs intact bilaterally. Nose: No congestion/rhinorrhea Mouth/Throat: Mucous membranes are moist. Neck: Supple.  Hematological/Lymphatic/Immunological: No cervical lymphadenopathy. Cardiovascular: Normal rate, regular rhythm. Normal distal pulses. Respiratory: Normal respiratory effort. No wheezes/rales/rhonchi. Gastrointestinal: Soft and nontender. No distention. Obese. Bowel sounds are active 4 quadrants. Musculoskeletal: Nontender with normal range of motion in all extremities.  Neurologic:  Normal gait without ataxia. Normal speech and language. No gross focal neurologic deficits are appreciated. Skin:  Skin is warm, dry and intact. No rash noted. Psychiatric: Mood and affect are normal. Patient exhibits appropriate insight and judgment. ____________________________________________   LABS (pertinent positives/negatives) Her urinalysis was negative for infection. ____________________________________________  INITIAL IMPRESSION / ASSESSMENT AND PLAN / ED COURSE  Patient was given Norco while in the department for her muscle aches. She is to increase fluids. She is to continue taking her antibiotic's until completely finished. She'll follow-up with her PCP in [place] if any continued problems.    ____________________________________________  FINAL CLINICAL  IMPRESSION(S) / ED DIAGNOSES  Final diagnoses:  Viral illness     Tommi Rumps, PA-C 10/20/16 1550    Pershing Proud Myra Rude, MD 10/21/16 [phone]

## 2016-10-20 NOTE — ED Notes (Signed)
See triage note  States she developed body aches   Sore throat and swollen glands weds night  Unsure of fever at home  Febrile on arrival

## 2016-10-20 NOTE — ED Notes (Signed)
Pt unable to void at this time. 

## 2016-10-20 NOTE — ED Triage Notes (Signed)
Body aches and neck swelling since Wednesday night.  Initially symptoms improved but returned yesterday afternoon.

## 2016-12-28 ENCOUNTER — Emergency Department: Payer: Self-pay

## 2016-12-28 ENCOUNTER — Emergency Department
Admission: EM | Admit: 2016-12-28 | Discharge: 2016-12-28 | Disposition: A | Discharge status: Home / Self Care | Payer: Self-pay | Attending: Emergency Medicine | Admitting: Emergency Medicine

## 2016-12-28 DIAGNOSIS — I10 Essential (primary) hypertension: Secondary | ICD-10-CM | POA: Insufficient documentation

## 2016-12-28 DIAGNOSIS — M79672 Pain in left foot: Secondary | ICD-10-CM | POA: Insufficient documentation

## 2016-12-28 DIAGNOSIS — Z79899 Other long term (current) drug therapy: Secondary | ICD-10-CM | POA: Insufficient documentation

## 2016-12-28 DIAGNOSIS — Z87891 Personal history of nicotine dependence: Secondary | ICD-10-CM | POA: Insufficient documentation

## 2016-12-28 LAB — CBC WITH DIFFERENTIAL/PLATELET
Basophils Absolute: 0.1 10*3/uL (ref 0–0.1)
Basophils Relative: 1 %
Eosinophils Absolute: 0.2 10*3/uL (ref 0–0.7)
Eosinophils Relative: 2 %
HCT: 42 % (ref 35.0–47.0)
Hemoglobin: 13.9 g/dL (ref 12.0–16.0)
Lymphocytes Relative: 38 %
Lymphs Abs: 2.7 10*3/uL (ref 1.0–3.6)
MCH: 27.3 pg (ref 26.0–34.0)
MCHC: 33.1 g/dL (ref 32.0–36.0)
MCV: 82.4 fL (ref 80.0–100.0)
Monocytes Absolute: 0.4 10*3/uL (ref 0.2–0.9)
Monocytes Relative: 5 %
Neutro Abs: 3.8 10*3/uL (ref 1.4–6.5)
Neutrophils Relative %: 54 %
Platelets: 230 10*3/uL (ref 150–440)
RBC: 5.09 MIL/uL (ref 3.80–5.20)
RDW: 13.9 % (ref 11.5–14.5)
WBC: 7.1 10*3/uL (ref 3.6–11.0)

## 2016-12-28 LAB — COMPREHENSIVE METABOLIC PANEL
ALT: 21 U/L (ref 14–54)
AST: 18 U/L (ref 15–41)
Albumin: 4.2 g/dL (ref 3.5–5.0)
Alkaline Phosphatase: 48 U/L (ref 38–126)
Anion gap: 8 (ref 5–15)
BUN: 10 mg/dL (ref 6–20)
CO2: 24 mmol/L (ref 22–32)
Calcium: 9.2 mg/dL (ref 8.9–10.3)
Chloride: 110 mmol/L (ref 101–111)
Creatinine, Ser: 0.68 mg/dL (ref 0.44–1.00)
GFR calc Af Amer: >60 mL/min (ref >60)
GFR calc non Af Amer: >60 mL/min (ref >60)
Glucose, Bld: 102 mg/dL — ABNORMAL HIGH (ref 65–99)
Potassium: 3.5 mmol/L (ref 3.5–5.1)
Sodium: 142 mmol/L (ref 135–145)
Total Bilirubin: 0.9 mg/dL (ref 0.3–1.2)
Total Protein: 6.7 g/dL (ref 6.5–8.1)

## 2016-12-28 LAB — PROTIME-INR
INR: 1.01
Prothrombin Time: 13.3 seconds (ref 11.4–15.2)

## 2016-12-28 LAB — URIC ACID: Uric Acid, Serum: 5.5 mg/dL (ref 2.3–6.6)

## 2016-12-28 MED ORDER — TRAMADOL HCL 50 MG PO TABS: 50.0000 mg | ORAL_TABLET | Freq: Four times a day (QID) | ORAL | 0 refills | Status: AC | PRN

## 2016-12-28 MED ORDER — TRAMADOL HCL 50 MG PO TABS
50.0000 mg | ORAL_TABLET | Freq: Once | ORAL | Status: AC
  Administered 2016-12-28: 50 mg via ORAL
  Filled 2016-12-28: qty 1

## 2016-12-28 NOTE — ED Triage Notes (Signed)
Pt reports left foot pain and swelling for one month. Pt states has seen PCP and taken prednisone and clindamycin. Pt states they told her she may have gout. Pt ambulatory to triage.

## 2016-12-28 NOTE — ED Notes (Signed)
See triage note  States she has had left foot pain  For about 1 month   Was seen by her PCP and told she had gout  Was started on prednisone and clinda  States she finished the meds but still is having pain to left   Ambulates with slight limp d/t pain

## 2016-12-28 NOTE — ED Provider Notes (Signed)
[facility] Emergency Department Provider Note  ____________________________________________  Time seen: Approximately 8:49 PM  I have reviewed the triage vital signs and the nursing notes.   HISTORY  Chief Complaint Foot Pain    HPI Janet Sheppard is a 31 y.o. female presenting to the emergency department with left foot pain for approximately 1 month. Patient was initially diagnosed with cellulitis and was treated with clindamycin. When cellulitis seemingly resolved, patient still had residual pain. Patient was then started on prednisone. Patient states that her left foot pain has persisted. Patient states that her pain is worsened with ambulation. Patient's left foot pain is 10 out of 10 in intensity and largely distributed among the left metatarsals with some radiation into the left calf. Patient has never had a blood clot or PE before. She has a history of obesity but denies daily smoking, recent travel, prolonged immobilization, knowledge of current malignancy or recent surgery. Patient denies chest pain, chest tightness, shortness of breath, nausea, vomiting and abdominal pain.   Past Medical History:  Diagnosis Date  . Anemia   . Bronchitis   . Hypertension     There are no active problems to display for this patient.   No past surgical history on file.  Prior to Admission medications   Medication Sig Start Date End Date Taking? Authorizing Provider  azithromycin (ZITHROMAX Z-PAK) 250 MG tablet Take 2 tablets (500 mg) on  Day 1,  followed by 1 tablet (250 mg) once daily on Days 2 through 5. 01/10/16   Evon Slack, PA-C  baclofen (LIORESAL) 10 MG tablet Take 1 tablet (10 mg total) by mouth 3 (three) times daily. 08/15/16   Triplett, Rulon Eisenmenger B, FNP  doxycycline (ADOXA) 100 MG tablet Take 100 mg by mouth 2 (two) times daily.    [provider]  doxycycline (VIBRAMYCIN) 100 MG capsule Take 1 capsule (100 mg total) by mouth 2 (two) times daily.  10/16/16   Emily Filbert, MD  famotidine (PEPCID) 20 MG tablet Take 1 tablet (20 mg total) by mouth 2 (two) times daily. 12/13/11 12/12/12  Elson Areas, PA-C  hydrochlorothiazide (HYDRODIURIL) 25 MG tablet Take 25 mg by mouth daily.    [provider]  hydrocortisone (ANUSOL-HC) 2.5 % rectal cream Place 1 application rectally 2 (two) times daily. 10/16/16   Emily Filbert, MD  hydrocortisone cream 1 % Apply to affected area 2 times daily 12/08/13   Elwin Mocha, MD  ibuprofen (ADVIL,MOTRIN) 200 MG tablet Take 400 mg by mouth every 8 (eight) hours. For pain      [provider]  Iron Combinations (IRON COMPLEX PO) Take 1 tablet by mouth daily.      [provider]  meloxicam (MOBIC) 15 MG tablet Take 1 tablet (15 mg total) by mouth daily. 11/17/15   Cuthriell, Delorise Royals, PA-C  metroNIDAZOLE (FLAGYL) 500 MG tablet Take 1 tablet (500 mg total) by mouth 2 (two) times daily. 10/16/16   Emily Filbert, MD  metroNIDAZOLE (FLAGYL) 500 MG tablet Take 500 mg by mouth 3 (three) times daily.    [provider]  Multiple Vitamin (MULTIVITAMIN PO) Take 1 tablet by mouth daily.      [provider]  terbinafine (LAMISIL) 1 % cream Apply 1 application topically 2 (two) times daily. 11/17/15   Cuthriell, Delorise Royals, PA-C  terbinafine (LAMISIL) 250 MG tablet Take 1 tablet (250 mg total) by mouth daily. 12/02/14   Joni Reining, PA-C  traMADol (  ULTRAM) 50 MG tablet Take 1 tablet (50 mg total) by mouth every 6 (six) hours as needed. 12/28/16 12/31/16  Orvil Feil, PA-C    Allergies Doxycycline; Amoxicillin-pot clavulanate; Cephalexin; Septra [bactrim]; and Tramadol  No family history on file.  Social History Social History  Substance Use Topics  . Smoking status: Former Smoker    Packs/day: 0.25    Types: Cigarettes  . Smokeless tobacco: Never Used  . Alcohol use Yes     Comment: occasional     Review of Systems  Constitutional: No  fever/chills Eyes: No visual changes. No discharge ENT: No upper respiratory complaints. Cardiovascular: no chest pain. Respiratory: no cough. No SOB. Gastrointestinal: No abdominal pain.  No nausea, no vomiting.  No diarrhea.  No constipation. Musculoskeletal: Patient has left foot pain Skin: Darkened epidermis along distribution of left foot. Neurological: Negative for headaches, focal weakness or numbness.   ____________________________________________   PHYSICAL EXAM:  VITAL SIGNS: ED Triage Vitals [12/28/16 1820]  Enc Vitals Group     BP (!) 193/102     Pulse Rate 85     Resp 18     Temp 98.7 F (37.1 C)     Temp Source Oral     SpO2 100 %     Weight (!) 370 lb (167.8 kg)     Height 5\' 9"  (1.753 m)     Head Circumference      Peak Flow      Pain Score 9     Pain Loc      Pain Edu?      Excl. in GC?      Constitutional: Alert and oriented. Well appearing and in no acute distress. Eyes: Conjunctivae are normal. PERRL. EOMI. Head: Atraumatic. Cardiovascular: Normal rate, regular rhythm. Normal S1 and S2.  Good peripheral circulation. Respiratory: Normal respiratory effort without tachypnea or retractions. Lungs CTAB. Good air entry to the bases with no decreased or absent breath sounds. Musculoskeletal: Full range of motion to all extremities. No gross deformities appreciated. Neurologic:  Normal speech and language. No gross focal neurologic deficits are appreciated.  Skin: Skin is able to perform flexion and extension at the left ankle. She is able to move all 5 left toes. Patient has a 3 cm region of the darkened epidermis along the distribution of the metatarsals. No surrounding erythema or edema. Patient has tenderness with palpation of the calf. Palpable dorsalis pedis pulses, left. Psychiatric: Mood and affect are normal. Speech and behavior are normal. Patient exhibits appropriate insight and judgement.   ____________________________________________    LABS (all labs ordered are listed, but only abnormal results are displayed)  Labs Reviewed  COMPREHENSIVE METABOLIC PANEL - Abnormal; Notable for the following:       Result Value   Glucose, Bld 102 (*)    All other components within normal limits  CBC WITH DIFFERENTIAL/PLATELET  URIC ACID  PROTIME-INR   ____________________________________________  EKG   ____________________________________________  RADIOLOGY Geraldo Pitter, personally viewed and evaluated these images (plain radiographs) as part of my medical decision making, as well as reviewing the written report by the radiologist.  US Venous Img Lower Unilateral Left  Result Date: 12/28/2016 CLINICAL DATA:  Left foot pain and swelling /edema EXAM: Left LOWER EXTREMITY VENOUS DOPPLER ULTRASOUND TECHNIQUE: Gray-scale sonography with graded compression, as well as color Doppler and duplex ultrasound were performed to evaluate the lower extremity deep venous systems from the level of the common femoral vein and including the  common femoral, femoral, profunda femoral, popliteal and calf veins including the posterior tibial, peroneal and gastrocnemius veins when visible. The superficial great saphenous vein was also interrogated. Spectral Doppler was utilized to evaluate flow at rest and with distal augmentation maneuvers in the common femoral, femoral and popliteal veins. COMPARISON:  Radiographs 12/28/2016 FINDINGS: Contralateral Common Femoral Vein: Respiratory phasicity is normal and symmetric with the symptomatic side. No evidence of thrombus. Normal compressibility. Common Femoral Vein: No evidence of thrombus. Normal compressibility, respiratory phasicity and response to augmentation. Saphenofemoral Junction: No evidence of thrombus. Normal compressibility and flow on color Doppler imaging. Profunda Femoral Vein: No evidence of thrombus. Normal compressibility and flow on color Doppler imaging. Femoral Vein: No evidence of  thrombus. Normal compressibility, respiratory phasicity and response to augmentation. Popliteal Vein: No evidence of thrombus. Normal compressibility, respiratory phasicity and response to augmentation. Calf Veins: No evidence of thrombus. Normal compressibility and flow on color Doppler imaging. Other Findings:  None. IMPRESSION: No evidence of DVT within the left lower extremity. Electronically Signed   By: Jasmine PangKim  Fujinaga M.D.   On: 12/28/2016 21:10   Dg Foot Complete Left  Result Date: 12/28/2016 CLINICAL DATA:  Left foot pain and swelling for 1 month EXAM: LEFT FOOT - COMPLETE 3+ VIEW COMPARISON:  None. FINDINGS: There is no evidence of fracture or dislocation. There is no evidence of arthropathy or other focal bone abnormality. Soft tissues are unremarkable. IMPRESSION: Negative. Electronically Signed   By: Jasmine PangKim  Fujinaga M.D.   On: 12/28/2016 20:32    ____________________________________________    PROCEDURES  Procedure(s) performed:    Procedures    Medications  traMADol (ULTRAM) tablet 50 mg (50 mg Oral Given 12/28/16 2248)     ____________________________________________   INITIAL IMPRESSION / ASSESSMENT AND PLAN / ED COURSE  Pertinent labs & imaging results that were available during my care of the patient were reviewed by me and considered in my medical decision making (see chart for details).  Review of the [place] CSRS was performed in accordance of the [facility] prior to dispensing any controlled drugs.     Assessment and Plan:  Left foot pain Patient presents to the emergency department with left foot pain along the distribution of the metatarsals. DG foot complete reveals no acute fractures or bony abnormalities. Ultrasound examination conducted in the emergency department was noncontributory for thromboembolism. CBC, CMP, pro time INR and uric acid levels were also reassuring. Patient was referred to podiatry, Dr. Orland Jarredroxler. She was discharged with a short course of tramadol.  All patient questions were answered.   ____________________________________________  FINAL CLINICAL IMPRESSION(S) / ED DIAGNOSES  Final diagnoses:  Foot pain, left      NEW MEDICATIONS STARTED DURING THIS VISIT:  New Prescriptions   TRAMADOL (ULTRAM) 50 MG TABLET    Take 1 tablet (50 mg total) by mouth every 6 (six) hours as needed.        This chart was dictated using voice recognition software/Dragon. Despite best efforts to proofread, errors can occur which can change the meaning. Any change was purely unintentional.    Orvil FeilWoods, Nolon Yellin M, PA-C 12/28/16 2249    Phineas SemenGoodman, [place], MD 12/28/16 2259

## 2016-12-28 NOTE — ED Notes (Signed)
Pt gone to US

## 2017-08-16 ENCOUNTER — Emergency Department
Admission: EM | Admit: 2017-08-16 | Discharge: 2017-08-16 | Disposition: A | Discharge status: Home / Self Care | Payer: Self-pay | Attending: Emergency Medicine | Admitting: Emergency Medicine

## 2017-08-16 ENCOUNTER — Encounter: Payer: Self-pay | Admitting: Emergency Medicine

## 2017-08-16 DIAGNOSIS — I10 Essential (primary) hypertension: Secondary | ICD-10-CM | POA: Insufficient documentation

## 2017-08-16 DIAGNOSIS — Z87891 Personal history of nicotine dependence: Secondary | ICD-10-CM | POA: Insufficient documentation

## 2017-08-16 DIAGNOSIS — B353 Tinea pedis: Secondary | ICD-10-CM | POA: Insufficient documentation

## 2017-08-16 DIAGNOSIS — Z79899 Other long term (current) drug therapy: Secondary | ICD-10-CM | POA: Insufficient documentation

## 2017-08-16 MED ORDER — CLOTRIMAZOLE 1 % EX CREA: 1.0000 "application " | TOPICAL_CREAM | Freq: Two times a day (BID) | CUTANEOUS | 0 refills | Status: AC

## 2017-08-16 MED ORDER — TRAMADOL HCL 50 MG PO TABS
100.0000 mg | ORAL_TABLET | Freq: Once | ORAL | Status: AC
  Administered 2017-08-16: 100 mg via ORAL
  Filled 2017-08-16: qty 2

## 2017-08-16 NOTE — ED Triage Notes (Signed)
Pt arrived with complaints of lower leg pain and swelling. Pt states it started a few ago but today she was "fet up with it." Pt has full ROM with both lower extremities. Both pedal pulses palpable. Pt has rash seen on both feet bilaterally on top of toes. Feet extremely sensitive to touch.

## 2017-08-16 NOTE — ED Provider Notes (Signed)
[facility] Emergency Department Provider Note  Time seen: 7:10 PM  I have reviewed the triage vital signs and the nursing notes.   HISTORY  Chief Complaint Foot Pain    HPI Janet Sheppard is a 32 y.o. female with a past medical history of hypertension, anemia, presents to the emergency department for bilateral foot pain and rash.  Patient states the foot pain and rash has been ongoing for at least one year, likely longer.  Patient was seen in the emergency department in August for the same, had a negative workup was referred to podiatry but the patient never followed up.  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  Denies any fever.  Largely negative review of systems.  Patient states the rash has been present the entire time sometimes it will be crusting and weeping currently it is very dry appearing.   Past Medical History:  Diagnosis Date  . Anemia   . Bronchitis   . Hypertension     There are no active problems to display for this patient.   History reviewed. No pertinent surgical history.  Prior to Admission medications   Medication Sig Start Date End Date Taking? Authorizing Provider  azithromycin (ZITHROMAX Z-PAK) 250 MG tablet Take 2 tablets (500 mg) on  Day 1,  followed by 1 tablet (250 mg) once daily on Days 2 through 5. 01/10/16   Evon SlackGaines, Thomas C, PA-C  baclofen (LIORESAL) 10 MG tablet Take 1 tablet (10 mg total) by mouth 3 (three) times daily. 08/15/16   Triplett, Rulon Eisenmengerari B, FNP  doxycycline (ADOXA) 100 MG tablet Take 100 mg by mouth 2 (two) times daily.    [provider]  doxycycline (VIBRAMYCIN) 100 MG capsule Take 1 capsule (100 mg total) by mouth 2 (two) times daily. 10/16/16   Emily FilbertWilliams, Jonathan E, MD  famotidine (PEPCID) 20 MG tablet Take 1 tablet (20 mg total) by mouth 2 (two) times daily. 12/13/11 12/12/12  Elson AreasSofia, Leslie K,  PA-C  hydrochlorothiazide (HYDRODIURIL) 25 MG tablet Take 25 mg by mouth daily.    [provider]  hydrocortisone (ANUSOL-HC) 2.5 % rectal cream Place 1 application rectally 2 (two) times daily. 10/16/16   Emily FilbertWilliams, Jonathan E, MD  hydrocortisone cream 1 % Apply to affected area 2 times daily 12/08/13   Elwin MochaWalden, Blair, MD  ibuprofen (ADVIL,MOTRIN) 200 MG tablet Take 400 mg by mouth every 8 (eight) hours. For pain      [provider]  Iron Combinations (IRON COMPLEX PO) Take 1 tablet by mouth daily.      [provider]  meloxicam (MOBIC) 15 MG tablet Take 1 tablet (15 mg total) by mouth daily. 11/17/15   Cuthriell, Delorise RoyalsJonathan D, PA-C  metroNIDAZOLE (FLAGYL) 500 MG tablet Take 1 tablet (500 mg total) by mouth 2 (two) times daily. 10/16/16   Emily FilbertWilliams, Jonathan E, MD  metroNIDAZOLE (FLAGYL) 500 MG tablet Take 500 mg by mouth 3 (three) times daily.    [provider]  Multiple Vitamin (MULTIVITAMIN PO) Take 1 tablet by mouth daily.      [provider]  terbinafine (LAMISIL) 1 % cream Apply 1 application topically 2 (two) times daily. 11/17/15   Cuthriell, Delorise RoyalsJonathan D, PA-C  terbinafine (LAMISIL) 250 MG tablet Take 1 tablet (250 mg total) by mouth daily. 12/02/14   Joni ReiningSmith, Ronald K, PA-C    Allergies  Allergen  Reactions  . Doxycycline   . Amoxicillin-Pot Clavulanate Rash  . Cephalexin Rash  . Septra [Bactrim] Rash  . Tramadol Rash    No family history on file.  Social History Social History   Tobacco Use  . Smoking status: Former Smoker    Packs/day: 0.25    Types: Cigarettes  . Smokeless tobacco: Never Used  Substance Use Topics  . Alcohol use: Yes    Comment: occasional  . Drug use: No    Review of Systems Constitutional: Negative for fever. Eyes: Negative for visual complaints ENT: Negative for recent illness/congestion Cardiovascular: Negative for chest pain. Respiratory: Negative for shortness of breath. Gastrointestinal: Negative for  abdominal pain, vomiting had positive for nausea with the pain. Genitourinary: Negative for urinary compaints Musculoskeletal: Positive bilateral foot pain.  Chronic knee pain bilaterally for which she is prescribed tramadol. Skin: Rash over bilateral feet. Neurological: Negative for headache All other ROS negative  ____________________________________________   PHYSICAL EXAM:  VITAL SIGNS: ED Triage Vitals  Enc Vitals Group     BP 08/16/17 1805 (!) 170/116     Pulse Rate 08/16/17 1759 91     Resp 08/16/17 1759 18     Temp 08/16/17 1759 98.6 F (37 C)     Temp Source 08/16/17 1759 Oral     SpO2 08/16/17 1759 100 %     Weight 08/16/17 1805 (!) 368 lb (166.9 kg)     Height 08/16/17 1805 5\' 9"  (1.753 m)     Head Circumference --      Peak Flow --      Pain Score 08/16/17 1805 9     Pain Loc --      Pain Edu? --      Excl. in GC? --     Constitutional: Alert and oriented. Well appearing and in no distress. Eyes: Normal exam ENT   Head: Normocephalic and atraumatic.   Mouth/Throat: Mucous membranes are moist. Cardiovascular: Normal rate, regular rhythm. No murmur Respiratory: Normal respiratory effort without tachypnea nor retractions. Breath sounds are clear Gastrointestinal: Soft and nontender. No distention.  Obese. Musculoskeletal: Patient has significant tenderness to bilateral feet, tender to palpation nearly all locations on the feet.  Patient has very dry appearing rash especially between the toes and along the edges of the plantar aspect of the foot.  Overall most consistent with a fungal infection.  No signs of cellulitis or cysts superinfection at this time. Neurologic:  Normal speech and language. No gross focal neurologic deficits are appreciated. Skin:  Skin is warm, dry.  Dry appearing rash to bilateral feet most consistent with tinea pedis. Psychiatric: Mood and affect are normal.  ____________________________________________   INITIAL IMPRESSION /  ASSESSMENT AND PLAN / ED COURSE  Pertinent labs & imaging results that were available during my care of the patient were reviewed by me and considered in my medical decision making (see chart for details).  Patient presents to the emergency department for rash and pain to bilateral feet.  Patient rash is most consistent with tinea pedis.  States is been ongoing for 1 year.  Did not follow-up with podiatry.  We will refer to podiatry for further evaluation.  Patient has not used any creams or ointments to the feet.  We will prescribe clotrimazole cream to be used twice daily for the next 4 weeks.  I discussed with the patient also trying to keep them as dry as possible, taking her shoes off when she is home.  Patient is  prescribed chronic tramadol, last filled 07/22/17.  We will dose a one-time dose of pain medication in the emergency department but will not discharge with narcotics as the patient is currently on pain medication at home.  Patient agreeable to this plan of care.  Will follow up with podiatry.  ____________________________________________   FINAL CLINICAL IMPRESSION(S) / ED DIAGNOSES  Bilateral foot pain Tinea pedis    Minna Antis, MD 08/16/17 [phone]

## 2017-08-16 NOTE — ED Notes (Signed)
Patient to lobby in wheelchair with NAD noted. Verbalized understanding of discharge instructions and follow-up care.   

## 2018-01-03 IMAGING — CR DG KNEE COMPLETE 4+V*L*
4 series · 4 of 4 positions shown · non-contrast
Comparison: None.

CLINICAL DATA: Left knee pain which began 2 weeks ago.

EXAM:
LEFT KNEE - COMPLETE 4+ VIEW

[knee ap (1 of 2)]
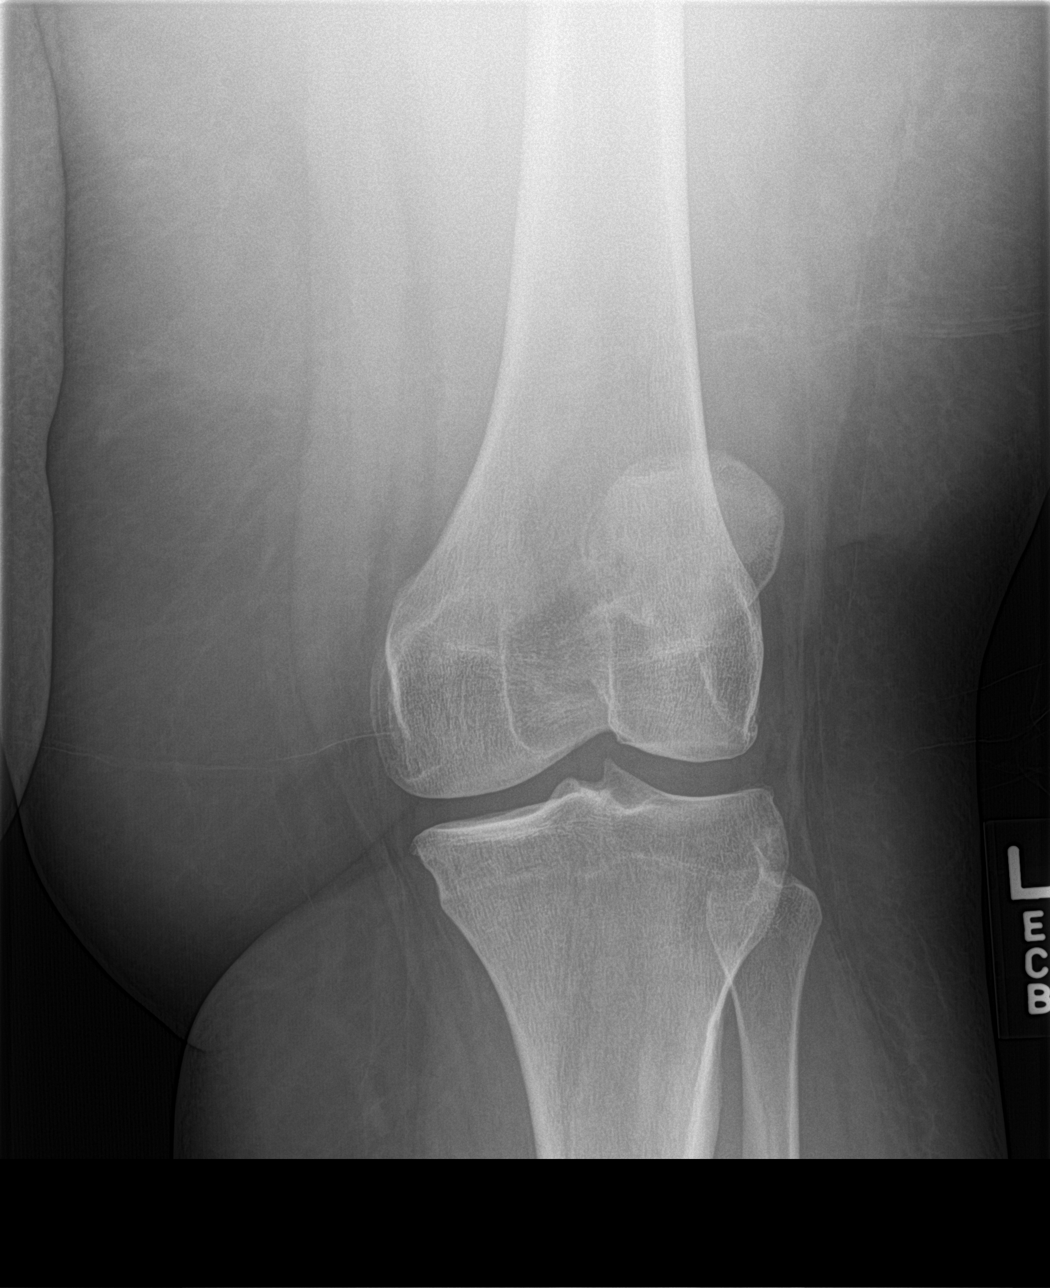

[knee lat]
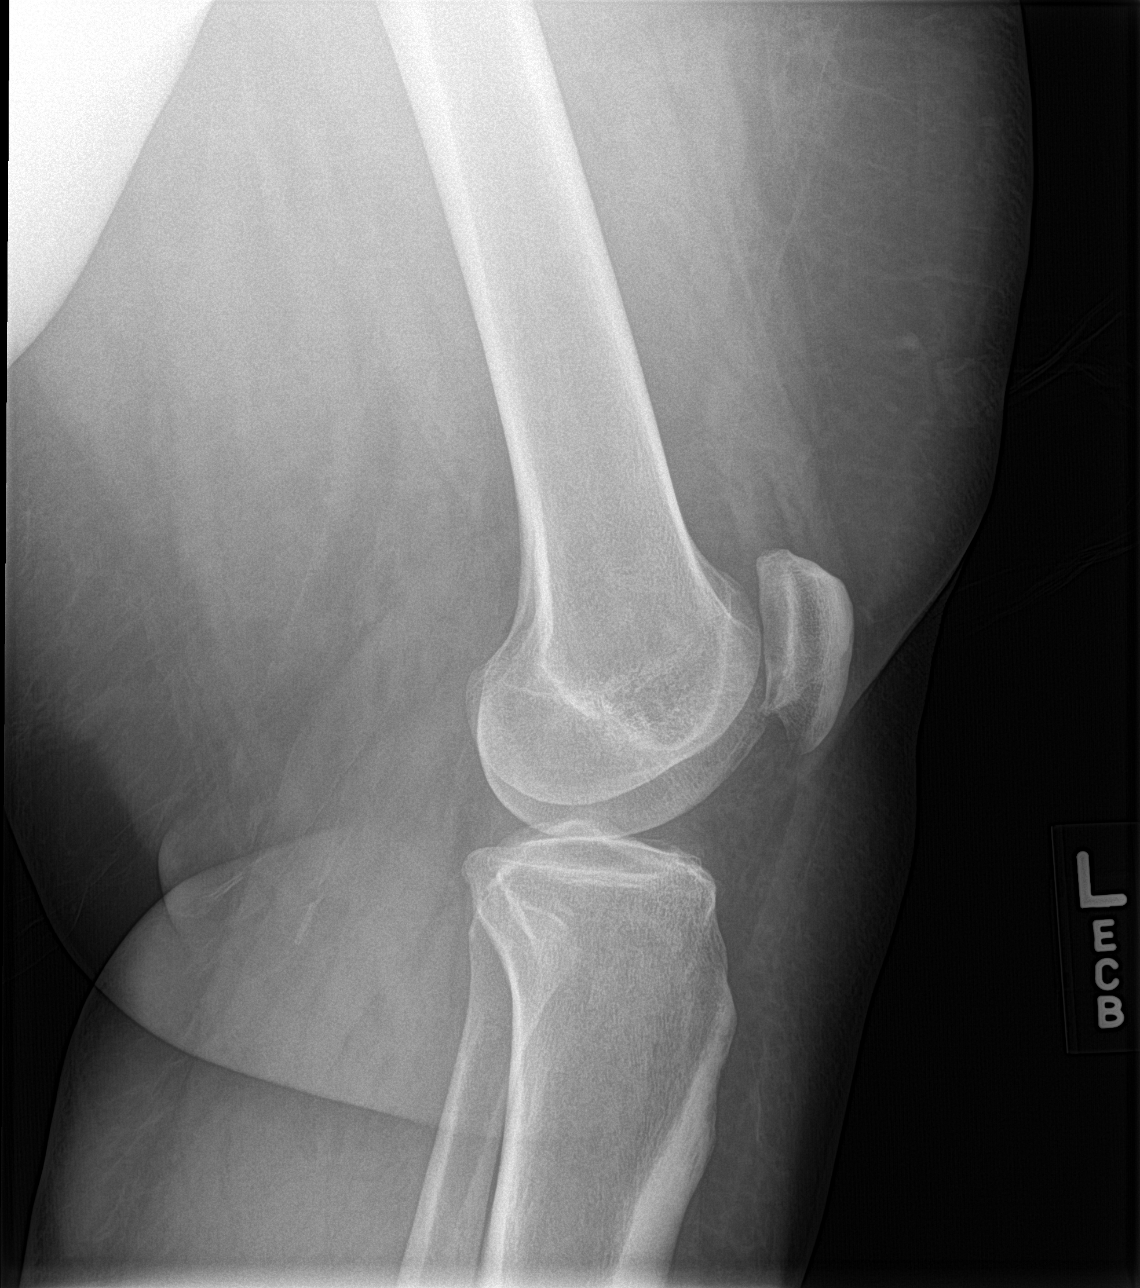

[sunrise]
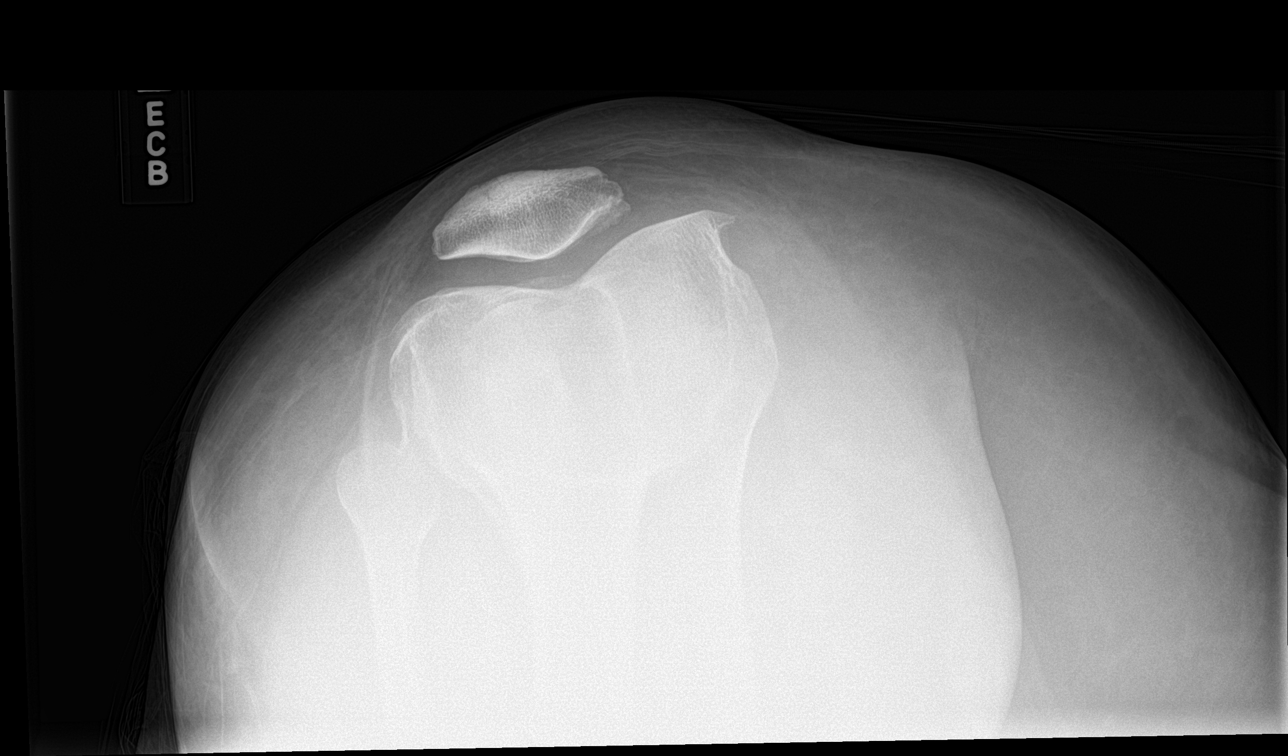

[knee ap (2 of 2)]
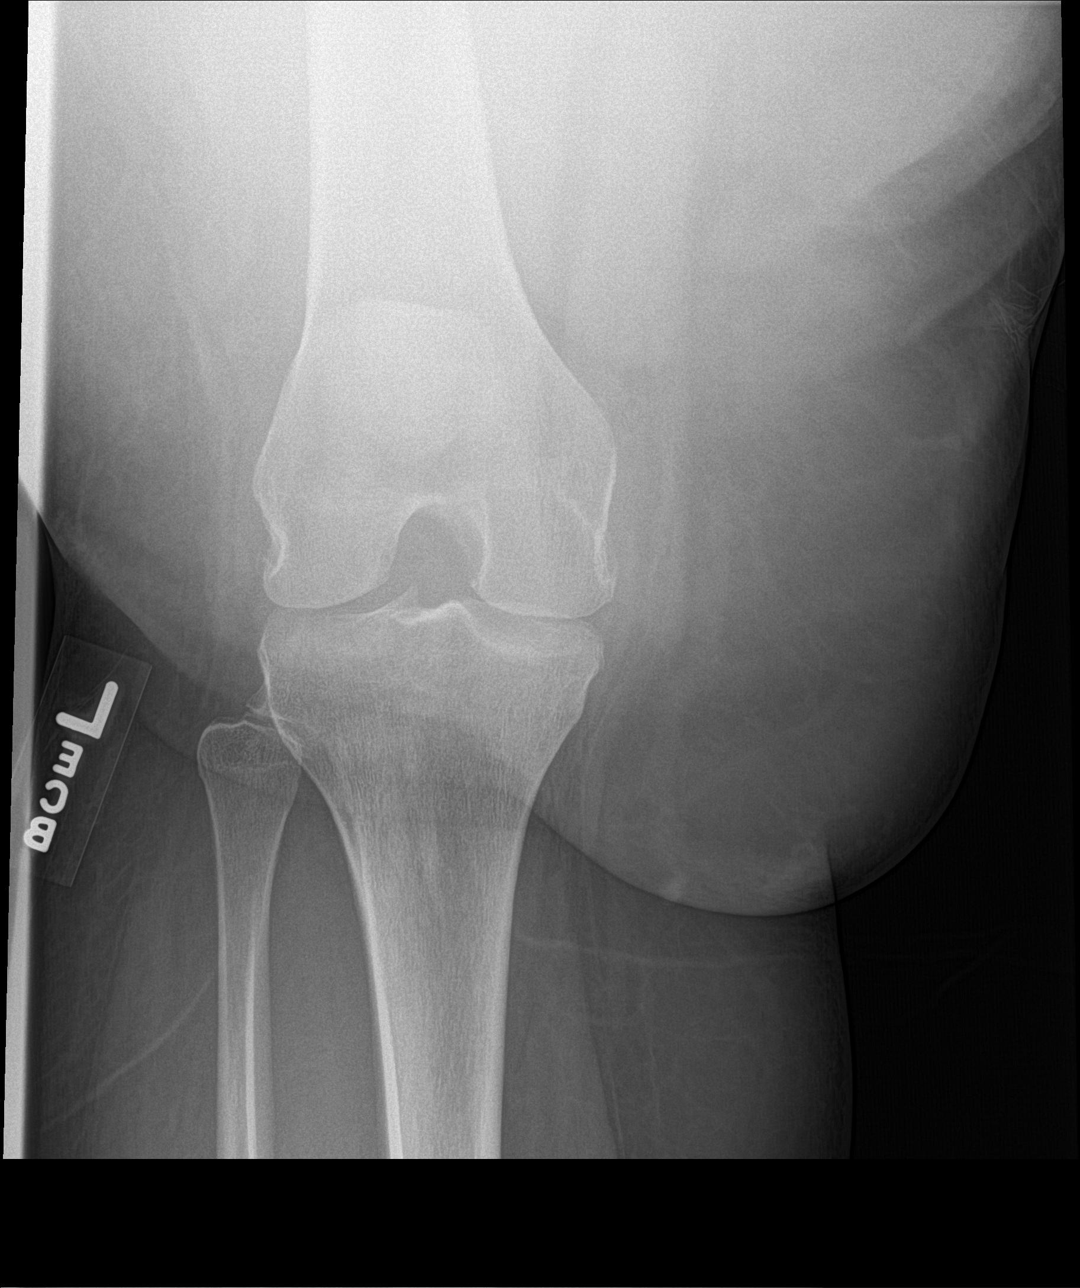

[4 of 4 positions shown; findings below may reference images not displayed]

FINDINGS: No evidence of fracture, dislocation, or joint effusion. There is
marginal spur formation associated with the patellofemoral
compartment. Mild sharpening of the tibial spines noted. Soft
tissues are unremarkable.
IMPRESSION: 1. No acute findings.
2. Mild osteoarthritis.

## 2018-06-06 ENCOUNTER — Other Ambulatory Visit: Payer: Self-pay

## 2018-06-06 ENCOUNTER — Emergency Department
Admission: EM | Admit: 2018-06-06 | Discharge: 2018-06-06 | Disposition: A | Discharge status: Home / Self Care | Payer: Self-pay | Attending: Student in an Organized Health Care Education/Training Program | Admitting: Student in an Organized Health Care Education/Training Program

## 2018-06-06 ENCOUNTER — Encounter: Payer: Self-pay | Admitting: Emergency Medicine

## 2018-06-06 DIAGNOSIS — I10 Essential (primary) hypertension: Secondary | ICD-10-CM | POA: Insufficient documentation

## 2018-06-06 DIAGNOSIS — K047 Periapical abscess without sinus: Secondary | ICD-10-CM | POA: Insufficient documentation

## 2018-06-06 DIAGNOSIS — Z79899 Other long term (current) drug therapy: Secondary | ICD-10-CM | POA: Insufficient documentation

## 2018-06-06 DIAGNOSIS — Z87891 Personal history of nicotine dependence: Secondary | ICD-10-CM | POA: Insufficient documentation

## 2018-06-06 MED ORDER — HYDROCODONE-ACETAMINOPHEN 5-325 MG PO TABS: 1.0000 | ORAL_TABLET | Freq: Four times a day (QID) | ORAL | 0 refills | Status: DC | PRN

## 2018-06-06 MED ORDER — CLINDAMYCIN HCL 300 MG PO CAPS: 300.0000 mg | ORAL_CAPSULE | Freq: Three times a day (TID) | ORAL | 0 refills | Status: DC

## 2018-06-06 MED ORDER — CLINDAMYCIN HCL 150 MG PO CAPS
300.0000 mg | ORAL_CAPSULE | Freq: Once | ORAL | Status: AC
  Administered 2018-06-06: 300 mg via ORAL
  Filled 2018-06-06: qty 2

## 2018-06-06 MED ORDER — HYDROCODONE-ACETAMINOPHEN 5-325 MG PO TABS: 1.0000 | ORAL_TABLET | Freq: Four times a day (QID) | ORAL | 0 refills | Status: AC | PRN

## 2018-06-06 MED ORDER — HYDROCODONE-ACETAMINOPHEN 5-325 MG PO TABS
1.0000 | ORAL_TABLET | Freq: Once | ORAL | Status: AC
  Administered 2018-06-06: 1 via ORAL
  Filled 2018-06-06: qty 1

## 2018-06-06 MED ORDER — CLINDAMYCIN HCL 300 MG PO CAPS: 300.0000 mg | ORAL_CAPSULE | Freq: Three times a day (TID) | ORAL | 0 refills | Status: AC

## 2018-06-06 NOTE — ED Triage Notes (Signed)
First RN Note: Pt c/o possible dental abscess at this time.

## 2018-06-06 NOTE — ED Notes (Signed)
Pt has not been taking her bp meds x 1 year

## 2018-06-06 NOTE — Discharge Instructions (Signed)
Please call and schedule a dental appointment as soon as possible. You will need to be seen within the next 14 days. Return to the emergency department for symptoms that change or worsen if you're unable to schedule an appointment.  OPTIONS FOR DENTAL FOLLOW UP CARE  [place] Department of Health and Human Services - Local Safety Net Dental Clinics http://www.ncdhhs.gov/dph/oralhealth/services/safetynetclinics.htm   [facility] ([phone])  [facility] ([phone])  [facility] ([phone])  [facility] ([phone])  [facility] ([phone]) This clinic caters to the indigent population and is on a lottery system. Location: [facility], [address], [place] Clinic Hours: Wednesdays from 6pm - 9pm, patients seen by a lottery system. For dates, call or go to [email or web address] Services: Cleanings, fillings and simple extractions. Payment Options: DENTAL WORK IS FREE OF CHARGE. Bring proof of income or support. Best way to get seen: Arrive at 5:15 pm - this is a lottery, NOT first come/first serve, so arriving earlier will not increase your chances of being seen.     [facility] [phone] Select option 1 for emergencies   Location: [facility], [address], [place] Clinic Hours: No walk-ins accepted - call the day before to schedule an appointment. Check in times are 9:30 am and 1:30 pm. Services: Simple extractions, temporary fillings, pulpectomy/pulp debridement, uncomplicated abscess drainage. Payment Options: PAYMENT IS DUE AT THE TIME OF SERVICE.  Fee is usually $100-200, additional surgical procedures (e.g. abscess drainage) may be extra. Cash, checks, Visa/MasterCard accepted.  Can file Medicaid if patient is covered for dental - patient should call case worker to check. No  discount for [facility] patients. Best way to get seen: MUST call the day before and get onto the schedule. Can usually be seen the next 1-2 days. No walk-ins accepted.     [facility] [phone]   Location: [facility], [address], [place] Clinic Hours: M, W, Th, F 8am or 1:30pm, Tues 9a or 1:30 - first come/first served. Services: Simple extractions, temporary fillings, uncomplicated abscess drainage.  You do not need to be an [place] resident. Payment Options: PAYMENT IS DUE AT THE TIME OF SERVICE. Dental insurance, otherwise sliding scale - bring proof of income or support. Depending on income and treatment needed, cost is usually $50-200. Best way to get seen: Arrive early as it is first come/first served.     [facility] [phone]   Location: [address] Clinic Hours: Mon-Thu 8a-5p Services: Most basic dental services including extractions and fillings. Payment Options: PAYMENT IS DUE AT THE TIME OF SERVICE. Sliding scale, up to 50% off - bring proof if income or support. Medicaid with dental option accepted. Best way to get seen: Call to schedule an appointment, can usually be seen within 2 weeks OR they will try to see walk-ins - show up at 8a or 2p (you may have to wait).     [facility] [phone] [place] RESIDENTS ONLY   Location: [facility], [address], [place], [place] [address] Clinic Hours: By appointment only. Monday - Thursday 8am-5pm, Friday 8am-12pm Services: Cleanings, fillings, extractions. Payment Options: PAYMENT IS DUE AT THE TIME OF SERVICE. Cash, Visa or MasterCard. Sliding scale - $30 minimum per service. Best way to get seen: Come in to office, complete packet and make an appointment -   need proof of income or support monies for each household member and proof of [place]  residence. Usually takes about a month to get in.     [facility] [phone]   Location: [address] [place] Clinic Hours: Walk-in Urgent Care Dental Services are offered Monday-Friday mornings only. The numbers of emergencies accepted daily is limited to the number of providers available. Maximum 15 - Mondays, Wednesdays & Thursdays Maximum 10 - Tuesdays & Fridays Services: You do not need to be a [place] resident to be seen for a dental emergency. Emergencies are defined as pain, swelling, abnormal bleeding, or dental trauma. Walkins will receive x-rays if needed. NOTE: Dental cleaning is not an emergency. Payment Options: PAYMENT IS DUE AT THE TIME OF SERVICE. Minimum co-pay is $40.00 for uninsured patients. Minimum co-pay is $3.00 for Medicaid with dental coverage. Dental Insurance is accepted and must be presented at time of visit. Medicare does not cover dental. Forms of payment: Cash, credit card, checks. Best way to get seen: If not previously registered with the clinic, walk-in dental registration begins at 7:15 am and is on a first come/first serve basis. If previously registered with the clinic, call to make an appointment.     [facility] [phone] [place] RESIDENTS ONLY   Location: [address], [place], [place] Clinic Hours: Mon-Thu 10a-2p Services: Extractions only! Payment Options: FREE (donations accepted) - bring proof of income or support Best way to get seen: Call and schedule an appointment OR come at 8am on the 1st Monday of every month (except for holidays) when it is first come/first served.     [facility] [phone]   Location: [address], [place] Clinic Hours: Friday mornings Services, Payment Options, Best way to get seen: Call for info  

## 2018-06-06 NOTE — ED Triage Notes (Signed)
Dental pain x 1 month. Does not have dentist

## 2018-06-06 NOTE — ED Provider Notes (Signed)
[facility] Emergency Department Provider Note ____________________________________________  Time seen: Approximately 7:15 PM  I have reviewed the triage vital signs and the nursing notes.   HISTORY  Chief Complaint Dental Pain (x 1 month)   HPI Janet Sheppard is a 33 y.o. female who presents to the emergency department for treatment and evaluation of dental pain. Pain is on the right upper jax x 1 month with worsening over the past few days. No relief with ibuprofen, tylenol, or salt water. No fever. No facial swelling. Pain worsens with air and chewing.    Past Medical History:  Diagnosis Date  . Anemia   . Bronchitis   . Hypertension     There are no active problems to display for this patient.   History reviewed. No pertinent surgical history.  Prior to Admission medications   Medication Sig Start Date End Date Taking? Authorizing Provider  azithromycin (ZITHROMAX Z-PAK) 250 MG tablet Take 2 tablets (500 mg) on  Day 1,  followed by 1 tablet (250 mg) once daily on Days 2 through 5. 01/10/16   Evon SlackGaines, Thomas C, PA-C  baclofen (LIORESAL) 10 MG tablet Take 1 tablet (10 mg total) by mouth 3 (three) times daily. 08/15/16   Ananiah Maciolek, Rulon Eisenmengerari B, FNP  clindamycin (CLEOCIN) 300 MG capsule Take 1 capsule (300 mg total) by mouth 3 (three) times daily for 10 days. 06/06/18 06/16/18  Helaine Yackel, Rulon Eisenmengerari B, FNP  doxycycline (ADOXA) 100 MG tablet Take 100 mg by mouth 2 (two) times daily.    [provider]  doxycycline (VIBRAMYCIN) 100 MG capsule Take 1 capsule (100 mg total) by mouth 2 (two) times daily. 10/16/16   Emily FilbertWilliams, Jonathan E, MD  famotidine (PEPCID) 20 MG tablet Take 1 tablet (20 mg total) by mouth 2 (two) times daily. 12/13/11 12/12/12  Elson AreasSofia, Leslie K, PA-C  hydrochlorothiazide (HYDRODIURIL) 25 MG tablet Take 25 mg by mouth daily.    [provider]  HYDROcodone-acetaminophen (NORCO/VICODIN) 5-325 MG tablet Take 1 tablet by mouth every 6 (six) hours  as needed for up to 3 days for severe pain. 06/06/18 06/09/18  Atheena Spano, Kasandra Knudsenari B, FNP  hydrocortisone (ANUSOL-HC) 2.5 % rectal cream Place 1 application rectally 2 (two) times daily. 10/16/16   Emily FilbertWilliams, Jonathan E, MD  hydrocortisone cream 1 % Apply to affected area 2 times daily 12/08/13   Elwin MochaWalden, Blair, MD  ibuprofen (ADVIL,MOTRIN) 200 MG tablet Take 400 mg by mouth every 8 (eight) hours. For pain      [provider]  Iron Combinations (IRON COMPLEX PO) Take 1 tablet by mouth daily.      [provider]  meloxicam (MOBIC) 15 MG tablet Take 1 tablet (15 mg total) by mouth daily. 11/17/15   Cuthriell, Delorise RoyalsJonathan D, PA-C  metroNIDAZOLE (FLAGYL) 500 MG tablet Take 1 tablet (500 mg total) by mouth 2 (two) times daily. 10/16/16   Emily FilbertWilliams, Jonathan E, MD  metroNIDAZOLE (FLAGYL) 500 MG tablet Take 500 mg by mouth 3 (three) times daily.    [provider]  Multiple Vitamin (MULTIVITAMIN PO) Take 1 tablet by mouth daily.      [provider]  terbinafine (LAMISIL) 1 % cream Apply 1 application topically 2 (two) times daily. 11/17/15   Cuthriell, Delorise RoyalsJonathan D, PA-C  terbinafine (LAMISIL) 250 MG tablet Take 1 tablet (250 mg total) by mouth daily. 12/02/14   Joni ReiningSmith, Ronald K, PA-C    Allergies Doxycycline; Amoxicillin-pot clavulanate; Cephalexin; Septra [bactrim]; and Tramadol  History reviewed. No pertinent family  history.  Social History Social History   Tobacco Use  . Smoking status: Former Smoker    Packs/day: 0.25    Types: Cigarettes  . Smokeless tobacco: Never Used  Substance Use Topics  . Alcohol use: Yes    Comment: occasional  . Drug use: No    Review of Systems Constitutional: Negative for fever or recent illness. ENT: Positive for dental pain. Musculoskeletal: Negative for trismus of the jaw.  Skin: Negative for wound or lesion. ____________________________________________   PHYSICAL EXAM:  VITAL SIGNS: ED Triage Vitals  Enc Vitals Group     BP  06/06/18 1907 (!) 183/110     Pulse Rate 06/06/18 1907 71     Resp 06/06/18 1907 16     Temp 06/06/18 1907 98.4 F (36.9 C)     Temp Source 06/06/18 1907 Oral     SpO2 06/06/18 1907 100 %     Weight 06/06/18 1901 (!) 360 lb (163.3 kg)     Height 06/06/18 1901 5\' 9"  (1.753 m)     Head Circumference --      Peak Flow --      Pain Score 06/06/18 1901 8     Pain Loc --      Pain Edu? --      Excl. in GC? --     Constitutional: Alert and oriented. Well appearing and in no acute distress. Eyes: Conjunctiva are clear without discharge or drainage. Mouth/Throat: Airway is patent. Periodontal Exam    Hematological/Lymphatic/Immunilogical: No palpable adenopathy.  Respiratory: Respirations even and unlabored. Musculoskeletal: Full ROM of the jaw. Neurologic: Awake, alert, oriented.  Skin: No facial erythema or edema. Psychiatric: Affect and behavior intact.  ____________________________________________   LABS (all labs ordered are listed, but only abnormal results are displayed)  Labs Reviewed - No data to display ____________________________________________   RADIOLOGY  Not indicated. ____________________________________________   PROCEDURES  Procedure(s) performed:   Procedures  Critical Care performed: No ____________________________________________   INITIAL IMPRESSION / ASSESSMENT AND PLAN / ED COURSE  Janet Sheppard is a 33 y.o. female who presents to the emergency department for treatment and evaluation of dental pain. She will be treated with clindamycin as she is allergic to other preferred antibiotics. She will be provided community resources to find a dentist and advised to do so within the next 14 days.   Also of note, she is hypertensive. She has not taken her BP medication for over a year. She denies having had any symptoms. Chart review shows that at her last recorded BP 9 months ago was also elevated, but her visit at the PCP in November was  normal. Because she is currently in pain and asymptomatic of hypertension, no treatment necessary today. She was advised to check her blood pressure when pain is controlled and to keep a log to show her PCP. She was advised to schedule an appointment for further evaluation. She was strongly advised to return to the ER for any symptom of concern.  Pertinent labs & imaging results that were available during my care of the patient were reviewed by me and considered in my medical decision making (see chart for details).  ____________________________________________   FINAL CLINICAL IMPRESSION(S) / ED DIAGNOSES  Final diagnoses:  Dental infection  Hypertension, unspecified type    Discharge Medication List as of 06/06/2018  7:43 PM      If controlled substance prescribed during this visit, 12 month history viewed on the [facility] prior to issuing an initial prescription  for Schedule II or III opiod.  Note:  This document was prepared using Dragon voice recognition software and may include unintentional dictation errors.    [name] Pesterriplett, Jaquelinne Glendening B, FNP 06/06/18 2203    Willy Eddyobinson, Patrick, MD 06/06/18 2207

## 2018-06-26 DIAGNOSIS — F418 Other specified anxiety disorders: Secondary | ICD-10-CM | POA: Insufficient documentation

## 2019-02-14 ENCOUNTER — Other Ambulatory Visit: Payer: Self-pay

## 2019-02-14 DIAGNOSIS — Z20822 Contact with and (suspected) exposure to covid-19: Secondary | ICD-10-CM

## 2019-02-15 LAB — NOVEL CORONAVIRUS, NAA: SARS-CoV-2, NAA: NOT DETECTED

## 2019-03-14 ENCOUNTER — Other Ambulatory Visit: Payer: Self-pay

## 2019-03-14 DIAGNOSIS — Z20822 Contact with and (suspected) exposure to covid-19: Secondary | ICD-10-CM

## 2019-03-15 LAB — NOVEL CORONAVIRUS, NAA: SARS-CoV-2, NAA: NOT DETECTED

## 2019-05-08 ENCOUNTER — Encounter: Payer: Self-pay | Admitting: Emergency Medicine

## 2019-05-08 ENCOUNTER — Emergency Department
Admission: EM | Admit: 2019-05-08 | Discharge: 2019-05-08 | Disposition: A | Discharge status: Home / Self Care | Payer: Self-pay | Attending: Emergency Medicine | Admitting: Emergency Medicine

## 2019-05-08 ENCOUNTER — Other Ambulatory Visit: Payer: Self-pay

## 2019-05-08 ENCOUNTER — Emergency Department: Payer: Self-pay

## 2019-05-08 DIAGNOSIS — R0789 Other chest pain: Secondary | ICD-10-CM

## 2019-05-08 DIAGNOSIS — Z79899 Other long term (current) drug therapy: Secondary | ICD-10-CM | POA: Insufficient documentation

## 2019-05-08 DIAGNOSIS — F17210 Nicotine dependence, cigarettes, uncomplicated: Secondary | ICD-10-CM | POA: Insufficient documentation

## 2019-05-08 DIAGNOSIS — R079 Chest pain, unspecified: Secondary | ICD-10-CM

## 2019-05-08 DIAGNOSIS — I10 Essential (primary) hypertension: Secondary | ICD-10-CM | POA: Insufficient documentation

## 2019-05-08 LAB — CBC WITH DIFFERENTIAL/PLATELET
Abs Immature Granulocytes: 0.01 10*3/uL (ref 0.00–0.07)
Basophils Absolute: 0.1 10*3/uL (ref 0.0–0.1)
Basophils Relative: 1 %
Eosinophils Absolute: 0.2 10*3/uL (ref 0.0–0.5)
Eosinophils Relative: 3 %
HCT: 39.7 % (ref 36.0–46.0)
Hemoglobin: 13 g/dL (ref 12.0–15.0)
Immature Granulocytes: 0 %
Lymphocytes Relative: 37 %
Lymphs Abs: 2.6 10*3/uL (ref 0.7–4.0)
MCH: 27.9 pg (ref 26.0–34.0)
MCHC: 32.7 g/dL (ref 30.0–36.0)
MCV: 85.2 fL (ref 80.0–100.0)
Monocytes Absolute: 0.4 10*3/uL (ref 0.1–1.0)
Monocytes Relative: 6 %
Neutro Abs: 3.7 10*3/uL (ref 1.7–7.7)
Neutrophils Relative %: 53 %
Platelets: 223 10*3/uL (ref 150–400)
RBC: 4.66 MIL/uL (ref 3.87–5.11)
RDW: 11.9 % (ref 11.5–15.5)
WBC: 7.1 10*3/uL (ref 4.0–10.5)
nRBC: 0 % (ref 0.0–0.2)

## 2019-05-08 LAB — COMPREHENSIVE METABOLIC PANEL
ALT: 20 U/L (ref 0–44)
AST: 15 U/L (ref 15–41)
Albumin: 4.3 g/dL (ref 3.5–5.0)
Alkaline Phosphatase: 48 U/L (ref 38–126)
Anion gap: 9 (ref 5–15)
BUN: 10 mg/dL (ref 6–20)
CO2: 26 mmol/L (ref 22–32)
Calcium: 9 mg/dL (ref 8.9–10.3)
Chloride: 105 mmol/L (ref 98–111)
Creatinine, Ser: 0.71 mg/dL (ref 0.44–1.00)
GFR calc Af Amer: >60 mL/min (ref >60)
GFR calc non Af Amer: >60 mL/min (ref >60)
Glucose, Bld: 102 mg/dL — ABNORMAL HIGH (ref 70–99)
Potassium: 3.4 mmol/L — ABNORMAL LOW (ref 3.5–5.1)
Sodium: 140 mmol/L (ref 135–145)
Total Bilirubin: 1 mg/dL (ref 0.3–1.2)
Total Protein: 6.8 g/dL (ref 6.5–8.1)

## 2019-05-08 LAB — TROPONIN I (HIGH SENSITIVITY)
Troponin I (High Sensitivity): 3 ng/L (ref <18)
Troponin I (High Sensitivity): 4 ng/L (ref <18)

## 2019-05-08 LAB — FIBRIN DERIVATIVES D-DIMER (ARMC ONLY): Fibrin derivatives D-dimer (ARMC): 299.13 ng/mL (FEU) (ref 0.00–499.00)

## 2019-05-08 MED ORDER — KETOROLAC TROMETHAMINE 30 MG/ML IJ SOLN
10.0000 mg | Freq: Once | INTRAMUSCULAR | Status: AC
  Administered 2019-05-08: 9.9 mg via INTRAVENOUS
  Filled 2019-05-08: qty 1

## 2019-05-08 MED ORDER — IBUPROFEN 800 MG PO TABS: 800.0000 mg | ORAL_TABLET | Freq: Three times a day (TID) | ORAL | 0 refills | Status: AC | PRN

## 2019-05-08 MED ORDER — HYDROCHLOROTHIAZIDE 25 MG PO TABS: 25.0000 mg | ORAL_TABLET | Freq: Every day | ORAL | 0 refills | Status: AC

## 2019-05-08 NOTE — ED Notes (Signed)
Pt on depo for birth control.

## 2019-05-08 NOTE — ED Provider Notes (Signed)
[facility] Emergency Department Provider Note   ____________________________________________   First MD Initiated Contact with Patient 05/08/19 [phone]     (approximate)  I have reviewed the triage vital signs and the nursing notes.   HISTORY  Chief Complaint Chest Pain    HPI Janet Sheppard is a 33 y.o. female who presents to the ED from home with a chief complaint of chest pain.  Patient reports a 2-day history of intermittent, sharp, nonradiating left upper chest pain.  Denies fever, cough, shortness of breath, abdominal pain, nausea, vomiting, palpitations or dizziness.  Denies recent travel or trauma.  Does take OCPs.       Past Medical History:  Diagnosis Date  . Anemia   . Bronchitis   . Hypertension     There are no problems to display for this patient.   History reviewed. No pertinent surgical history.  Prior to Admission medications   Medication Sig Start Date End Date Taking? Authorizing Provider  azithromycin (ZITHROMAX Z-PAK) 250 MG tablet Take 2 tablets (500 mg) on  Day 1,  followed by 1 tablet (250 mg) once daily on Days 2 through 5. 01/10/16   Evon SlackGaines, Thomas C, PA-C  baclofen (LIORESAL) 10 MG tablet Take 1 tablet (10 mg total) by mouth 3 (three) times daily. 08/15/16   Triplett, Rulon Eisenmengerari B, FNP  doxycycline (ADOXA) 100 MG tablet Take 100 mg by mouth 2 (two) times daily.    [provider]  doxycycline (VIBRAMYCIN) 100 MG capsule Take 1 capsule (100 mg total) by mouth 2 (two) times daily. 10/16/16   Emily FilbertWilliams, Jonathan E, MD  famotidine (PEPCID) 20 MG tablet Take 1 tablet (20 mg total) by mouth 2 (two) times daily. 12/13/11 12/12/12  Elson AreasSofia, Leslie K, PA-C  hydrochlorothiazide (HYDRODIURIL) 25 MG tablet Take 1 tablet (25 mg total) by mouth daily. 05/08/19   Irean HongSung, Joss Mcdill J, MD  hydrocortisone (ANUSOL-HC) 2.5 % rectal cream Place 1 application rectally 2 (two) times daily. 10/16/16   Emily FilbertWilliams, Jonathan E, MD  hydrocortisone cream 1 % Apply  to affected area 2 times daily 12/08/13   Elwin MochaWalden, Blair, MD  ibuprofen (ADVIL) 800 MG tablet Take 1 tablet (800 mg total) by mouth every 8 (eight) hours as needed for moderate pain. 05/08/19   Irean HongSung, Iva Posten J, MD  Iron Combinations (IRON COMPLEX PO) Take 1 tablet by mouth daily.      [provider]  meloxicam (MOBIC) 15 MG tablet Take 1 tablet (15 mg total) by mouth daily. 11/17/15   Cuthriell, Delorise RoyalsJonathan D, PA-C  metroNIDAZOLE (FLAGYL) 500 MG tablet Take 1 tablet (500 mg total) by mouth 2 (two) times daily. 10/16/16   Emily FilbertWilliams, Jonathan E, MD  metroNIDAZOLE (FLAGYL) 500 MG tablet Take 500 mg by mouth 3 (three) times daily.    [provider]  Multiple Vitamin (MULTIVITAMIN PO) Take 1 tablet by mouth daily.      [provider]  terbinafine (LAMISIL) 1 % cream Apply 1 application topically 2 (two) times daily. 11/17/15   Cuthriell, Delorise RoyalsJonathan D, PA-C  terbinafine (LAMISIL) 250 MG tablet Take 1 tablet (250 mg total) by mouth daily. 12/02/14   Joni ReiningSmith, Ronald K, PA-C    Allergies Doxycycline, Amoxicillin-pot clavulanate, Cephalexin, Septra [bactrim], and Tramadol  No family history on file.  Social History Social History   Tobacco Use  . Smoking status: Current Every Day Smoker    Packs/day: 0.25    Types: Cigarettes  . Smokeless tobacco: Never Used  Substance Use  Topics  . Alcohol use: Yes    Comment: occasional  . Drug use: No    Review of Systems  Constitutional: No fever/chills Eyes: No visual changes. ENT: No sore throat. Cardiovascular: Positive for chest pain. Respiratory: Denies shortness of breath. Gastrointestinal: No abdominal pain.  No nausea, no vomiting.  No diarrhea.  No constipation. Genitourinary: Negative for dysuria. Musculoskeletal: Negative for back pain. Skin: Negative for rash. Neurological: Negative for headaches, focal weakness or numbness.   ____________________________________________   PHYSICAL EXAM:  VITAL SIGNS: ED Triage Vitals   Enc Vitals Group     BP 05/08/19 0321 (!) 154/105     Pulse Rate 05/08/19 0321 85     Resp 05/08/19 0321 20     Temp 05/08/19 0321 98.5 F (36.9 C)     Temp Source 05/08/19 0321 Oral     SpO2 05/08/19 0321 100 %     Weight 05/08/19 0319 (!) 360 lb (163.3 kg)     Height 05/08/19 0319 5\' 9"  (1.753 m)     Head Circumference --      Peak Flow --      Pain Score 05/08/19 0319 9     Pain Loc --      Pain Edu? --      Excl. in GC? --     Constitutional: Alert and oriented. Well appearing and in no acute distress. Eyes: Conjunctivae are normal. PERRL. EOMI. Head: Atraumatic. Nose: No congestion/rhinnorhea. Mouth/Throat: Mucous membranes are moist.  Oropharynx non-erythematous. Neck: No stridor.   Cardiovascular: Normal rate, regular rhythm. Grossly normal heart sounds.  Good peripheral circulation. Respiratory: Normal respiratory effort.  No retractions. Lungs CTAB.  Left anterior chest wall tender to palpation in all movement of trunk. Gastrointestinal: Obese.  Soft and nontender to light or deep palpation. No distention. No abdominal bruits. No CVA tenderness. Musculoskeletal: No lower extremity tenderness nor edema.  No joint effusions. Neurologic:  Normal speech and language. No gross focal neurologic deficits are appreciated. No gait instability. Skin:  Skin is warm, dry and intact. No rash noted. Psychiatric: Mood and affect are normal. Speech and behavior are normal.  ____________________________________________   LABS (all labs ordered are listed, but only abnormal results are displayed)  Labs Reviewed  COMPREHENSIVE METABOLIC PANEL - Abnormal; Notable for the following components:      Result Value   Potassium 3.4 (*)    Glucose, Bld 102 (*)    All other components within normal limits  CBC WITH DIFFERENTIAL/PLATELET  FIBRIN DERIVATIVES D-DIMER ([facility] ONLY)  TROPONIN I (HIGH SENSITIVITY)  TROPONIN I (HIGH SENSITIVITY)    ____________________________________________  EKG  ED ECG REPORT I, Arish Redner J, the attending physician, personally viewed and interpreted this ECG.   Date: 05/08/2019  EKG Time: 0325  Rate: 82  Rhythm: normal EKG, normal sinus rhythm  Axis: Normal  Intervals:none  ST&T Change: Nonspecific  ____________________________________________  RADIOLOGY  ED MD interpretation: No acute cardiopulmonary process  Official radiology report(s): DG Chest 2 View  Result Date: 05/08/2019 CLINICAL DATA:  Chest pain EXAM: CHEST - 2 VIEW COMPARISON:  May 17, 2016 FINDINGS: The heart size and mediastinal contours are within normal limits. Both lungs are clear. The visualized skeletal structures are unremarkable. IMPRESSION: No active cardiopulmonary disease. Electronically Signed   By: May 19, 2016 M.D.   On: 05/08/2019 03:57    ____________________________________________   PROCEDURES  Procedure(s) performed (including Critical Care):  Procedures   ____________________________________________   INITIAL IMPRESSION / ASSESSMENT AND PLAN /  ED COURSE  As part of my medical decision making, I reviewed the following data within the [place] notes reviewed and incorporated, Labs reviewed, EKG interpreted, Old chart reviewed, Radiograph reviewed and Notes from prior ED visits     Janet Sheppard was evaluated in Emergency Department on 05/08/2019 for the symptoms described in the history of present illness. She was evaluated in the context of the global COVID-19 pandemic, which necessitated consideration that the patient might be at risk for infection with the SARS-CoV-2 virus that causes COVID-19. Institutional protocols and algorithms that pertain to the evaluation of patients at risk for COVID-19 are in a state of rapid change based on information released by regulatory bodies including the CDC and federal and state organizations. These policies and  algorithms were followed during the patient's care in the ED.    33 year old female who presents with reproducible chest pain. Differential diagnosis includes, but is not limited to, ACS, aortic dissection, pulmonary embolism, cardiac tamponade, pneumothorax, pneumonia, pericarditis, myocarditis, GI-related causes including esophagitis/gastritis, and musculoskeletal chest wall pain.    Will obtain cardiac panel, chest x-ray, D-dimer as patient is on OCPs.  IV Toradol for pain; will reassess.  Offered COVID-19 swab which patient declines.  Clinical Course as of May 07 634  Thu May 08, 2019  7341 Updated patient on negative repeat troponin and D-dimer.  Queried patient regarding blood pressure medicines and she admits she has not taken her HCTZ as directed in over a year.  Will restart this and will discharge home on NSAIDs, analgesia and patient will follow up closely with her PCP.  Strict return precautions given.  Patient verbalizes understanding agrees with plan of care.   [JS]    Clinical Course User Index [JS] Paulette Blanch, MD     ____________________________________________   FINAL CLINICAL IMPRESSION(S) / ED DIAGNOSES  Final diagnoses:  Nonspecific chest pain  Chest wall pain  Essential hypertension     ED Discharge Orders         Ordered    hydrochlorothiazide (HYDRODIURIL) 25 MG tablet  Daily     05/08/19 0634    ibuprofen (ADVIL) 800 MG tablet  Every 8 hours PRN     05/08/19 9379           Note:  This document was prepared using Dragon voice recognition software and may include unintentional dictation errors.   Paulette Blanch, MD 05/08/19 [phone]

## 2019-05-08 NOTE — Discharge Instructions (Addendum)
1.  Restart your blood pressure daily (HCTZ 25 mg). 2.  You may take Motrin as needed for pain.  You can take this in addition to the Tramadol you are currently taking. 3.  Apply moist heat to affected area several times daily. 4.  Return to the ER for worsening symptoms, persistent vomiting, difficulty breathing or other concerns.

## 2019-05-08 NOTE — ED Triage Notes (Signed)
Patient ambulatory to triage with steady gait, without difficulty or distress noted, mask in place; pt reports x 2 days having left upper CP, nonradiating with no accomp symptoms

## 2019-05-08 NOTE — ED Notes (Signed)
Pt assisted to bathroom

## 2019-06-02 DIAGNOSIS — Z720 Tobacco use: Secondary | ICD-10-CM | POA: Insufficient documentation

## 2019-06-04 ENCOUNTER — Other Ambulatory Visit: Payer: Self-pay

## 2019-06-04 ENCOUNTER — Emergency Department: Payer: Self-pay

## 2019-06-04 ENCOUNTER — Encounter: Payer: Self-pay | Admitting: Emergency Medicine

## 2019-06-04 DIAGNOSIS — Z79899 Other long term (current) drug therapy: Secondary | ICD-10-CM | POA: Insufficient documentation

## 2019-06-04 DIAGNOSIS — F17210 Nicotine dependence, cigarettes, uncomplicated: Secondary | ICD-10-CM | POA: Insufficient documentation

## 2019-06-04 DIAGNOSIS — I10 Essential (primary) hypertension: Secondary | ICD-10-CM | POA: Insufficient documentation

## 2019-06-04 DIAGNOSIS — F419 Anxiety disorder, unspecified: Secondary | ICD-10-CM | POA: Insufficient documentation

## 2019-06-04 LAB — CBC WITH DIFFERENTIAL/PLATELET
Abs Immature Granulocytes: 0 10*3/uL (ref 0.00–0.07)
Basophils Absolute: 0.1 10*3/uL (ref 0.0–0.1)
Basophils Relative: 1 %
Eosinophils Absolute: 0.2 10*3/uL (ref 0.0–0.5)
Eosinophils Relative: 3 %
HCT: 39.5 % (ref 36.0–46.0)
Hemoglobin: 13.1 g/dL (ref 12.0–15.0)
Immature Granulocytes: 0 %
Lymphocytes Relative: 37 %
Lymphs Abs: 2.5 10*3/uL (ref 0.7–4.0)
MCH: 28 pg (ref 26.0–34.0)
MCHC: 33.2 g/dL (ref 30.0–36.0)
MCV: 84.4 fL (ref 80.0–100.0)
Monocytes Absolute: 0.3 10*3/uL (ref 0.1–1.0)
Monocytes Relative: 4 %
Neutro Abs: 3.7 10*3/uL (ref 1.7–7.7)
Neutrophils Relative %: 55 %
Platelets: 236 10*3/uL (ref 150–400)
RBC: 4.68 MIL/uL (ref 3.87–5.11)
RDW: 12.4 % (ref 11.5–15.5)
WBC: 6.8 10*3/uL (ref 4.0–10.5)
nRBC: 0 % (ref 0.0–0.2)

## 2019-06-04 LAB — COMPREHENSIVE METABOLIC PANEL
ALT: 34 U/L (ref 0–44)
AST: 26 U/L (ref 15–41)
Albumin: 4.1 g/dL (ref 3.5–5.0)
Alkaline Phosphatase: 39 U/L (ref 38–126)
Anion gap: 10 (ref 5–15)
BUN: 14 mg/dL (ref 6–20)
CO2: 25 mmol/L (ref 22–32)
Calcium: 9 mg/dL (ref 8.9–10.3)
Chloride: 107 mmol/L (ref 98–111)
Creatinine, Ser: 0.64 mg/dL (ref 0.44–1.00)
GFR calc Af Amer: >60 mL/min (ref >60)
GFR calc non Af Amer: >60 mL/min (ref >60)
Glucose, Bld: 95 mg/dL (ref 70–99)
Potassium: 3.3 mmol/L — ABNORMAL LOW (ref 3.5–5.1)
Sodium: 142 mmol/L (ref 135–145)
Total Bilirubin: 0.6 mg/dL (ref 0.3–1.2)
Total Protein: 6.8 g/dL (ref 6.5–8.1)

## 2019-06-04 LAB — TROPONIN I (HIGH SENSITIVITY): Troponin I (High Sensitivity): 4 ng/L (ref <18)

## 2019-06-04 NOTE — ED Triage Notes (Addendum)
Patient ambulatory to triage with steady gait, without difficulty or distress noted, mask in place; pt c/o persistent chest "tightness"; st was seen for same on Christmas Eve with normal findings; f/u with PCP on Monday and was rx cymbalta but has not filled it yet; st she feels it may be anxiety and she has not been sleeping well at all and under increased stress

## 2019-06-05 ENCOUNTER — Emergency Department
Admission: EM | Admit: 2019-06-05 | Discharge: 2019-06-05 | Disposition: A | Discharge status: Home / Self Care | Payer: Self-pay | Attending: Emergency Medicine | Admitting: Emergency Medicine

## 2019-06-05 DIAGNOSIS — I10 Essential (primary) hypertension: Secondary | ICD-10-CM

## 2019-06-05 DIAGNOSIS — F419 Anxiety disorder, unspecified: Secondary | ICD-10-CM

## 2019-06-05 LAB — TROPONIN I (HIGH SENSITIVITY): Troponin I (High Sensitivity): 3 ng/L (ref <18)

## 2019-06-05 LAB — TSH: TSH: 2 u[IU]/mL (ref 0.350–4.500)

## 2019-06-05 MED ORDER — AMLODIPINE BESYLATE 5 MG PO TABS: 5.0000 mg | ORAL_TABLET | Freq: Every day | ORAL | 0 refills | Status: AC

## 2019-06-05 MED ORDER — AMLODIPINE BESYLATE 5 MG PO TABS
5.0000 mg | ORAL_TABLET | Freq: Once | ORAL | Status: AC
  Administered 2019-06-05: 5 mg via ORAL
  Filled 2019-06-05: qty 1

## 2019-06-05 MED ORDER — LORAZEPAM 1 MG PO TABS
1.0000 mg | ORAL_TABLET | Freq: Once | ORAL | Status: AC
  Administered 2019-06-05: 1 mg via ORAL
  Filled 2019-06-05: qty 1

## 2019-06-05 NOTE — ED Provider Notes (Signed)
[facility] Emergency Department Provider Note  ____________________________________________   First MD Initiated Contact with Patient 06/05/19 0033     (approximate)  I have reviewed the triage vital signs and the nursing notes.   HISTORY  Chief Complaint Chest Pain    HPI Janet Sheppard is a 34 y.o. female with below list of previous medical conditions including hypertension and anxiety for which patient was prescribed Cymbalta recently which she plans to fill tomorrow presents to the emergency department secondary to reproducible left side chest discomfort today.  Patient states that she has had persistent chest tightness since Christmas eve when she was seen in the emergency department for the same with a normal evaluation.  Patient was seen by primary care provider on Monday secondary to the same and prescribed Cymbalta.  Patient states that she took Cymbalta in the past but when she was no longer feeling anxious she discontinued taking it.  Regarding the patient's chest discomfort she states that when she palpates the area it is tender.  Patient denies any difficulty breathing no lower extremity pain or swelling.        Past Medical History:  Diagnosis Date   Anemia    Bronchitis    Hypertension     There are no problems to display for this patient.   History reviewed. No pertinent surgical history.  Prior to Admission medications   Medication Sig Start Date End Date Taking? Authorizing Provider  amLODipine (NORVASC) 5 MG tablet Take 1 tablet (5 mg total) by mouth daily. 06/05/19 07/05/19  Darci Current, MD  azithromycin (ZITHROMAX Z-PAK) 250 MG tablet Take 2 tablets (500 mg) on  Day 1,  followed by 1 tablet (250 mg) once daily on Days 2 through 5. 01/10/16   Evon Slack, PA-C  baclofen (LIORESAL) 10 MG tablet Take 1 tablet (10 mg total) by mouth 3 (three) times daily. 08/15/16   Triplett, Rulon Eisenmenger B, FNP  doxycycline (ADOXA) 100 MG  tablet Take 100 mg by mouth 2 (two) times daily.    [provider]  doxycycline (VIBRAMYCIN) 100 MG capsule Take 1 capsule (100 mg total) by mouth 2 (two) times daily. 10/16/16   Emily Filbert, MD  famotidine (PEPCID) 20 MG tablet Take 1 tablet (20 mg total) by mouth 2 (two) times daily. 12/13/11 12/12/12  Elson Areas, PA-C  hydrochlorothiazide (HYDRODIURIL) 25 MG tablet Take 1 tablet (25 mg total) by mouth daily. 05/08/19   Irean Hong, MD  hydrocortisone (ANUSOL-HC) 2.5 % rectal cream Place 1 application rectally 2 (two) times daily. 10/16/16   Emily Filbert, MD  hydrocortisone cream 1 % Apply to affected area 2 times daily 12/08/13   Elwin Mocha, MD  ibuprofen (ADVIL) 800 MG tablet Take 1 tablet (800 mg total) by mouth every 8 (eight) hours as needed for moderate pain. 05/08/19   Irean Hong, MD  Iron Combinations (IRON COMPLEX PO) Take 1 tablet by mouth daily.      [provider]  meloxicam (MOBIC) 15 MG tablet Take 1 tablet (15 mg total) by mouth daily. 11/17/15   Cuthriell, Delorise Royals, PA-C  metroNIDAZOLE (FLAGYL) 500 MG tablet Take 1 tablet (500 mg total) by mouth 2 (two) times daily. 10/16/16   Emily Filbert, MD  metroNIDAZOLE (FLAGYL) 500 MG tablet Take 500 mg by mouth 3 (three) times daily.    [provider]  Multiple Vitamin (MULTIVITAMIN PO) Take 1 tablet by mouth daily.  [provider]  terbinafine (LAMISIL) 1 % cream Apply 1 application topically 2 (two) times daily. 11/17/15   Cuthriell, Delorise Royals, PA-C  terbinafine (LAMISIL) 250 MG tablet Take 1 tablet (250 mg total) by mouth daily. 12/02/14   Joni Reining, PA-C    Allergies Doxycycline, Amoxicillin-pot clavulanate, Cephalexin, Septra [bactrim], and Tramadol  No family history on file.  Social History Social History   Tobacco Use   Smoking status: Current Every Day Smoker    Packs/day: 0.25    Types: Cigarettes   Smokeless tobacco: Never Used  Substance Use  Topics   Alcohol use: Yes    Comment: occasional   Drug use: No    Review of Systems Constitutional: No fever/chills Eyes: No visual changes. ENT: No sore throat. Cardiovascular: Denies chest pain. Respiratory: Denies shortness of breath. Gastrointestinal: No abdominal pain.  No nausea, no vomiting.  No diarrhea.  No constipation. Genitourinary: Negative for dysuria. Musculoskeletal: Negative for neck pain.  Negative for back pain. Integumentary: Negative for rash. Neurological: Negative for headaches, focal weakness or numbness. Psychiatric:  Positive for feelings of anxiety   ____________________________________________   PHYSICAL EXAM:  VITAL SIGNS: ED Triage Vitals  Enc Vitals Group     BP 06/04/19 2134 (!) 149/97     Pulse Rate 06/04/19 2134 74     Resp 06/04/19 2134 18     Temp 06/04/19 2134 98.9 F (37.2 C)     Temp Source 06/04/19 2134 Oral     SpO2 06/04/19 2134 100 %     Weight 06/04/19 2137 (!) 163.3 kg (360 lb 0.2 oz)     Height 06/04/19 2137 1.753 m (5\' 9" )     Head Circumference --      Peak Flow --      Pain Score 06/05/19 0006 8     Pain Loc --      Pain Edu? --      Excl. in GC? --     Constitutional: Alert and oriented.  Anxious affect Eyes: Conjunctivae are normal.  Mouth/Throat: Patient is wearing a mask. Neck: No stridor.  No meningeal signs.   Cardiovascular: Normal rate, regular rhythm. Good peripheral circulation. Grossly normal heart sounds. Respiratory: Normal respiratory effort.  No retractions. Gastrointestinal: Soft and nontender. No distention.  Musculoskeletal: No lower extremity tenderness nor edema. No gross deformities of extremities. Neurologic:  Normal speech and language. No gross focal neurologic deficits are appreciated.  Skin:  Skin is warm, dry and intact. Psychiatric: Anxious affect. Speech and behavior are normal.  ____________________________________________   LABS (all labs ordered are listed, but only abnormal  results are displayed)  Labs Reviewed  COMPREHENSIVE METABOLIC PANEL - Abnormal; Notable for the following components:      Result Value   Potassium 3.3 (*)    All other components within normal limits  CBC WITH DIFFERENTIAL/PLATELET  TSH  TROPONIN I (HIGH SENSITIVITY)  TROPONIN I (HIGH SENSITIVITY)   ____________________________________________  EKG  ED ECG REPORT I, Bergholz N Bralyn Folkert, the attending physician, personally viewed and interpreted this ECG.   Date: 06/04/2019  EKG Time: 9:14 PM  Rate: 87  Rhythm: Normal sinus rhythm  Axis: Normal  Intervals: Normal  ST&T Change: None  ____________________________________________  RADIOLOGY I,  N Suprena Travaglini, personally viewed and evaluated these images (plain radiographs) as part of my medical decision making, as well as reviewing the written report by the radiologist.  ED MD interpretation: Negative chest x-ray per radiologist.  Official radiology report(s): DG  Chest 2 View  Result Date: 06/04/2019 CLINICAL DATA:  Chest pain. EXAM: CHEST - 2 VIEW COMPARISON:  05/08/2019 FINDINGS: The cardiomediastinal contours are normal. The lungs are clear. Pulmonary vasculature is normal. No consolidation, pleural effusion, or pneumothorax. No acute osseous abnormalities are seen. Minor degenerative change in the spine. IMPRESSION: Negative radiographs of the chest. Electronically Signed   By: Keith Rake M.D.   On: 06/04/2019 22:07     Procedures   ____________________________________________   INITIAL IMPRESSION / MDM / [place] / ED COURSE  As part of my medical decision making, I reviewed the following data within the electronic MEDICAL RECORD NUMBER   34 year old female presented with above-stated history and physical exam a differential diagnosis including but not limited to CAD/PE and panic attack/anxiety disorder.  EKG revealed no evidence of ischemia or infarction laboratory data including high-sensitivity  troponin x2 - recently done D-dimer also negative.  Patient given Ativan 1 mg with improvement of anxiety symptoms.  Patient's blood pressure noted to be elevated for which she has been prescribed HCTZ.  Patient given Norvasc 5 mg p.o. with improvement of blood pressure to current pressure of 141/72.  Spoke with the patient at length regarding necessity of following up and initiating Cymbalta therapy.  ____________________________________________  FINAL CLINICAL IMPRESSION(S) / ED DIAGNOSES  Final diagnoses:  Anxiety  Hypertension, unspecified type     MEDICATIONS GIVEN DURING THIS VISIT:  Medications  LORazepam (ATIVAN) tablet 1 mg (1 mg Oral Given 06/05/19 0111)  amLODipine (NORVASC) tablet 5 mg (5 mg Oral Given 06/05/19 0159)     ED Discharge Orders         Ordered    amLODipine (NORVASC) 5 MG tablet  Daily     06/05/19 0238          *Please note:  Dietrich Ke was evaluated in Emergency Department on 06/05/2019 for the symptoms described in the history of present illness. She was evaluated in the context of the global COVID-19 pandemic, which necessitated consideration that the patient might be at risk for infection with the SARS-CoV-2 virus that causes COVID-19. Institutional protocols and algorithms that pertain to the evaluation of patients at risk for COVID-19 are in a state of rapid change based on information released by regulatory bodies including the CDC and federal and state organizations. These policies and algorithms were followed during the patient's care in the ED.  Some ED evaluations and interventions may be delayed as a result of limited staffing during the pandemic.*  Note:  This document was prepared using Dragon voice recognition software and may include unintentional dictation errors.   Gregor Hams, MD 06/05/19 [phone]

## 2019-08-08 ENCOUNTER — Other Ambulatory Visit: Payer: Self-pay

## 2019-08-08 ENCOUNTER — Emergency Department
Admission: EM | Admit: 2019-08-08 | Discharge: 2019-08-08 | Disposition: A | Discharge status: Home / Self Care | Payer: Self-pay | Attending: Emergency Medicine | Admitting: Emergency Medicine

## 2019-08-08 DIAGNOSIS — F17210 Nicotine dependence, cigarettes, uncomplicated: Secondary | ICD-10-CM | POA: Insufficient documentation

## 2019-08-08 DIAGNOSIS — I10 Essential (primary) hypertension: Secondary | ICD-10-CM | POA: Insufficient documentation

## 2019-08-08 DIAGNOSIS — R609 Edema, unspecified: Secondary | ICD-10-CM | POA: Insufficient documentation

## 2019-08-08 DIAGNOSIS — L03119 Cellulitis of unspecified part of limb: Secondary | ICD-10-CM | POA: Insufficient documentation

## 2019-08-08 DIAGNOSIS — Z79899 Other long term (current) drug therapy: Secondary | ICD-10-CM | POA: Insufficient documentation

## 2019-08-08 LAB — CBC WITH DIFFERENTIAL/PLATELET
Abs Immature Granulocytes: 0.01 10*3/uL (ref 0.00–0.07)
Basophils Absolute: 0.1 10*3/uL (ref 0.0–0.1)
Basophils Relative: 1 %
Eosinophils Absolute: 0.3 10*3/uL (ref 0.0–0.5)
Eosinophils Relative: 4 %
HCT: 41.5 % (ref 36.0–46.0)
Hemoglobin: 13.5 g/dL (ref 12.0–15.0)
Immature Granulocytes: 0 %
Lymphocytes Relative: 42 %
Lymphs Abs: 2.7 10*3/uL (ref 0.7–4.0)
MCH: 28.3 pg (ref 26.0–34.0)
MCHC: 32.5 g/dL (ref 30.0–36.0)
MCV: 87 fL (ref 80.0–100.0)
Monocytes Absolute: 0.4 10*3/uL (ref 0.1–1.0)
Monocytes Relative: 7 %
Neutro Abs: 3 10*3/uL (ref 1.7–7.7)
Neutrophils Relative %: 46 %
Platelets: 258 10*3/uL (ref 150–400)
RBC: 4.77 MIL/uL (ref 3.87–5.11)
RDW: 12.2 % (ref 11.5–15.5)
WBC: 6.5 10*3/uL (ref 4.0–10.5)
nRBC: 0 % (ref 0.0–0.2)

## 2019-08-08 LAB — BASIC METABOLIC PANEL
Anion gap: 7 (ref 5–15)
BUN: 11 mg/dL (ref 6–20)
CO2: 25 mmol/L (ref 22–32)
Calcium: 9.2 mg/dL (ref 8.9–10.3)
Chloride: 106 mmol/L (ref 98–111)
Creatinine, Ser: 0.61 mg/dL (ref 0.44–1.00)
GFR calc Af Amer: >60 mL/min (ref >60)
GFR calc non Af Amer: >60 mL/min (ref >60)
Glucose, Bld: 94 mg/dL (ref 70–99)
Potassium: 3.8 mmol/L (ref 3.5–5.1)
Sodium: 138 mmol/L (ref 135–145)

## 2019-08-08 LAB — TROPONIN I (HIGH SENSITIVITY): Troponin I (High Sensitivity): 3 ng/L (ref <18)

## 2019-08-08 MED ORDER — TRIAMCINOLONE ACETONIDE 0.5 % EX OINT: 1.0000 "application " | TOPICAL_OINTMENT | Freq: Two times a day (BID) | CUTANEOUS | 0 refills | Status: AC

## 2019-08-08 MED ORDER — CLINDAMYCIN HCL 300 MG PO CAPS: 300.0000 mg | ORAL_CAPSULE | Freq: Three times a day (TID) | ORAL | 0 refills | Status: AC

## 2019-08-08 MED ORDER — MEDICAL COMPRESSION STOCKINGS MISC: 0 refills | Status: AC

## 2019-08-08 MED ORDER — TRAZODONE HCL 50 MG PO TABS: 50.0000 mg | ORAL_TABLET | Freq: Every day | ORAL | 0 refills | Status: DC

## 2019-08-08 MED ORDER — FUROSEMIDE 20 MG PO TABS: 20.0000 mg | ORAL_TABLET | Freq: Every day | ORAL | 0 refills | Status: DC

## 2019-08-08 NOTE — Discharge Instructions (Signed)
Please take your medications as prescribed.  Please follow-up with your primary care doctor in the next several days for recheck/reevaluation.  As we discussed please call the number provided for cardiology to arrange a follow-up appointment for a possible echocardiogram of your heart (ultrasound).  I would also advise you to follow-up with dermatology if at all possible for definitive management of the skin lesions.  Return to the emergency department for any chest pain, shortness of breath, or any other symptom personally concerning to yourself.

## 2019-08-08 NOTE — ED Provider Notes (Signed)
[facility] Emergency Department Provider Note  Time seen: 10:08 PM  I have reviewed the triage vital signs and the nursing notes.   HISTORY  Chief Complaint Leg Swelling   HPI Janet Sheppard is a 34 y.o. female with a past medical history of anemia, hypertension, presents to the emergency department for leg swelling and discomfort.  According to the patient over the past 6 more months she has been experiencing lower extremity swelling.  States prior to that she has been experiencing areas of dry scaly skin to her lower extremities.  States she has been to her doctor for this but they have not been able to tell her what it is.  Does not currently have insurance so has not followed up with a dermatologist.  Patient states she has not followed up with a cardiologist either.  Patient states her lower extremity swelling has worsened somewhat over the past several months, states the scaly skin is painful at times and it keeps her awake at night.  Patient states she has significant anxiety and states the pain has been worsening her anxiety as well.  Patient denies any fever.  States she has been experiencing intermittent chest pain but this has been somewhat of a chronic issue times months, states she has been seen for in the past and has been told it is likely due to anxiety.   Past Medical History:  Diagnosis Date  . Anemia   . Bronchitis   . Hypertension     There are no problems to display for this patient.   No past surgical history on file.  Prior to Admission medications   Medication Sig Start Date End Date Taking? Authorizing Provider  amLODipine (NORVASC) 5 MG tablet Take 1 tablet (5 mg total) by mouth daily. 06/05/19 07/05/19  Darci Current, MD  azithromycin (ZITHROMAX Z-PAK) 250 MG tablet Take 2 tablets (500 mg) on  Day 1,  followed by 1 tablet (250 mg) once daily on Days 2 through 5. 01/10/16   Evon Slack, PA-C  baclofen (LIORESAL) 10 MG tablet  Take 1 tablet (10 mg total) by mouth 3 (three) times daily. 08/15/16   Triplett, Rulon Eisenmenger B, FNP  doxycycline (ADOXA) 100 MG tablet Take 100 mg by mouth 2 (two) times daily.    [provider]  doxycycline (VIBRAMYCIN) 100 MG capsule Take 1 capsule (100 mg total) by mouth 2 (two) times daily. 10/16/16   Emily Filbert, MD  famotidine (PEPCID) 20 MG tablet Take 1 tablet (20 mg total) by mouth 2 (two) times daily. 12/13/11 12/12/12  Elson Areas, PA-C  hydrochlorothiazide (HYDRODIURIL) 25 MG tablet Take 1 tablet (25 mg total) by mouth daily. 05/08/19   Irean Hong, MD  hydrocortisone (ANUSOL-HC) 2.5 % rectal cream Place 1 application rectally 2 (two) times daily. 10/16/16   Emily Filbert, MD  hydrocortisone cream 1 % Apply to affected area 2 times daily 12/08/13   Elwin Mocha, MD  ibuprofen (ADVIL) 800 MG tablet Take 1 tablet (800 mg total) by mouth every 8 (eight) hours as needed for moderate pain. 05/08/19   Irean Hong, MD  Iron Combinations (IRON COMPLEX PO) Take 1 tablet by mouth daily.      [provider]  meloxicam (MOBIC) 15 MG tablet Take 1 tablet (15 mg total) by mouth daily. 11/17/15   Cuthriell, Delorise Royals, PA-C  metroNIDAZOLE (FLAGYL) 500 MG tablet Take 1 tablet (500 mg total) by mouth 2 (two) times  daily. 10/16/16   Earleen Newport, MD  metroNIDAZOLE (FLAGYL) 500 MG tablet Take 500 mg by mouth 3 (three) times daily.    [provider]  Multiple Vitamin (MULTIVITAMIN PO) Take 1 tablet by mouth daily.      [provider]  terbinafine (LAMISIL) 1 % cream Apply 1 application topically 2 (two) times daily. 11/17/15   Cuthriell, Charline Bills, PA-C  terbinafine (LAMISIL) 250 MG tablet Take 1 tablet (250 mg total) by mouth daily. 12/02/14   Sable Feil, PA-C    Allergies  Allergen Reactions  . Doxycycline   . Amoxicillin-Pot Clavulanate Rash  . Cephalexin Rash  . Septra [Bactrim] Rash  . Tramadol Rash    No family history on file.  Social  History Social History   Tobacco Use  . Smoking status: Current Every Day Smoker    Packs/day: 0.25    Types: Cigarettes  . Smokeless tobacco: Never Used  Substance Use Topics  . Alcohol use: Yes    Comment: occasional  . Drug use: No    Review of Systems Constitutional: Negative for fever. Cardiovascular: Intermittent chest pains, none currently Respiratory: Negative for shortness of breath. Gastrointestinal: Negative for abdominal pain Musculoskeletal: Lower extremity swelling, mild Skin: Dry/scaly skin to her lower extremities, some of which is cracked open. Neurological: Negative for headache All other ROS negative  ____________________________________________   PHYSICAL EXAM:  VITAL SIGNS: ED Triage Vitals  Enc Vitals Group     BP 08/08/19 1933 (!) 157/103     Pulse Rate 08/08/19 1933 75     Resp 08/08/19 1933 18     Temp 08/08/19 1933 99.1 F (37.3 C)     Temp Source 08/08/19 1933 Oral     SpO2 08/08/19 1933 100 %     Weight 08/08/19 1934 (!) 350 lb (158.8 kg)     Height 08/08/19 1934 5\' 9"  (1.753 m)     Head Circumference --      Peak Flow --      Pain Score 08/08/19 1934 9     Pain Loc --      Pain Edu? --      Excl. in [place]? --    Constitutional: Alert and oriented. Well appearing and in no distress. Eyes: Normal exam ENT      Head: Normocephalic and atraumatic.      Mouth/Throat: Mucous membranes are moist. Cardiovascular: Normal rate, regular rhythm.  Respiratory: Normal respiratory effort without tachypnea nor retractions. Breath sounds are clear Gastrointestinal: Soft and nontender. No distention.   Musculoskeletal: Nontender with normal range of motion in all extremities.  Neurologic:  Normal speech and language. No gross focal neurologic deficits Skin:  Skin is warm, dry and intact.  Psychiatric: Mood and affect are normal.   ____________________________________________   INITIAL IMPRESSION / ASSESSMENT AND PLAN / ED COURSE  Pertinent  labs & imaging results that were available during my care of the patient were reviewed by me and considered in my medical decision making (see chart for details).   Patient presents emergency department for lower extremity swelling which has been an ongoing issue x6 or more months, as well as patchy dry scaly skin patches to her lower extremities which have been causing her some discomfort and have been intermittently cracking open.  States this has been keeping her awake at night which has been worsening in her anxiety.  States she has been experiencing intermittent chest discomfort which she relates to her worsening anxiety.  Patient has a PCP but does not have insurance and was not followed up with any specialist.  States her PCP put her on antibiotic ointment for the skin but it did not help so she no longer takes this.  Patient states she used to take hydrochlorothiazide for her blood pressure which also helped with her swelling but she has since been switched to amlodipine from her HCTZ.  Currently the patient appears well she does have mild lower extremity edema, I would say 1+ bilaterally.  She does have areas of dry scaly skin and some which have cracked open mostly to the dorsal aspect of both of her feet and somewhat on her shins.  Is not entirely clear the cause of this although there is the possibility of superinfection.  Overall it appears to be more of a chronic condition.  I did discuss with the patient the importance of following up with dermatology but she states she is trying but she is not sure if she can afford it.  Given these findings I do believe the patient would benefit from a topical steroid such as triamcinolone, I discussed the need for compression stockings and we will do a short 5-day course of furosemide for the patient.  Given the possibility of to these areas that have cracked open being superinfected we will cover with a short course of antibiotics as well.  Given the patient's  worsening anxiety and difficulty sleeping at night we will start the patient on a low-dose of trazodone and have the patient follow-up with her doctor.  I also discussed with the patient the need to follow-up with a cardiologist for an echocardiogram.  The patient is agreeable to this plan of care.  Janet Sheppard was evaluated in Emergency Department on 08/08/2019 for the symptoms described in the history of present illness. She was evaluated in the context of the global COVID-19 pandemic, which necessitated consideration that the patient might be at risk for infection with the SARS-CoV-2 virus that causes COVID-19. Institutional protocols and algorithms that pertain to the evaluation of patients at risk for COVID-19 are in a state of rapid change based on information released by regulatory bodies including the CDC and federal and state organizations. These policies and algorithms were followed during the patient's care in the ED.  ____________________________________________   FINAL CLINICAL IMPRESSION(S) / ED DIAGNOSES  Peripheral edema Cellulitis Eczema   Minna Antis, MD 08/08/19 2213

## 2019-08-08 NOTE — ED Triage Notes (Signed)
Patient with bilateral swelling to feet and legs, also circling around ankles and tops of feet patient with wound leaking fluid.  Patient reports on going for approximately 6 months.

## 2019-10-14 ENCOUNTER — Emergency Department: Payer: Self-pay

## 2019-10-14 ENCOUNTER — Encounter: Payer: Self-pay | Admitting: Intensive Care

## 2019-10-14 ENCOUNTER — Emergency Department
Admission: EM | Admit: 2019-10-14 | Discharge: 2019-10-14 | Disposition: A | Discharge status: Home / Self Care | Payer: Self-pay | Attending: Emergency Medicine | Admitting: Emergency Medicine

## 2019-10-14 ENCOUNTER — Other Ambulatory Visit: Payer: Self-pay

## 2019-10-14 DIAGNOSIS — F17210 Nicotine dependence, cigarettes, uncomplicated: Secondary | ICD-10-CM | POA: Insufficient documentation

## 2019-10-14 DIAGNOSIS — R002 Palpitations: Secondary | ICD-10-CM | POA: Insufficient documentation

## 2019-10-14 DIAGNOSIS — Z79899 Other long term (current) drug therapy: Secondary | ICD-10-CM | POA: Insufficient documentation

## 2019-10-14 DIAGNOSIS — G4700 Insomnia, unspecified: Secondary | ICD-10-CM | POA: Insufficient documentation

## 2019-10-14 DIAGNOSIS — I10 Essential (primary) hypertension: Secondary | ICD-10-CM | POA: Insufficient documentation

## 2019-10-14 LAB — CBC
HCT: 41.7 % (ref 36.0–46.0)
Hemoglobin: 14.1 g/dL (ref 12.0–15.0)
MCH: 28.2 pg (ref 26.0–34.0)
MCHC: 33.8 g/dL (ref 30.0–36.0)
MCV: 83.4 fL (ref 80.0–100.0)
Platelets: 247 10*3/uL (ref 150–400)
RBC: 5 MIL/uL (ref 3.87–5.11)
RDW: 12.4 % (ref 11.5–15.5)
WBC: 6.9 10*3/uL (ref 4.0–10.5)
nRBC: 0 % (ref 0.0–0.2)

## 2019-10-14 LAB — BASIC METABOLIC PANEL
Anion gap: 12 (ref 5–15)
BUN: 15 mg/dL (ref 6–20)
CO2: 24 mmol/L (ref 22–32)
Calcium: 9.2 mg/dL (ref 8.9–10.3)
Chloride: 103 mmol/L (ref 98–111)
Creatinine, Ser: 0.66 mg/dL (ref 0.44–1.00)
GFR calc Af Amer: >60 mL/min (ref >60)
GFR calc non Af Amer: >60 mL/min (ref >60)
Glucose, Bld: 112 mg/dL — ABNORMAL HIGH (ref 70–99)
Potassium: 3 mmol/L — ABNORMAL LOW (ref 3.5–5.1)
Sodium: 139 mmol/L (ref 135–145)

## 2019-10-14 LAB — TROPONIN I (HIGH SENSITIVITY): Troponin I (High Sensitivity): 4 ng/L (ref <18)

## 2019-10-14 MED ORDER — HYDROXYZINE HCL 25 MG PO TABS
25.0000 mg | ORAL_TABLET | Freq: Three times a day (TID) | ORAL | 0 refills | Status: DC | PRN
Start: 2019-10-14 — End: 2020-07-02

## 2019-10-14 MED ORDER — HYDROXYZINE HCL 25 MG PO TABS
50.0000 mg | ORAL_TABLET | Freq: Once | ORAL | Status: AC
  Administered 2019-10-14: 50 mg via ORAL
  Filled 2019-10-14: qty 2

## 2019-10-14 NOTE — ED Provider Notes (Signed)
[facility] Emergency Department Provider Note  Time seen: 8:02 PM  I have reviewed the triage vital signs and the nursing notes.   HISTORY  Chief Complaint Palpitations   HPI Janet Sheppard is a 34 y.o. female with a past medical history of anemia, hypertension, presents to the emergency department for palpitations.   According to the patient for the past 4 weeks she has been experiencing palpitations.  States she has noticed the most at night and when she is sitting lying still.  Denies any chest pain at any point.  Denies shortness of breath.  Patient states he has a history of anxiety and thought this could be due to anxiety but she waited several weeks and did not seem to be improving so she came to the emergency department.  Patient has a largely negative review of systems otherwise.  She does admit that she only sleeps 3 to 4 hours of sleep each night.  uses caffeine in the mornings.  Past Medical History:  Diagnosis Date  . Anemia   . Bronchitis   . Hypertension     There are no problems to display for this patient.   History reviewed. No pertinent surgical history.  Prior to Admission medications   Medication Sig Start Date End Date Taking? Authorizing Provider  amLODipine (NORVASC) 5 MG tablet Take 1 tablet (5 mg total) by mouth daily. 06/05/19 07/05/19  Darci Current, MD  azithromycin (ZITHROMAX Z-PAK) 250 MG tablet Take 2 tablets (500 mg) on  Day 1,  followed by 1 tablet (250 mg) once daily on Days 2 through 5. 01/10/16   Evon Slack, PA-C  baclofen (LIORESAL) 10 MG tablet Take 1 tablet (10 mg total) by mouth 3 (three) times daily. 08/15/16   Triplett, Rulon Eisenmenger B, FNP  doxycycline (ADOXA) 100 MG tablet Take 100 mg by mouth 2 (two) times daily.    [provider]  doxycycline (VIBRAMYCIN) 100 MG capsule Take 1 capsule (100 mg total) by mouth 2 (two) times daily. 10/16/16   Emily Filbert, MD  Elastic Bandages & Supports (MEDICAL  COMPRESSION STOCKINGS) MISC Please provide compression stockings 08/08/19   Minna Antis, MD  famotidine (PEPCID) 20 MG tablet Take 1 tablet (20 mg total) by mouth 2 (two) times daily. 12/13/11 12/12/12  Elson Areas, PA-C  furosemide (LASIX) 20 MG tablet Take 1 tablet (20 mg total) by mouth daily for 5 days. 08/08/19 08/13/19  Minna Antis, MD  hydrochlorothiazide (HYDRODIURIL) 25 MG tablet Take 1 tablet (25 mg total) by mouth daily. 05/08/19   Irean Hong, MD  hydrocortisone (ANUSOL-HC) 2.5 % rectal cream Place 1 application rectally 2 (two) times daily. 10/16/16   Emily Filbert, MD  hydrocortisone cream 1 % Apply to affected area 2 times daily 12/08/13   Elwin Mocha, MD  ibuprofen (ADVIL) 800 MG tablet Take 1 tablet (800 mg total) by mouth every 8 (eight) hours as needed for moderate pain. 05/08/19   Irean Hong, MD  Iron Combinations (IRON COMPLEX PO) Take 1 tablet by mouth daily.      [provider]  meloxicam (MOBIC) 15 MG tablet Take 1 tablet (15 mg total) by mouth daily. 11/17/15   Cuthriell, Delorise Royals, PA-C  metroNIDAZOLE (FLAGYL) 500 MG tablet Take 1 tablet (500 mg total) by mouth 2 (two) times daily. 10/16/16   Emily Filbert, MD  metroNIDAZOLE (FLAGYL) 500 MG tablet Take 500 mg by mouth 3 (three) times daily.  [provider]  Multiple Vitamin (MULTIVITAMIN PO) Take 1 tablet by mouth daily.      [provider]  terbinafine (LAMISIL) 1 % cream Apply 1 application topically 2 (two) times daily. 11/17/15   Cuthriell, Charline Bills, PA-C  terbinafine (LAMISIL) 250 MG tablet Take 1 tablet (250 mg total) by mouth daily. 12/02/14   Sable Feil, PA-C  traZODone (DESYREL) 50 MG tablet Take 1 tablet (50 mg total) by mouth at bedtime. 08/08/19   Harvest Dark, MD    Allergies  Allergen Reactions  . Doxycycline   . Amoxicillin-Pot Clavulanate Rash  . Cephalexin Rash  . Septra [Bactrim] Rash  . Tramadol Rash    History reviewed. No  pertinent family history.  Social History Social History   Tobacco Use  . Smoking status: Current Every Day Smoker    Packs/day: 0.25    Types: Cigarettes  . Smokeless tobacco: Never Used  Substance Use Topics  . Alcohol use: Yes    Alcohol/week: 3.0 standard drinks    Types: 3 Shots of liquor per week  . Drug use: No    Review of Systems Constitutional: Negative for fever Cardiovascular: Negative for chest pain.  Positive for palpitations. Respiratory: Negative for shortness of breath. Gastrointestinal: Negative for abdominal pain, vomiting and diarrhea. Musculoskeletal: Negative for musculoskeletal complaints Neurological: Negative for headache All other ROS negative  ____________________________________________   PHYSICAL EXAM:  VITAL SIGNS: ED Triage Vitals  Enc Vitals Group     BP 10/14/19 1853 (!) 159/81     Pulse Rate 10/14/19 1853 78     Resp 10/14/19 1853 16     Temp 10/14/19 1853 99.2 F (37.3 C)     Temp Source 10/14/19 1853 Oral     SpO2 10/14/19 1853 100 %     Weight 10/14/19 1854 (!) 340 lb (154.2 kg)     Height 10/14/19 1854 5\' 9"  (1.753 m)     Head Circumference --      Peak Flow --      Pain Score 10/14/19 1853 0     Pain Loc --      Pain Edu? --      Excl. in [place]? --     Constitutional: Alert and oriented. Well appearing and in no distress. Eyes: Normal exam ENT      Head: Normocephalic and atraumatic.      Mouth/Throat: Mucous membranes are moist. Cardiovascular: Normal rate, regular rhythm.  Respiratory: Normal respiratory effort without tachypnea nor retractions. Breath sounds are clear  Gastrointestinal: Soft and nontender. No distention. Musculoskeletal: Nontender with normal range of motion in all extremities.  Neurologic:  Normal speech and language. No gross focal neurologic deficits  Skin:  Skin is warm, dry and intact.  Psychiatric: Mood and affect are normal.  ____________________________________________    EKG  EKG  viewed and interpreted by myself shows a normal sinus rhythm 86 bpm with a narrow QRS, normal axis, normal intervals, no concerning ST changes.  ____________________________________________    RADIOLOGY  Chest x-ray is negative  ____________________________________________   INITIAL IMPRESSION / ASSESSMENT AND PLAN / ED COURSE  Pertinent labs & imaging results that were available during my care of the patient were reviewed by me and considered in my medical decision making (see chart for details).   Patient presents to the emergency department for palpitations ongoing for the past 4 weeks per patient.  Denies any chest pain or shortness of breath.  Overall the patient appears very well  with reassuring physical exam.  No ectopic beats auscultated on my exam.  Reassuring EKG.  Has a normal work-up including cardiac enzymes and electrolytes.  Chest x-ray is negative.  I discussed with the patient I suspect her palpitations might be worsened by her insomnia.  Patient states she has tried trazodone in the past without success.  Has never tried hydroxyzine.  I will prescribe hydroxyzine for the patient.  We will have her follow-up with her PCP.  Discussed return precautions.  Sabree Nuon was evaluated in Emergency Department on 10/14/2019 for the symptoms described in the history of present illness. She was evaluated in the context of the global COVID-19 pandemic, which necessitated consideration that the patient might be at risk for infection with the SARS-CoV-2 virus that causes COVID-19. Institutional protocols and algorithms that pertain to the evaluation of patients at risk for COVID-19 are in a state of rapid change based on information released by regulatory bodies including the CDC and federal and state organizations. These policies and algorithms were followed during the patient's care in the ED.  ____________________________________________   FINAL CLINICAL IMPRESSION(S) / ED  DIAGNOSES  Palpitations   Minna Antis, MD 10/14/19 2010

## 2019-10-14 NOTE — ED Triage Notes (Signed)
Pt c/o heart palpitations for a couple of weeks. Intermittent chest pain but none at this time.

## 2019-10-14 NOTE — ED Notes (Signed)
E-signature not working at this time. Pt verbalized understanding of D/C instructions, prescriptions and follow up care with no further questions at this time. Pt in NAD and ambulatory at time of D/C.  

## 2019-12-01 ENCOUNTER — Other Ambulatory Visit: Payer: Self-pay

## 2019-12-01 ENCOUNTER — Emergency Department
Admission: EM | Admit: 2019-12-01 | Discharge: 2019-12-01 | Disposition: A | Discharge status: Home / Self Care | Payer: Self-pay | Attending: Emergency Medicine | Admitting: Emergency Medicine

## 2019-12-01 DIAGNOSIS — I10 Essential (primary) hypertension: Secondary | ICD-10-CM | POA: Insufficient documentation

## 2019-12-01 DIAGNOSIS — K0889 Other specified disorders of teeth and supporting structures: Secondary | ICD-10-CM | POA: Insufficient documentation

## 2019-12-01 DIAGNOSIS — Z20822 Contact with and (suspected) exposure to covid-19: Secondary | ICD-10-CM | POA: Insufficient documentation

## 2019-12-01 DIAGNOSIS — Z79899 Other long term (current) drug therapy: Secondary | ICD-10-CM | POA: Insufficient documentation

## 2019-12-01 DIAGNOSIS — F17210 Nicotine dependence, cigarettes, uncomplicated: Secondary | ICD-10-CM | POA: Insufficient documentation

## 2019-12-01 LAB — SARS CORONAVIRUS 2 (TAT 6-24 HRS): SARS Coronavirus 2: NEGATIVE

## 2019-12-01 MED ORDER — CLINDAMYCIN PHOSPHATE 600 MG/4ML IJ SOLN
600.0000 mg | Freq: Once | INTRAMUSCULAR | Status: AC
  Administered 2019-12-01: 600 mg via INTRAMUSCULAR

## 2019-12-01 MED ORDER — CLINDAMYCIN HCL 300 MG PO CAPS: 300.0000 mg | ORAL_CAPSULE | Freq: Three times a day (TID) | ORAL | 0 refills | Status: DC

## 2019-12-01 MED ORDER — LIDOCAINE VISCOUS HCL 2 % MT SOLN
15.0000 mL | Freq: Once | OROMUCOSAL | Status: AC
  Administered 2019-12-01: 15 mL via OROMUCOSAL
  Filled 2019-12-01: qty 15

## 2019-12-01 MED ORDER — CLINDAMYCIN HCL 300 MG PO CAPS: 300.0000 mg | ORAL_CAPSULE | Freq: Three times a day (TID) | ORAL | 0 refills | Status: AC

## 2019-12-01 NOTE — ED Provider Notes (Signed)
[facility] Emergency Department Provider Note  ____________________________________________  Time seen: Approximately 5:18 PM  I have reviewed the triage vital signs and the nursing notes.   HISTORY  Chief Complaint Dental Pain    HPI Janet Sheppard is a 34 y.o. female that presents to the emergency department for evaluation of right sided upper dental pain for 3 days.  Patient states that a portion of her tooth fell out.  She told her job that her throat was sore this morning and could not go to work and was told to come get a Covid swab.  Patient states that she is having some pain and minor swelling to her right jaw.  She is not having any pain with swallowing or difficulty swallowing.  She does not have a dentist.  She has been taking Tylenol for pain.  She is unaware of any fevers.   Past Medical History:  Diagnosis Date  . Anemia   . Bronchitis   . Hypertension     There are no problems to display for this patient.   History reviewed. No pertinent surgical history.  Prior to Admission medications   Medication Sig Start Date End Date Taking? Authorizing Provider  amLODipine (NORVASC) 5 MG tablet Take 1 tablet (5 mg total) by mouth daily. 06/05/19 07/05/19  Darci Current, MD  azithromycin (ZITHROMAX Z-PAK) 250 MG tablet Take 2 tablets (500 mg) on  Day 1,  followed by 1 tablet (250 mg) once daily on Days 2 through 5. 01/10/16   Evon Slack, PA-C  baclofen (LIORESAL) 10 MG tablet Take 1 tablet (10 mg total) by mouth 3 (three) times daily. 08/15/16   Triplett, Rulon Eisenmenger B, FNP  clindamycin (CLEOCIN) 300 MG capsule Take 1 capsule (300 mg total) by mouth 3 (three) times daily for 10 days. 12/01/19 12/11/19  Enid Derry, PA-C  doxycycline (ADOXA) 100 MG tablet Take 100 mg by mouth 2 (two) times daily.    [provider]  Elastic Bandages & Supports (MEDICAL COMPRESSION STOCKINGS) MISC Please provide compression stockings 08/08/19    Minna Antis, MD  famotidine (PEPCID) 20 MG tablet Take 1 tablet (20 mg total) by mouth 2 (two) times daily. 12/13/11 12/12/12  Elson Areas, PA-C  furosemide (LASIX) 20 MG tablet Take 1 tablet (20 mg total) by mouth daily for 5 days. 08/08/19 08/13/19  Minna Antis, MD  hydrochlorothiazide (HYDRODIURIL) 25 MG tablet Take 1 tablet (25 mg total) by mouth daily. 05/08/19   Irean Hong, MD  hydrocortisone (ANUSOL-HC) 2.5 % rectal cream Place 1 application rectally 2 (two) times daily. 10/16/16   Emily Filbert, MD  hydrocortisone cream 1 % Apply to affected area 2 times daily 12/08/13   Elwin Mocha, MD  hydrOXYzine (ATARAX/VISTARIL) 25 MG tablet Take 1 tablet (25 mg total) by mouth 3 (three) times daily as needed for anxiety. 10/14/19   Minna Antis, MD  ibuprofen (ADVIL) 800 MG tablet Take 1 tablet (800 mg total) by mouth every 8 (eight) hours as needed for moderate pain. 05/08/19   Irean Hong, MD  Iron Combinations (IRON COMPLEX PO) Take 1 tablet by mouth daily.      [provider]  meloxicam (MOBIC) 15 MG tablet Take 1 tablet (15 mg total) by mouth daily. 11/17/15   Cuthriell, Delorise Royals, PA-C  metroNIDAZOLE (FLAGYL) 500 MG tablet Take 1 tablet (500 mg total) by mouth 2 (two) times daily. 10/16/16   Emily Filbert, MD  metroNIDAZOLE (FLAGYL)  500 MG tablet Take 500 mg by mouth 3 (three) times daily.    [provider]  Multiple Vitamin (MULTIVITAMIN PO) Take 1 tablet by mouth daily.      [provider]  terbinafine (LAMISIL) 1 % cream Apply 1 application topically 2 (two) times daily. 11/17/15   Cuthriell, Delorise Royals, PA-C  terbinafine (LAMISIL) 250 MG tablet Take 1 tablet (250 mg total) by mouth daily. 12/02/14   Joni Reining, PA-C  traZODone (DESYREL) 50 MG tablet Take 1 tablet (50 mg total) by mouth at bedtime. 08/08/19   Minna Antis, MD    Allergies Doxycycline, Amoxicillin-pot clavulanate, Cephalexin, Septra [bactrim], and  Tramadol  No family history on file.  Social History Social History   Tobacco Use  . Smoking status: Current Every Day Smoker    Packs/day: 0.25    Types: Cigarettes  . Smokeless tobacco: Never Used  Substance Use Topics  . Alcohol use: Yes    Alcohol/week: 3.0 standard drinks    Types: 3 Shots of liquor per week  . Drug use: No     Review of Systems  Constitutional: No fever/chills ENT: No upper respiratory complaints.  Positive for dental pain. Cardiovascular: No chest pain. Respiratory: No cough. No SOB. Gastrointestinal: No abdominal pain.  No nausea, no vomiting.  Musculoskeletal: Negative for musculoskeletal pain. Skin: Negative for rash, abrasions, lacerations, ecchymosis. Neurological: Negative for headaches   ____________________________________________   PHYSICAL EXAM:  VITAL SIGNS: ED Triage Vitals  Enc Vitals Group     BP 12/01/19 1654 (!) 170/94     Pulse Rate 12/01/19 1654 90     Resp 12/01/19 1654 20     Temp 12/01/19 1654 99.4 F (37.4 C)     Temp Source 12/01/19 1654 Oral     SpO2 12/01/19 1654 98 %     Weight 12/01/19 1655 (!) 343 lb (155.6 kg)     Height 12/01/19 1655 5\' 9"  (1.753 m)     Head Circumference --      Peak Flow --      Pain Score 12/01/19 1649 6     Pain Loc --      Pain Edu? --      Excl. in GC? --      Constitutional: Alert and oriented. Well appearing and in no acute distress. Eyes: Conjunctivae are normal. PERRL. EOMI. Head: Atraumatic. ENT:      Ears:      Nose: No congestion/rhinnorhea.      Mouth/Throat: Mucous membranes are moist.  Large cavity and broken tooth to right upper canine.  Mild tenderness to palpation to right cheek, proximal to right side of nostril. Mild tenderness to palpation to right jaw.  Full range of motion of right jaw without pain.  No visible swelling. Neck: No stridor.   Cardiovascular: Normal rate, regular rhythm.  Good peripheral circulation. Respiratory: Normal respiratory effort  without tachypnea or retractions. Lungs CTAB. Good air entry to the bases with no decreased or absent breath sounds. Musculoskeletal: Full range of motion to all extremities. No gross deformities appreciated. Neurologic:  Normal speech and language. No gross focal neurologic deficits are appreciated.  Skin:  Skin is warm, dry and intact. No rash noted. Psychiatric: Mood and affect are normal. Speech and behavior are normal. Patient exhibits appropriate insight and judgement.   ____________________________________________   LABS (all labs ordered are listed, but only abnormal results are displayed)  Labs Reviewed  SARS CORONAVIRUS 2 (TAT 6-24 HRS)  ____________________________________________  EKG   ____________________________________________  RADIOLOGY   No results found.  ____________________________________________    PROCEDURES  Procedure(s) performed:    Procedures    Medications  clindamycin (CLEOCIN) injection 600 mg (600 mg Intramuscular Given 12/01/19 1756)  lidocaine (XYLOCAINE) 2 % viscous mouth solution 15 mL (15 mLs Mouth/Throat Given 12/01/19 1755)     ____________________________________________   INITIAL IMPRESSION / ASSESSMENT AND PLAN / ED COURSE  Pertinent labs & imaging results that were available during my care of the patient were reviewed by me and considered in my medical decision making (see chart for details).  Review of the [place] CSRS was performed in accordance of the [facility] prior to dispensing any controlled drugs.    Patient presented to the emergency department for evaluation of dental pain for 3 days.  Vital signs and exam are reassuring.  Patient is having some tenderness to palpation along her right jaw.  No visible significant swelling.  Patient is moving her jaw normally and swallowing normally.  She is given a dose of IM Rocephin in the emergency department.  Patient will be discharged home with prescriptions for amoxicillin.  Patient is to follow up with dentist as directed.  Dental resources were given.  Patient is given ED precautions to return to the ED for any worsening or new symptoms.   Natash Berman was evaluated in Emergency Department on 12/01/2019 for the symptoms described in the history of present illness. She was evaluated in the context of the global COVID-19 pandemic, which necessitated consideration that the patient might be at risk for infection with the SARS-CoV-2 virus that causes COVID-19. Institutional protocols and algorithms that pertain to the evaluation of patients at risk for COVID-19 are in a state of rapid change based on information released by regulatory bodies including the CDC and federal and state organizations. These policies and algorithms were followed during the patient's care in the ED.  ____________________________________________  FINAL CLINICAL IMPRESSION(S) / ED DIAGNOSES  Final diagnoses:  Pain, dental      NEW MEDICATIONS STARTED DURING THIS VISIT:  ED Discharge Orders         Ordered    clindamycin (CLEOCIN) 300 MG capsule  3 times daily,   Status:  Discontinued     Reprint     12/01/19 1758    clindamycin (CLEOCIN) 300 MG capsule  3 times daily     Discontinue  Reprint     12/01/19 1819              This chart was dictated using voice recognition software/Dragon. Despite best efforts to proofread, errors can occur which can change the meaning. Any change was purely unintentional.    Enid Derry, PA-C 12/01/19 2323    Phineas Semen, MD 12/02/19 [phone]

## 2019-12-01 NOTE — ED Notes (Signed)
See triage note  Presents with dental pain   Possible abscess area  Also thinks she may have sinus infection low grade temp on arrival

## 2019-12-01 NOTE — ED Triage Notes (Signed)
Pt comes via POV from home with c/o dental pain that started 3 days ago.

## 2019-12-01 NOTE — ED Notes (Signed)
Pt stated to this tech "I was out of work today because my throat was feeling tingly. It may be a sinus infection. My boss told me that I needed a covid test before I can come back to work." Pt made aware to let provider know once she is in a rm.

## 2019-12-14 ENCOUNTER — Encounter: Payer: Self-pay | Admitting: Emergency Medicine

## 2019-12-14 ENCOUNTER — Emergency Department: Payer: Self-pay

## 2019-12-14 ENCOUNTER — Emergency Department
Admission: EM | Admit: 2019-12-14 | Discharge: 2019-12-14 | Disposition: A | Discharge status: Home / Self Care | Payer: Self-pay | Attending: Emergency Medicine | Admitting: Emergency Medicine

## 2019-12-14 ENCOUNTER — Other Ambulatory Visit: Payer: Self-pay

## 2019-12-14 DIAGNOSIS — R002 Palpitations: Secondary | ICD-10-CM | POA: Insufficient documentation

## 2019-12-14 DIAGNOSIS — F17210 Nicotine dependence, cigarettes, uncomplicated: Secondary | ICD-10-CM | POA: Insufficient documentation

## 2019-12-14 DIAGNOSIS — N390 Urinary tract infection, site not specified: Secondary | ICD-10-CM | POA: Insufficient documentation

## 2019-12-14 DIAGNOSIS — I10 Essential (primary) hypertension: Secondary | ICD-10-CM | POA: Insufficient documentation

## 2019-12-14 DIAGNOSIS — Z79899 Other long term (current) drug therapy: Secondary | ICD-10-CM | POA: Insufficient documentation

## 2019-12-14 LAB — BASIC METABOLIC PANEL
Anion gap: 9 (ref 5–15)
BUN: 11 mg/dL (ref 6–20)
CO2: 20 mmol/L — ABNORMAL LOW (ref 22–32)
Calcium: 9 mg/dL (ref 8.9–10.3)
Chloride: 110 mmol/L (ref 98–111)
Creatinine, Ser: 0.65 mg/dL (ref 0.44–1.00)
GFR calc Af Amer: >60 mL/min (ref >60)
GFR calc non Af Amer: >60 mL/min (ref >60)
Glucose, Bld: 102 mg/dL — ABNORMAL HIGH (ref 70–99)
Potassium: 3.6 mmol/L (ref 3.5–5.1)
Sodium: 139 mmol/L (ref 135–145)

## 2019-12-14 LAB — CBC
HCT: 40.8 % (ref 36.0–46.0)
Hemoglobin: 13.6 g/dL (ref 12.0–15.0)
MCH: 28.4 pg (ref 26.0–34.0)
MCHC: 33.3 g/dL (ref 30.0–36.0)
MCV: 85.2 fL (ref 80.0–100.0)
Platelets: 193 10*3/uL (ref 150–400)
RBC: 4.79 MIL/uL (ref 3.87–5.11)
RDW: 12.6 % (ref 11.5–15.5)
WBC: 3.1 10*3/uL — ABNORMAL LOW (ref 4.0–10.5)
nRBC: 0 % (ref 0.0–0.2)

## 2019-12-14 LAB — URINALYSIS, COMPLETE (UACMP) WITH MICROSCOPIC
Bilirubin Urine: NEGATIVE
Glucose, UA: NEGATIVE mg/dL
Ketones, ur: NEGATIVE mg/dL
Leukocytes,Ua: NEGATIVE
Nitrite: NEGATIVE
Protein, ur: NEGATIVE mg/dL
Specific Gravity, Urine: 1.017 (ref 1.005–1.030)
pH: 5 (ref 5.0–8.0)

## 2019-12-14 LAB — TROPONIN I (HIGH SENSITIVITY): Troponin I (High Sensitivity): 3 ng/L (ref <18)

## 2019-12-14 MED ORDER — FOSFOMYCIN TROMETHAMINE 3 G PO PACK
3.0000 g | PACK | Freq: Once | ORAL | Status: AC
  Administered 2019-12-14: 3 g via ORAL
  Filled 2019-12-14: qty 3

## 2019-12-14 MED ORDER — SODIUM CHLORIDE 0.9% FLUSH: 3.0000 mL | Freq: Once | INTRAVENOUS | Status: DC

## 2019-12-14 NOTE — Discharge Instructions (Addendum)
We gave you the fosfomycin for the UTI.  That 1 dose should take care of most mild infections.  Please follow-up with your primary care doctor and have him evaluate your breast pain.  He can also evaluate the muscle tenseness you are having in your left shoulder.  In the meantime you can use hot showers or heating pad to help with that.  Please follow-up with cardiology for the palpitations and chest pain that you are having.  I think most of the chest pain is musculoskeletal but you are having the palpitations.  Either cardiology or possibly your primary care doctor can put a Holter monitor on you and monitor your heart rate for at least 24 hours.  These return here if you need anything else or if anything worsens.

## 2019-12-14 NOTE — ED Provider Notes (Signed)
[facility] Emergency Department Provider Note   ____________________________________________   First MD Initiated Contact with Patient 12/14/19 1042     (approximate)  I have reviewed the triage vital signs and the nursing notes.   HISTORY  Chief Complaint Palpitations    HPI Janet Sheppard is a 34 y.o. female who comes in with several different complaints.  The first 1 is palpitations.  She reports these are worse at night.  They keep her up and she cannot sleep well with them.  She is very worried about them.  She has been here several times for them we have never found anything.  There are PVCs on the monitor.  These are unifocal and rare.  I explained to her that this is probably what she feels and they are basically normal.  It may get better with less caffeine.  She then complains of some pain and tightness in her rhomboid area the left side of the neck and the left shoulder.  The muscles there are tighter than the right side.  It hurts to palpate them.  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  She also has some what anxious.  Either 1 of these could make the muscles tight.  I did reassure her about that and recommend she try Motrin and heating pad.  Patient also reports some tenderness on the chest wall under the left breast and reports occasionally her breast hurts.  She does not have any discharge her irritated skin or lumps she says.  None of these complaints are new they have all been going on for weeks and months.  Have not changed.  None of the pain is made worse with exertion.  It is more noticeable at night.  Patient also complains of urinary frequency and urgency like she has a UTI.  She has been taking AZO and say that makes it better.  She knows that Azo does not treat an infection no.  The pain is all mild to moderate although it is worse at night.  There is no shortness of breath.  The pain is not pleuritic.       Past Medical History:  Diagnosis Date  . Anemia   . Bronchitis   . Hypertension     There are no problems to display for this patient.   History reviewed. No pertinent surgical history.  Prior to Admission medications   Medication Sig Start Date End Date Taking? Authorizing Provider  amLODipine (NORVASC) 5 MG tablet Take 1 tablet (5 mg total) by mouth daily. 06/05/19 07/05/19  Darci Current, MD  azithromycin (ZITHROMAX Z-PAK) 250 MG tablet Take 2 tablets (500 mg) on  Day 1,  followed by 1 tablet (250 mg) once daily on Days 2 through 5. 01/10/16   Evon Slack, PA-C  baclofen (LIORESAL) 10 MG tablet Take 1 tablet (10 mg total) by mouth 3 (three) times daily. 08/15/16   Triplett, Rulon Eisenmenger B, FNP  doxycycline (ADOXA) 100 MG tablet Take 100 mg by mouth 2 (two) times daily.    [provider]  Elastic Bandages & Supports (MEDICAL COMPRESSION STOCKINGS) MISC Please provide compression stockings 08/08/19   Minna Antis, MD  famotidine (PEPCID) 20 MG tablet Take 1 tablet (20 mg total) by mouth 2 (two) times daily. 12/13/11 12/12/12  Elson Areas, PA-C  furosemide (LASIX) 20 MG tablet Take 1 tablet (20 mg total) by mouth daily for 5 days. 08/08/19 08/13/19  Minna Antis, MD  hydrochlorothiazide (HYDRODIURIL) 25 MG tablet Take 1 tablet (25 mg total) by mouth daily. 05/08/19   Irean Hong, MD  hydrocortisone (ANUSOL-HC) 2.5 % rectal cream Place 1 application rectally 2 (two) times daily. 10/16/16   Emily Filbert, MD  hydrocortisone cream 1 % Apply to affected area 2 times daily 12/08/13   Elwin Mocha, MD  hydrOXYzine (ATARAX/VISTARIL) 25 MG tablet Take 1 tablet (25 mg total) by mouth 3 (three) times daily as needed for anxiety. 10/14/19   Minna Antis, MD  ibuprofen (ADVIL) 800 MG tablet Take 1 tablet (800 mg total) by mouth every 8 (eight) hours as needed for moderate pain. 05/08/19   Irean Hong, MD  Iron Combinations (IRON COMPLEX PO) Take 1 tablet by  mouth daily.      [provider]  meloxicam (MOBIC) 15 MG tablet Take 1 tablet (15 mg total) by mouth daily. 11/17/15   Cuthriell, Delorise Royals, PA-C  metroNIDAZOLE (FLAGYL) 500 MG tablet Take 1 tablet (500 mg total) by mouth 2 (two) times daily. 10/16/16   Emily Filbert, MD  metroNIDAZOLE (FLAGYL) 500 MG tablet Take 500 mg by mouth 3 (three) times daily.    [provider]  Multiple Vitamin (MULTIVITAMIN PO) Take 1 tablet by mouth daily.      [provider]  terbinafine (LAMISIL) 1 % cream Apply 1 application topically 2 (two) times daily. 11/17/15   Cuthriell, Delorise Royals, PA-C  terbinafine (LAMISIL) 250 MG tablet Take 1 tablet (250 mg total) by mouth daily. 12/02/14   Joni Reining, PA-C  traZODone (DESYREL) 50 MG tablet Take 1 tablet (50 mg total) by mouth at bedtime. 08/08/19   Minna Antis, MD    Allergies Doxycycline, Amoxicillin-pot clavulanate, Cephalexin, Septra [bactrim], and Tramadol  No family history on file.  Social History Social History   Tobacco Use  . Smoking status: Current Every Day Smoker    Packs/day: 0.25    Types: Cigarettes  . Smokeless tobacco: Never Used  Substance Use Topics  . Alcohol use: Yes    Alcohol/week: 3.0 standard drinks    Types: 3 Shots of liquor per week  . Drug use: No    Review of Systems  Constitutional: No fever/chills Eyes: No visual changes. ENT: No sore throat. Cardiovascular: chest pain see HPI. Respiratory: Denies shortness of breath. Gastrointestinal: No abdominal pain.  No nausea, no vomiting.  No diarrhea.  No constipation. Genitourinary: Negative for dysuria. Musculoskeletal: Negative for leg pain Skin: Negative for rash. Neurological: Negative for headaches, focal weakness   ____________________________________________   PHYSICAL EXAM:  VITAL SIGNS: ED Triage Vitals  Enc Vitals Group     BP 12/14/19 0935 (!) 154/86     Pulse Rate 12/14/19 0935 78     Resp 12/14/19 0935 18      Temp 12/14/19 0935 98.2 F (36.8 C)     Temp Source 12/14/19 0935 Oral     SpO2 12/14/19 0935 96 %     Weight 12/14/19 0935 (!) 360 lb (163.3 kg)     Height 12/14/19 0935 5\' 9"  (1.753 m)     Head Circumference --      Peak Flow --      Pain Score 12/14/19 0939 0     Pain Loc --      Pain Edu? --      Excl. in GC? --     Constitutional: Alert and oriented. Well appearing and in no acute distress.  Eyes: Conjunctivae are normal.  Head: Atraumatic. Nose: No congestion/rhinnorhea. Mouth/Throat: Mucous membranes are moist.  Oropharynx non-erythematous. Neck: No stridor.  Trapezius and posterior spinal muscles on the left side of the neck are tender and tight.   Cardiovascular: Normal rate, regular rhythm. Grossly normal heart sounds.  Good peripheral circulation. Respiratory: Normal respiratory effort.  No retractions. Lungs CTAB. Gastrointestinal: Soft and nontender. No distention. No abdominal bruits.  Musculoskeletal: No lower extremity tenderness nor edema.  There is tenderness in the rhomboids on the left side as well.  This reproduces the patient's pain.  The chest wall is tender under the breast as well.  This also reproduces the pain she has in that area. Neurologic:  Normal speech and language. No gross focal neurologic deficits are appreciated.  Skin:  Skin is warm, dry and intact. No rash noted.  ____________________________________________   LABS (all labs ordered are listed, but only abnormal results are displayed)  Labs Reviewed  BASIC METABOLIC PANEL - Abnormal; Notable for the following components:      Result Value   CO2 20 (*)    Glucose, Bld 102 (*)    All other components within normal limits  CBC - Abnormal; Notable for the following components:   WBC 3.1 (*)    All other components within normal limits  URINALYSIS, COMPLETE (UACMP) WITH MICROSCOPIC - Abnormal; Notable for the following components:   Color, Urine AMBER (*)    APPearance CLEAR (*)    Hgb  urine dipstick MODERATE (*)    Bacteria, UA RARE (*)    All other components within normal limits  POC URINE PREG, ED  TROPONIN I (HIGH SENSITIVITY)  TROPONIN I (HIGH SENSITIVITY)   ____________________________________________  EKG  EKG read interpreted by me shows normal sinus rhythm rate of 81 normal axis slightly decreased R wave progression in the chest leads ____________________________________________  RADIOLOGY  ED MD interpretation chest x-ray read by radiology reviewed by me shows no active problems  Official radiology report(s): DG Chest 2 View  Result Date: 12/14/2019 CLINICAL DATA:  Pt to ED via POV c/o for palpitations. Pt states that this has been going on for months. Pt states that she is also having pain in her chest that radiates into her jaw and her arm. Pt states that the pain comes and goes, this has been going on for a few months as well. Pt does not see cardiology but states that she has been worked up in the ER in the past for same. Pt hs tightness in her upper backSmoker, hx anxiety EXAM: CHEST - 2 VIEW COMPARISON:  10/14/2019 FINDINGS: The heart size and mediastinal contours are within normal limits. Both lungs are clear. No pleural effusion or pneumothorax. The visualized skeletal structures are unremarkable. IMPRESSION: No active cardiopulmonary disease. Electronically Signed   By: Amie Portlandavid  Ormond M.D.   On: 12/14/2019 10:10    ____________________________________________   PROCEDURES  Procedure(s) performed (including Critical Care):  Procedures   ____________________________________________   INITIAL IMPRESSION / ASSESSMENT AND PLAN / ED COURSE  Patient's UA does look like there could be a UTI there and she has symptoms.  She is allergic to multiple antibiotics.  I will give her some fosfomycin and see if this helps.  Patient with muscle tightness and palpation of which reproduces her pain.  I think this is etiology of the pain in her neck and  shoulder.  Chest wall tenderness again reproduces is reproduced that should say by palpation.  I will  have her follow-up with her primary care doctor for this and try some heating pads etc. to see if that helps.  I will have her follow-up with cardiology for her palpitations.  Possibly she can get Holter monitor.  Right now the only thing showing up is occasional isolated unifocal PVCs this is likely the cause of her palpitations.  As I explained to her oftentimes with nothing to distract you you feel more them at night.  Holter monitor will help make sure that this is the case.  Patient will return if she has any further problems.  Her troponin has been stable in the last several visits.  EKG is really unchanged for the last several visits.  Chest x-ray is good.  I do not anticipate any further problems.  I did discuss weight loss and diet and exercise with her.  She can follow-up with primary care for this.              ____________________________________________   FINAL CLINICAL IMPRESSION(S) / ED DIAGNOSES  Final diagnoses:  Palpitations  Urinary tract infection without hematuria, site unspecified     ED Discharge Orders    None       Note:  This document was prepared using Dragon voice recognition software and may include unintentional dictation errors.    Arnaldo Natal, MD 12/14/19 1146

## 2019-12-14 NOTE — ED Triage Notes (Signed)
Pt to ED via POV c/o for palpitations. Pt states that this has been going on for months. Pt states that she is also having pain in her chest that radiates into her jaw and her arm. Pt states that the pain comes and goes, this has been going on for a few months as well. Pt does not see cardiology but states that she has been worked up in the ER in the past for same. Pt hs tightness in her upper back.  Pt also states that she thinks she may have a UTI.  Pt was immediately pulled for EKG when she checked in, however, there was delay in getting EKG due to pt having oil on her skin and leads would not stick. Pt was given bath wipe to wipe oil off her skin

## 2019-12-14 NOTE — ED Notes (Signed)
First Nurse Note: Pt ambulatory into ED without difficulty or distress c/o palpitations and chest pain for "a while".

## 2019-12-14 NOTE — ED Notes (Signed)
E-signature not working at this time. Pt verbalized understanding of D/C instructions, prescriptions and follow up care with no further questions at this time. Pt in NAD and ambulatory at time of D/C.  

## 2019-12-16 NOTE — Progress Notes (Signed)
New Outpatient Visit Date: 12/17/2019  Primary Care Provider: [facility] [address] [place],  [place] [address]  Chief Complaint: Palpitations  HPI:  Janet Sheppard is a 34 y.o. female who is being seen today for the evaluation of palpitations following recent emergency department visit.  She has actually been seen in the [facility] emergency department multiple times over the last year for various complaints.  Most recently, she presented on 01/02/2020 complaining of palpitations, left back, neck, and shoulder pain, and left breast discomfort. She has a history of hypertension, anemia, and bronchitis.  Per the ED provider note, PVCs were noted on telemetry, no significant abnormality was identified.  Janet Sheppard reports that she has experienced palpitations most nights for the last several months.  She describes them as a skipped beat or transient pause in her heartbeat.  She is scared that she may die when she goes to sleep.  She also notes pain in the chest.  The pain varies in location from the substernal region to below the left breast and the left breast itself.  She is scheduled to see her PCP later this month for further breast examination.  Janet Sheppard states that the chest pain is "very faint" and "catches you off guard."  She is unable to characterize it further.  It usually lasts a few minutes at a time and is random.  The pain is not exertional and has been present for several years.  She has tried an OTC antacid with modest relief.  Janet Sheppard denies shortness of breath, lightheadedness. She endorses intermittent leg swelling and has a history of skin discoloration overlying her shins.  She has been told that she has vein problems in her legs.  She currently consumes [facility] on a regular basis but has cut out energy drink use and is trying to cut down on her caffeine  consumption.  --------------------------------------------------------------------------------------------------  Cardiovascular History & Procedures: Cardiovascular Problems:  Palpitations  Chest pain  Risk Factors:  Hypertension, tobacco use, and morbid obesity  Cath/PCI:  None  CV Surgery:  None  EP Procedures and Devices:  None  Non-Invasive Evaluation(s):  None  Recent CV Pertinent Labs: Lab Results  Component Value Date   INR 1.01 12/28/2016   K 3.6 12/14/2019   K 4.0 12/25/2013   BUN 11 12/14/2019   BUN 15 12/25/2013   CREATININE 0.65 12/14/2019   CREATININE 0.77 12/25/2013    --------------------------------------------------------------------------------------------------  Past Medical History:  Diagnosis Date  . Anemia   . Bronchitis   . Hypertension     History reviewed. No pertinent surgical history.  Current Meds  Medication Sig  . amLODipine (NORVASC) 5 MG tablet Take 1 tablet (5 mg total) by mouth daily.  . DULoxetine (CYMBALTA) 30 MG capsule Take by mouth.  [employer] Bandages & Supports (MEDICAL COMPRESSION STOCKINGS) MISC Please provide compression stockings  . hydrochlorothiazide (HYDRODIURIL) 25 MG tablet Take 1 tablet (25 mg total) by mouth daily.  . hydrOXYzine (ATARAX/VISTARIL) 25 MG tablet Take 1 tablet (25 mg total) by mouth 3 (three) times daily as needed for anxiety.  Marland Kitchen ibuprofen (ADVIL) 800 MG tablet Take 1 tablet (800 mg total) by mouth every 8 (eight) hours as needed for moderate pain.  . medroxyPROGESTERone (DEPO-PROVERA) 150 MG/ML injection Inject into the muscle.  . terbinafine (LAMISIL) 1 % cream Apply 1 application topically 2 (two) times daily.    Allergies: Doxycycline, Amoxicillin-pot clavulanate, Cephalexin, Septra [bactrim], and Sulfamethoxazole-trimethoprim  Social History  Tobacco Use  . Smoking status: Current Every Day Smoker    Packs/day: 0.25    Years: 11.00    Pack years: 2.75    Types:  Cigarettes  . Smokeless tobacco: Never Used  Substance Use Topics  . Alcohol use: Yes    Alcohol/week: 3.0 standard drinks    Types: 3 Shots of liquor per week  . Drug use: Not Currently    Types: Marijuana    Family History  Problem Relation Age of Onset  . Arthritis Mother   . Heart Problems Maternal Grandmother        Open heart surgery  . Diabetes Maternal Grandmother   . COPD Maternal Grandfather   . Breast cancer Paternal Grandmother     Review of Systems: A 12-system review of systems was performed and was negative except as noted in the HPI.  --------------------------------------------------------------------------------------------------  Physical Exam: BP (!) 128/98 (BP Location: Left Arm, Patient Position: Sitting, Cuff Size: Large)   Pulse 64   Ht 5\' 9"  (1.753 m)   Wt (!) 333 lb 6.4 oz (151.2 kg)   SpO2 98%   BMI 49.23 kg/m   General:  NAD. HEENT: No conjunctival pallor or scleral icterus. Facemask in place. Neck: Supple with possible thyromegaly.  No obvious JVD or HJR, though body habitus limits evaluation.  No carotid bruit. Lungs: Normal work of breathing. Clear to auscultation bilaterally without wheezes or crackles. Heart: Regular rate and rhythm without murmurs, rubs, or gallops.  Unable to assess PMI due to body habitus. Abd: Bowel sounds present. Soft, NT/ND.  Unable to assess HSM due to body habitus. Ext: Trace pretibial edema bilaterally. Radial, PT, and DP pulses are 2+ bilaterally Skin: Warm and dry.  Chronic skin thickening and hyperpigmentation with scaling noted overlying the shins. Neuro: CNIII-XII intact. Strength and fine-touch sensation intact in upper and lower extremities bilaterally. Psych: Normal mood and affect.  EKG:  Normal sinus rhythm without abnormality.  Lab Results  Component Value Date   WBC 3.1 (L) 12/14/2019   HGB 13.6 12/14/2019   HCT 40.8 12/14/2019   MCV 85.2 12/14/2019   PLT 193 12/14/2019    Lab Results   Component Value Date   NA 139 12/14/2019   K 3.6 12/14/2019   CL 110 12/14/2019   CO2 20 (L) 12/14/2019   BUN 11 12/14/2019   CREATININE 0.65 12/14/2019   GLUCOSE 102 (H) 12/14/2019   ALT 34 06/04/2019   Lab Results  Component Value Date   TSH 2.000 06/05/2019   --------------------------------------------------------------------------------------------------  ASSESSMENT AND PLAN: Palpitations: Symptoms are most likely due to palpitations noted during telemetry monitoring during prior ED visits.  EKG and exam are unremarkable today.  TSH in January was normal.  I have encouraged Ms. Gressman to minimize her caffeine consumption.  We have agreed to obtain a 14-day event monitor to better characterize her PVC burden and also evaluate for other arrhythmias that may underlie her palpitations.  Chest pain: Pain is not consistent with angina and more likely is due to a musculoskeletal or GI etiology.  However, given previously reported PVC's and chronic LE edema, I have recommended that we obtain an echocardiogram to exclude significant structural abnormality.  I have also recommended use of famotidine 20 mg BID, as well as PCP follow-up for further evaluation of left breast pain.  Hypertension: Sodium restriction and weight loss encouraged.  Continue current dose of amlodipine.  Further management per PCP.  Tobacco use: Smoking cessation encouraged.  Morbid obesity:  BMI > 40.  Weight loss encouraged through diet and exercise.  Follow-up: Return to clinic after completion of echocardiogram and event monitor.  Yvonne Kendall, MD 12/18/2019 12:43 PM

## 2019-12-17 ENCOUNTER — Other Ambulatory Visit: Payer: Self-pay

## 2019-12-17 ENCOUNTER — Ambulatory Visit (INDEPENDENT_AMBULATORY_CARE_PROVIDER_SITE_OTHER): Payer: Self-pay | Admitting: Internal Medicine

## 2019-12-17 ENCOUNTER — Encounter: Payer: Self-pay | Admitting: Internal Medicine

## 2019-12-17 ENCOUNTER — Ambulatory Visit (INDEPENDENT_AMBULATORY_CARE_PROVIDER_SITE_OTHER): Payer: Self-pay

## 2019-12-17 VITALS — BP 128/98 | HR 64 | Ht 69.0 in | Wt 333.4 lb

## 2019-12-17 DIAGNOSIS — R002 Palpitations: Secondary | ICD-10-CM

## 2019-12-17 DIAGNOSIS — I10 Essential (primary) hypertension: Secondary | ICD-10-CM

## 2019-12-17 DIAGNOSIS — Z720 Tobacco use: Secondary | ICD-10-CM

## 2019-12-17 DIAGNOSIS — R079 Chest pain, unspecified: Secondary | ICD-10-CM

## 2019-12-17 MED ORDER — FAMOTIDINE 20 MG PO TABS
20.0000 mg | ORAL_TABLET | Freq: Two times a day (BID) | ORAL | Status: DC
Start: 2019-12-17 — End: 2020-01-15

## 2019-12-17 NOTE — Patient Instructions (Addendum)
Medication Instructions:  Your physician has recommended you make the following change in your medication:  1- TAKE Famotidine 20 mg (1 tablet) by mouth two times a day - You may get this medication over the counter.   *If you need a refill on your cardiac medications before your next appointment, please call your pharmacy*   Lab Work: none If you have labs (blood work) drawn today and your tests are completely normal, you will receive your results only by: Marland Kitchen MyChart Message (if you have MyChart) OR . A paper copy in the mail If you have any lab test that is abnormal or we need to change your treatment, we will call you to review the results.   Testing/Procedures: 1- ECHOCARDIOGRAM Your physician has requested that you have an echocardiogram. Echocardiography is a painless test that uses sound waves to create images of your heart. It provides your doctor with information about the size and shape of your heart and how well your heart's chambers and valves are working. This procedure takes approximately one hour. There are no restrictions for this procedure. You may get an IV, if needed, to receive an ultrasound enhancing agent through to better visualize your heart.    2- ZIO XT MONITOR FOR 14 DAYS Your physician has recommended that you wear a Zio monitor. This monitor is a medical device that records the heart's electrical activity. Doctors most often use these monitors to diagnose arrhythmias. Arrhythmias are problems with the speed or rhythm of the heartbeat. The monitor is a small device applied to your chest. You can wear one while you do your normal daily activities. While wearing this monitor if you have any symptoms to push the button and record what you felt. Once you have worn this monitor for the period of time provider prescribed (Usually 14 days), you will return the monitor device in the postage paid box. Once it is returned they will download the data collected and provide [place]  with a report which the provider will then review and we will call you with those results. Important tips:  1. Avoid showering during the first 24 hours of wearing the monitor. 2. Avoid excessive sweating to help maximize wear time. 3. Do not submerge the device, no hot tubs, and no swimming pools. 4. Keep any lotions or oils away from the patch. 5. After 24 hours you may shower with the patch on. Take brief showers with your back facing the shower head.  6. Do not remove patch once it has been placed because that will interrupt data and decrease adhesive wear time. 7. Push the button when you have any symptoms and write down what you were feeling. 8. Once you have completed wearing your monitor, remove and place into box which has postage paid and place in your outgoing mailbox.  9. If for some reason you have misplaced your box then call our office and we can provide another box and/or mail it off for you.  Follow-Up: At [facility], you and your health needs are our priority.  As part of our continuing mission to provide you with exceptional heart care, we have created designated Provider Care Teams.  These Care Teams include your primary Cardiologist (physician) and Advanced Practice Providers (APPs -  Physician Assistants and Nurse Practitioners) who all work together to provide you with the care you need, when you need it.  We recommend signing up for the patient portal called "MyChart".  Sign up information is provided on  this After Visit Summary.  MyChart is used to connect with patients for Virtual Visits (Telemedicine).  Patients are able to view lab/test results, encounter notes, upcoming appointments, etc.  Non-urgent messages can be sent to your provider as well.   To learn more about what you can do with MyChart, go to [email or web address].    Your next appointment:   After testing is completed.   The format for your next appointment:   In Person  Provider:    You may  see DR Cristal Deer END or one of the following Advanced Practice Providers on your designated Care Team:    Nicolasa Ducking, NP  Eula Listen, PA-C  Marisue Ivan, PA-C    Echocardiogram An echocardiogram is a procedure that uses painless sound waves (ultrasound) to produce an image of the heart. Images from an echocardiogram can provide important information about:  Signs of coronary artery disease (CAD).  Aneurysm detection. An aneurysm is a weak or damaged part of an artery wall that bulges out from the normal force of blood pumping through the body.  Heart size and shape. Changes in the size or shape of the heart can be associated with certain conditions, including heart failure, aneurysm, and CAD.  Heart muscle function.  Heart valve function.  Signs of a past heart attack.  Fluid buildup around the heart.  Thickening of the heart muscle.  A tumor or infectious growth around the heart valves. Tell a health care provider about:  Any allergies you have.  All medicines you are taking, including vitamins, herbs, eye drops, creams, and over-the-counter medicines.  Any blood disorders you have.  Any surgeries you have had.  Any medical conditions you have.  Whether you are pregnant or may be pregnant. What are the risks? Generally, this is a safe procedure. However, problems may occur, including:  Allergic reaction to dye (contrast) that may be used during the procedure. What happens before the procedure? No specific preparation is needed. You may eat and drink normally. What happens during the procedure?   An IV tube may be inserted into one of your veins.  You may receive contrast through this tube. A contrast is an injection that improves the quality of the pictures from your heart.  A gel will be applied to your chest.  A wand-like tool (transducer) will be moved over your chest. The gel will help to transmit the sound waves from the transducer.  The  sound waves will harmlessly bounce off of your heart to allow the heart images to be captured in real-time motion. The images will be recorded on a computer. The procedure may vary among health care providers and hospitals. What happens after the procedure?  You may return to your normal, everyday life, including diet, activities, and medicines, unless your health care provider tells you not to do that. Summary  An echocardiogram is a procedure that uses painless sound waves (ultrasound) to produce an image of the heart.  Images from an echocardiogram can provide important information about the size and shape of your heart, heart muscle function, heart valve function, and fluid buildup around your heart.  You do not need to do anything to prepare before this procedure. You may eat and drink normally.  After the echocardiogram is completed, you may return to your normal, everyday life, unless your health care provider tells you not to do that. This information is not intended to replace advice given to you by your health care provider. Make sure you  discuss any questions you have with your health care provider. Document Revised: 08/22/2018 Document Reviewed: 06/03/2016 Elsevier Patient Education  2020 [employer].

## 2019-12-18 ENCOUNTER — Encounter: Payer: Self-pay | Admitting: Internal Medicine

## 2019-12-18 DIAGNOSIS — R079 Chest pain, unspecified: Secondary | ICD-10-CM | POA: Insufficient documentation

## 2019-12-18 DIAGNOSIS — R002 Palpitations: Secondary | ICD-10-CM | POA: Insufficient documentation

## 2020-01-08 ENCOUNTER — Other Ambulatory Visit: Payer: Self-pay

## 2020-01-08 ENCOUNTER — Ambulatory Visit (INDEPENDENT_AMBULATORY_CARE_PROVIDER_SITE_OTHER): Payer: Self-pay

## 2020-01-08 DIAGNOSIS — R079 Chest pain, unspecified: Secondary | ICD-10-CM

## 2020-01-08 LAB — ECHOCARDIOGRAM COMPLETE
AR max vel: 2.82 cm2
AV Area VTI: 2.72 cm2
AV Area mean vel: 2.65 cm2
AV Mean grad: 5 mmHg
AV Peak grad: 8.9 mmHg
Ao pk vel: 1.49 m/s
Area-P 1/2: 4.06 cm2
Calc EF: 52.3 %
S' Lateral: 3.5 cm
Single Plane A2C EF: 51.7 %
Single Plane A4C EF: 54 %

## 2020-01-15 ENCOUNTER — Other Ambulatory Visit: Payer: Self-pay

## 2020-01-15 ENCOUNTER — Encounter: Payer: Self-pay | Admitting: Nurse Practitioner

## 2020-01-15 ENCOUNTER — Ambulatory Visit (INDEPENDENT_AMBULATORY_CARE_PROVIDER_SITE_OTHER): Payer: Self-pay | Admitting: Nurse Practitioner

## 2020-01-15 VITALS — BP 120/90 | HR 76 | Ht 69.0 in | Wt 331.0 lb

## 2020-01-15 DIAGNOSIS — I10 Essential (primary) hypertension: Secondary | ICD-10-CM

## 2020-01-15 DIAGNOSIS — F419 Anxiety disorder, unspecified: Secondary | ICD-10-CM

## 2020-01-15 DIAGNOSIS — I493 Ventricular premature depolarization: Secondary | ICD-10-CM

## 2020-01-15 DIAGNOSIS — R0683 Snoring: Secondary | ICD-10-CM

## 2020-01-15 NOTE — Progress Notes (Signed)
Office Visit    Patient Name: Janet Sheppard Date of Encounter: 01/15/2020  Primary Care Provider:  [facility] Cardiologist:  Yvonne Kendall, MD  Chief Complaint    34 year old female with a history of hypertension, anemia, and obesity, who presents for follow-up related to palpitations.  Past Medical History    Past Medical History:  Diagnosis Date  . Anemia   . Bronchitis   . History of echocardiogram    a. 12/2019 Echo: EF 60-65%, no rwma. Mildly enlarged RV. Nl valves.  . Hypertension   . Palpitations    a. 12/2019 Zio: RSR, 92 (54-170). Occas PVCs (1.6%), Rare PACs/vent bigeminy/trigeminy (<1%). Triggered events assoc w/ PVCs and also RSR. Bradycardia and vent bigeminy noted during hours of sleep.   History reviewed. No pertinent surgical history.  Allergies  Allergies  Allergen Reactions  . Doxycycline   . Amoxicillin-Pot Clavulanate Rash  . Cephalexin Rash  . Septra [Bactrim] Rash  . Sulfamethoxazole-Trimethoprim Rash    History of Present Illness    34 year old female with a history of hypertension, anemia, and obesity.  She was recently evaluated in the emergency department in the setting of palpitations, left back, neck, and shoulder pain, as well as left breast discomfort.  PVCs were noted on telemetry but work-up was otherwise unremarkable.  She followed up with Dr. Okey Dupre on August 4, which time she also reported atypical chest pain felt to be most likely musculoskeletal or GI in etiology.  Pepcid therapy was recommended.  Echocardiogram was carried out showed normal LV function without regional wall motion or significant valvular abnormalities.  A Zio monitor was placed showing predominantly sinus rhythm with an average heart rate of 92 bpm (54-170), occasional PVCs (1.6%), and rare PACs, ventricular bigeminy, and ventricular trigeminy (less than 1%.  Trigger events were associated PVCs and also sinus rhythm.  Bradycardia and ventricular  bigeminy were also noted during hours of sleep.  Since her last visit, she has continued to note occasional palpitations.  She has not had any chest pain.  She does note chronic fatigue and daytime somnolence.  She believes she snores at night and says her sleep is disrupted by feelings of fear related to dying in her sleep.  She does have a history of anxiety and recognizes that she needs to be more compliant with her Cymbalta, which she is only taking on occasion.  She denies dyspnea, PND, orthopnea, dizziness, syncope, or early satiety.  She occasionally notes lower extremity swelling.  Home Medications    Prior to Admission medications   Medication Sig Start Date End Date Taking? Authorizing Provider  amLODipine (NORVASC) 5 MG tablet Take 1 tablet (5 mg total) by mouth daily. 06/05/19 12/17/19  Darci Current, MD  DULoxetine (CYMBALTA) 30 MG capsule Take by mouth. 06/02/19 06/01/20  [provider]  Elastic Bandages & Supports (MEDICAL COMPRESSION STOCKINGS) MISC Please provide compression stockings 08/08/19   Minna Antis, MD  famotidine (PEPCID) 20 MG tablet Take 1 tablet (20 mg total) by mouth 2 (two) times daily. 12/17/19   End, Cristal Deer, MD  hydrochlorothiazide (HYDRODIURIL) 25 MG tablet Take 1 tablet (25 mg total) by mouth daily. 05/08/19   Irean Hong, MD  hydrOXYzine (ATARAX/VISTARIL) 25 MG tablet Take 1 tablet (25 mg total) by mouth 3 (three) times daily as needed for anxiety. 10/14/19   Minna Antis, MD  ibuprofen (ADVIL) 800 MG tablet Take 1 tablet (800 mg total) by mouth every 8 (eight) hours  as needed for moderate pain. 05/08/19   Irean Hong, MD  medroxyPROGESTERone (DEPO-PROVERA) 150 MG/ML injection Inject into the muscle. 02/25/19   [provider]  terbinafine (LAMISIL) 1 % cream Apply 1 application topically 2 (two) times daily. 11/17/15   Cuthriell, Delorise Royals, PA-C  traMADol (ULTRAM) 50 MG tablet Take 50 mg by mouth every 6 (six) hours as  needed. Patient not taking: Reported on 12/17/2019 09/01/19   [provider]    Review of Systems    Continues to experience anxiety related to palpitations and a fear of dying in her sleep.  She describes somewhat chronic fatigue and daytime somnolence with a history of snoring.  Ongoing occasional palpitations and mild lower extremity swelling.  She denies dyspnea, PND, orthopnea, dizziness, syncope, or early satiety.  All other systems reviewed and are otherwise negative except as noted above.  Physical Exam    VS:  BP 120/90 (BP Location: Left Arm, Patient Position: Sitting, Cuff Size: Large)   Pulse 76   Ht 5\' 9"  (1.753 m)   Wt (!) 331 lb (150.1 kg)   SpO2 98%   BMI 48.88 kg/m  , BMI Body mass index is 48.88 kg/m. GEN: Obese, in no acute distress. HEENT: normal. Neck: Supple, obese, difficult to gauge JVP.  No carotid bruits, or masses. Cardiac: RRR, no murmurs, rubs, or gallops. No clubbing, cyanosis, edema.  Radials/PT 2+ and equal bilaterally.  Respiratory:  Respirations regular and unlabored, clear to auscultation bilaterally. GI: Soft, nontender, nondistended, BS + x 4. MS: no deformity or atrophy. Skin: warm and dry, no rash. Neuro:  Strength and sensation are intact. Psych: Normal affect.  Accessory Clinical Findings    Refused ECG today  Lab Results  Component Value Date   WBC 3.1 (L) 12/14/2019   HGB 13.6 12/14/2019   HCT 40.8 12/14/2019   MCV 85.2 12/14/2019   PLT 193 12/14/2019   Lab Results  Component Value Date   CREATININE 0.65 12/14/2019   BUN 11 12/14/2019   NA 139 12/14/2019   K 3.6 12/14/2019   CL 110 12/14/2019   CO2 20 (L) 12/14/2019   Lab Results  Component Value Date   ALT 34 06/04/2019   AST 26 06/04/2019   ALKPHOS 39 06/04/2019   BILITOT 0.6 06/04/2019    Assessment & Plan    1.  Symptomatic PVCs: Patient with a history of palpitations status post a Zio monitoring.  This did reveal occasional PVCs up to 1.6% of the time  with several triggered events related to isolated PVCs.  Other triggered events were related to sinus rhythm.  Her perception of PVC occurrence is greater than what was identified on monitoring.  We discussed the benign nature of occasional PVCs and I offered reassurance, especially in light of normal LV function on echocardiography and normal lab work earlier this month.  We discussed that if burden of PVCs increases, we could always consider addition of beta-blocker therapy.  We have mutually agreed that in light of rare symptoms/PVCs and chronic fatigue, that we would hold off on beta-blocker therapy at this time.  2.  Atypical chest pain: She has not had any chest pain since her last visit.  She notes that episodes occur randomly and have been occurring over several years.  Last episode occurred in August, when she was evaluated in the emergency department with normal ECG and troponin at that time.  Historically, pain does change with upper body movement/position changes.  Likely musculoskeletal.  Reassurance offered.  3.  Essential hypertension: Pressure is 120/90 today.  She is on amlodipine and HCTZ therapy.  We discussed the importance of lifestyle modifications including regular exercise and weight loss.  4.  Snores: Patient reports a history of snoring and I noted on her Zio monitor that she did experience bradycardia with bigeminy occurring during early morning hours, potentially suggestive of sleep apnea.  She is willing to pursue sleep apnea testing and I will refer to pulmonology.  5.  Morbid obesity: We did discuss the importance of regular exercise and restriction of caloric intake with a goal of weight loss.  6.  Anxiety: Patient reports significant anxiety and fear of dying in her sleep.  She lost an uncle suddenly last year and this is significantly impacted her.  She is prescribed Cymbalta but says she only takes it occasionally.  We discussed that compliance with anxiety medicine is  critical in ensuring a steady state and adequate treatment.  7.  Disposition: Follow-up in 6 months or sooner if necessary.   Nicolasa Ducking, NP 01/15/2020, 10:09 AM

## 2020-01-15 NOTE — Patient Instructions (Signed)
Medication Instructions:  Your physician recommends that you continue on your current medications as directed. Please refer to the Current Medication list given to you today.  *If you need a refill on your cardiac medications before your next appointment, please call your pharmacy*   Lab Work: None ordered If you have labs (blood work) drawn today and your tests are completely normal, you will receive your results only by: Marland Kitchen MyChart Message (if you have MyChart) OR . A paper copy in the mail If you have any lab test that is abnormal or we need to change your treatment, we will call you to review the results.   Testing/Procedures: None ordered   Follow-Up: At [facility], you and your health needs are our priority.  As part of our continuing mission to provide you with exceptional heart care, we have created designated Provider Care Teams.  These Care Teams include your primary Cardiologist (physician) and Advanced Practice Providers (APPs -  Physician Assistants and Nurse Practitioners) who all work together to provide you with the care you need, when you need it.  We recommend signing up for the patient portal called "MyChart".  Sign up information is provided on this After Visit Summary.  MyChart is used to connect with patients for Virtual Visits (Telemedicine).  Patients are able to view lab/test results, encounter notes, upcoming appointments, etc.  Non-urgent messages can be sent to your provider as well.   To learn more about what you can do with MyChart, go to [email or web address].    Your next appointment:   6 month(s)  The format for your next appointment:   In Person  Provider:    You may see Yvonne Kendall, MD or one of the following Advanced Practice Providers on your designated Care Team:    Nicolasa Ducking, NP  Eula Listen, PA-C  Marisue Ivan, PA-C    Other Instructions Referral to pulmonology for sleep study

## 2020-02-04 ENCOUNTER — Emergency Department
Admission: EM | Admit: 2020-02-04 | Discharge: 2020-02-04 | Disposition: A | Discharge status: Home / Self Care | Payer: Self-pay | Attending: Emergency Medicine | Admitting: Emergency Medicine

## 2020-02-04 DIAGNOSIS — R202 Paresthesia of skin: Secondary | ICD-10-CM | POA: Insufficient documentation

## 2020-02-04 DIAGNOSIS — M545 Low back pain: Secondary | ICD-10-CM | POA: Insufficient documentation

## 2020-02-04 DIAGNOSIS — R079 Chest pain, unspecified: Secondary | ICD-10-CM | POA: Insufficient documentation

## 2020-02-04 DIAGNOSIS — Z5321 Procedure and treatment not carried out due to patient leaving prior to being seen by health care provider: Secondary | ICD-10-CM | POA: Insufficient documentation

## 2020-02-04 NOTE — ED Triage Notes (Signed)
Pt in with co left lower back pain that states for a month, also co left sided numbness. States since Monday has had numbness to entire left side of body. Pt also having chest pain describes it as pressure, hx of the same and was told it was anxiety.

## 2020-02-07 ENCOUNTER — Other Ambulatory Visit: Payer: Self-pay

## 2020-02-07 ENCOUNTER — Emergency Department
Admission: EM | Admit: 2020-02-07 | Discharge: 2020-02-07 | Disposition: A | Discharge status: Home / Self Care | Payer: Self-pay | Attending: Emergency Medicine | Admitting: Emergency Medicine

## 2020-02-07 ENCOUNTER — Emergency Department: Payer: Self-pay

## 2020-02-07 DIAGNOSIS — Z79899 Other long term (current) drug therapy: Secondary | ICD-10-CM | POA: Insufficient documentation

## 2020-02-07 DIAGNOSIS — M25512 Pain in left shoulder: Secondary | ICD-10-CM | POA: Insufficient documentation

## 2020-02-07 DIAGNOSIS — Z87410 Personal history of cervical dysplasia: Secondary | ICD-10-CM | POA: Insufficient documentation

## 2020-02-07 DIAGNOSIS — R079 Chest pain, unspecified: Secondary | ICD-10-CM | POA: Insufficient documentation

## 2020-02-07 DIAGNOSIS — M25519 Pain in unspecified shoulder: Secondary | ICD-10-CM

## 2020-02-07 DIAGNOSIS — F17210 Nicotine dependence, cigarettes, uncomplicated: Secondary | ICD-10-CM | POA: Insufficient documentation

## 2020-02-07 DIAGNOSIS — I10 Essential (primary) hypertension: Secondary | ICD-10-CM | POA: Insufficient documentation

## 2020-02-07 DIAGNOSIS — R0789 Other chest pain: Secondary | ICD-10-CM

## 2020-02-07 DIAGNOSIS — M542 Cervicalgia: Secondary | ICD-10-CM | POA: Insufficient documentation

## 2020-02-07 LAB — BASIC METABOLIC PANEL
Anion gap: 8 (ref 5–15)
BUN: 14 mg/dL (ref 6–20)
CO2: 24 mmol/L (ref 22–32)
Calcium: 8.7 mg/dL — ABNORMAL LOW (ref 8.9–10.3)
Chloride: 108 mmol/L (ref 98–111)
Creatinine, Ser: 0.66 mg/dL (ref 0.44–1.00)
GFR calc Af Amer: >60 mL/min (ref >60)
GFR calc non Af Amer: >60 mL/min (ref >60)
Glucose, Bld: 93 mg/dL (ref 70–99)
Potassium: 3.6 mmol/L (ref 3.5–5.1)
Sodium: 140 mmol/L (ref 135–145)

## 2020-02-07 LAB — CBC
HCT: 36.8 % (ref 36.0–46.0)
Hemoglobin: 12.6 g/dL (ref 12.0–15.0)
MCH: 28.3 pg (ref 26.0–34.0)
MCHC: 34.2 g/dL (ref 30.0–36.0)
MCV: 82.5 fL (ref 80.0–100.0)
Platelets: 198 10*3/uL (ref 150–400)
RBC: 4.46 MIL/uL (ref 3.87–5.11)
RDW: 12.4 % (ref 11.5–15.5)
WBC: 6.2 10*3/uL (ref 4.0–10.5)
nRBC: 0 % (ref 0.0–0.2)

## 2020-02-07 LAB — TROPONIN I (HIGH SENSITIVITY)
Troponin I (High Sensitivity): 4 ng/L (ref <18)
Troponin I (High Sensitivity): 4 ng/L (ref <18)

## 2020-02-07 NOTE — ED Provider Notes (Signed)
[facility] Emergency Department Provider Note   ____________________________________________   First MD Initiated Contact with Patient 02/07/20 0912     (approximate)  I have reviewed the triage vital signs and the nursing notes.   HISTORY  Chief Complaint Shoulder Pain    HPI Janet Sheppard is a 34 y.o. female with a stated past medical history of anxiety who presents for left shoulder/neck/upper chest pain that began approximately 1 week ago and patient describes as an aching, nonradiating, intermittent 7/10 pain that is associated with some paresthesias and numbness down the left arm.  Patient states that using a heating pad and craning her neck to one side worsens the symptoms.  Patient denies any relieving factors.  Patient denies any recent trauma to the neck or otherwise.  Patient states that she can feel a "knot" to the left side of her neck.  Patient denies any fever, nausea/vomiting, unintentional weight loss, night sweats, hemoptysis.         Past Medical History:  Diagnosis Date  . Anemia   . Bronchitis   . History of echocardiogram    a. 12/2019 Echo: EF 60-65%, no rwma. Mildly enlarged RV. Nl valves.  . Hypertension   . Palpitations    a. 12/2019 Zio: RSR, 92 (54-170). Occas PVCs (1.6%), Rare PACs/vent bigeminy/trigeminy (<1%). Triggered events assoc w/ PVCs and also RSR. Bradycardia and vent bigeminy noted during hours of sleep.    Patient Active Problem List   Diagnosis Date Noted  . Palpitations 12/18/2019  . Chest pain of uncertain etiology 12/18/2019  . Tobacco use 06/02/2019  . Mixed anxiety and depressive disorder 06/26/2018  . Stasis dermatitis of both legs 09/15/2014  . Essential hypertension 09/03/2014  . Knee pain, bilateral 09/03/2014  . Morbid obesity (HCC) 02/25/2014  . Lymphedema 05/19/2013  . 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    History reviewed. No pertinent surgical history.  Prior  to Admission medications   Medication Sig Start Date End Date Taking? Authorizing Provider  amLODipine (NORVASC) 5 MG tablet Take 1 tablet (5 mg total) by mouth daily. 06/05/19 01/14/29  Darci Current, MD  DULoxetine (CYMBALTA) 30 MG capsule Takes occassionally 06/02/19 06/01/20  [provider]  Elastic Bandages & Supports (MEDICAL COMPRESSION STOCKINGS) MISC Please provide compression stockings 08/08/19   Minna Antis, MD  hydrochlorothiazide (HYDRODIURIL) 25 MG tablet Take 1 tablet (25 mg total) by mouth daily. 05/08/19   Irean Hong, MD  hydrOXYzine (ATARAX/VISTARIL) 25 MG tablet Take 1 tablet (25 mg total) by mouth 3 (three) times daily as needed for anxiety. 10/14/19   Minna Antis, MD  ibuprofen (ADVIL) 800 MG tablet Take 1 tablet (800 mg total) by mouth every 8 (eight) hours as needed for moderate pain. 05/08/19   Irean Hong, MD  medroxyPROGESTERone (DEPO-PROVERA) 150 MG/ML injection Inject 150 mg into the muscle every 3 (three) months.  02/25/19   [provider]    Allergies Doxycycline, Amoxicillin-pot clavulanate, Cephalexin, Septra [bactrim], and Sulfamethoxazole-trimethoprim  Family History  Problem Relation Age of Onset  . Arthritis Mother   . Heart Problems Maternal Grandmother        Open heart surgery  . Diabetes Maternal Grandmother   . COPD Maternal Grandfather   . Breast cancer Paternal Grandmother     Social History Social History   Tobacco Use  . Smoking status: Current Every Day Smoker    Packs/day: 0.25    Years: 11.00    Pack  years: 2.75    Types: Cigarettes  . Smokeless tobacco: Never Used  . Tobacco comment: 3 cigarettes per day  Vaping Use  . Vaping Use: Every day  Substance Use Topics  . Alcohol use: Yes    Alcohol/week: 3.0 standard drinks    Types: 3 Shots of liquor per week    Comment: occassionally  . Drug use: Not Currently    Types: Marijuana    Review of Systems Constitutional: No  fever/chills Eyes: No visual changes. ENT: No sore throat. Cardiovascular: Denies chest pain. Respiratory: Denies shortness of breath. Gastrointestinal: No abdominal pain.  No nausea, no vomiting.  No diarrhea. Genitourinary: Negative for dysuria. Musculoskeletal: Endorses left neck, left shoulder, and left upper chest wall pain Skin: Negative for rash. Neurological: Negative for headaches, weakness/numbness/paresthesias in any extremity Psychiatric: Negative for suicidal ideation/homicidal ideation   ____________________________________________   PHYSICAL EXAM:  VITAL SIGNS: ED Triage Vitals  Enc Vitals Group     BP 02/07/20 0121 (!) 149/87     Pulse Rate 02/07/20 0121 70     Resp 02/07/20 0121 17     Temp 02/07/20 0121 98.4 F (36.9 C)     Temp src --      SpO2 02/07/20 0121 100 %     Weight --      Height 02/07/20 0120 5\' 9"  (1.753 m)     Head Circumference --      Peak Flow --      Pain Score 02/07/20 0120 9     Pain Loc --      Pain Edu? --      Excl. in GC? --    Constitutional: Alert and oriented. Well appearing and in no acute distress. Eyes: Conjunctivae are normal. PERRL. Head: Atraumatic. Nose: No congestion/rhinnorhea. Mouth/Throat: Mucous membranes are moist. Neck: No stridor, tenderness to palpation in left cervical paraspinal musculature with muscles in spasm Cardiovascular: Normal rate, regular rhythm. Grossly normal heart sounds.  Good peripheral circulation. Respiratory: Normal respiratory effort.  No retractions. Gastrointestinal: Soft and nontender. No distention. Musculoskeletal: No lower extremity tenderness nor edema.  No joint effusions. Neurologic:  Normal speech and language. No gross focal neurologic deficits are appreciated. Skin:  Skin is warm and dry. No rash noted. Psychiatric: Mood and affect are normal. Speech and behavior are normal.  ____________________________________________   LABS (all labs ordered are listed, but only  abnormal results are displayed)  Labs Reviewed  BASIC METABOLIC PANEL - Abnormal; Notable for the following components:      Result Value   Calcium 8.7 (*)    All other components within normal limits  CBC  POC URINE PREG, ED  TROPONIN I (HIGH SENSITIVITY)  TROPONIN I (HIGH SENSITIVITY)   ____________________________________________  EKG  ED ECG REPORT I, 02/09/20, the attending physician, personally viewed and interpreted this ECG.  Date: 02/07/2020 EKG Time: 0125 Rate: 66 Rhythm: normal sinus rhythm QRS Axis: normal Intervals: normal ST/T Wave abnormalities: normal Narrative Interpretation: no evidence of acute ischemia  ____________________________________________  RADIOLOGY  ED MD interpretation: 2 view x-ray of the chest/left shoulder showed no evidence of acute abnormalities.  Specifically no pneumothorax, pneumonia, widened mediastinum, or acute fractures or dislocations  Official radiology report(s): DG Chest 2 View  Result Date: 02/07/2020 CLINICAL DATA:  Chest pain for 1 week EXAM: CHEST - 2 VIEW COMPARISON:  Radiograph 12/14/2019, contemporary left shoulder radiographs FINDINGS: No consolidation, features of edema, pneumothorax, or effusion. Pulmonary vascularity is normally distributed. The cardiomediastinal contours  are unremarkable. No acute osseous or soft tissue abnormality. Mild likely physiologic wedging of the thoracolumbar junction. IMPRESSION: No acute cardiopulmonary abnormality. Electronically Signed   By: Kreg Shropshire M.D.   On: 02/07/2020 02:08   DG Shoulder Left Port  Result Date: 02/07/2020 CLINICAL DATA:  Left shoulder pain for 1 week, no injury, now with chest pain EXAM: LEFT SHOULDER COMPARISON:  Contemporary chest radiograph, chest radiograph 12/14/2019 FINDINGS: There is no evidence of fracture or dislocation. Coracoclavicular and acromioclavicular intervals are maintained. There is no evidence of arthropathy or other focal bone  abnormality. Soft tissues are unremarkable. IMPRESSION: Negative. Electronically Signed   By: Kreg Shropshire M.D.   On: 02/07/2020 02:07    ____________________________________________   PROCEDURES  Procedure(s) performed (including Critical Care):  Procedures   ____________________________________________   INITIAL IMPRESSION / ASSESSMENT AND PLAN / ED COURSE  As part of my medical decision making, I reviewed the following data within the electronic MEDICAL RECORD NUMBER Nursing notes reviewed and incorporated, Labs reviewed, EKG interpreted, Old chart reviewed, Radiograph reviewed and Notes from prior ED visits reviewed and incorporated       + neck pain [+ sensation of weakness and numbness.]  ED Workup: Defer C-Spine imaging given negative by NEXUS criteria  Given History, Exam the patient appears to have a cervical radiculopathy.   Patient appears to be low risk for complications or other emergent conditions such as  anginal equivalent, frank cervical instability, arterial dissection, osteomyelitis, epidural abscess, central cord syndrome, c-spine fracture, other spinal emergencies  Rx: NSAIDs, outpatient physical therapy evaluation and recommendation for home exercises in the interim Disposition: Discharge. The patient has been given strict return precautions and understands the need to follow up within 48 hours with their primary care provider       ____________________________________________   FINAL CLINICAL IMPRESSION(S) / ED DIAGNOSES  Final diagnoses:  Shoulder pain  Acute pain of left shoulder  Neck pain on left side  Constricting chest pain often radiating down left upper extremity     ED Discharge Orders    None       Note:  This document was prepared using Dragon voice recognition software and may include unintentional dictation errors.   Merwyn Katos, MD 02/07/20 1028

## 2020-02-07 NOTE — ED Triage Notes (Addendum)
Patient c/o left shoulder pain X 1 week. Patient denies injury.   Patient now c/o chest pain.

## 2020-02-07 NOTE — ED Notes (Signed)
Patient to ED with complaints of left shoulder pain. Has swelling at left base of neck that is tender to palpation. States the pain radiates to the back of the neck and across the upper back. Patient states her left ear was tingling but denies drainage or pain in the left ear. No obvious redness or drainage from the ear itself. Denies tooth pain on the left upper or lower. Admits to a bad tooth on the right upper side.

## 2020-03-09 NOTE — Progress Notes (Signed)
@Patient  ID: , female    DOB: [birth year], 34 y.o.   MRN: [id number]  Chief Complaint  Patient presents with  . Consult    cannot sleep at night, makes noises in her sleep, dips in o2 while sleeping, wakes a lot during the night, goes to sleep very late and cant stay asleep, has fears of dying in her sleep, has not seen psych.    Referring provider: [id number]*  HPI: 34 year old, current every day smoker. PMH significant HTN, mixed anxiety/depression, morbid obesity.  Patient referred to [facility] pulmonary for sleep consult by Dr. [facility].   03/10/2020 Patient presents today for sleep consult. She has some anxiety about going to sleep which started 1 year ago. He uncle passed away around this time, he had chest pain the previous day and he died. She has been having chest pain at night, she went to ED room several times d/t anxiety. She has been seen by cardiology and had holter monitor for 14 days. She is worried about the chest pain she experiences and her weight. She has heard stories of people passing away in their sleep who were otherwise heathy. She was referred by cardiology. She has been told she snores and makes strange noises at night, she also reports daytime fatigue. She works at a day care. Goes to bed between 1-2am and takes her a long time to fall asleeping. She gets out of bed at 5am in the morning. She has lost close to 50 lbs in the last 2 years without trying. She does not eat a whole lot d/t her anxiety. Denies any sleep paralysis, sleep walking, sleep talking, nightmares, RLS, sleep hallucinations, cataplexy.   Sleep questionnaire Symptoms: Snoring, choking/apnea, restless sleep, daytime fatigue Time in bed: 1-2 am Time it takes to fall asleep: "A long time" Gets out of bed: 5 am  Number of times wakes up at night: 5 times  Epworth- 22  Allergies  Allergen Reactions  . Doxycycline   . Amoxicillin-Pot Clavulanate Rash  . Cephalexin Rash  .  Septra [Bactrim] Rash  . Sulfamethoxazole-Trimethoprim Rash    Immunization History  Administered Date(s) Administered  . Influenza,inj,Quad PF,6+ Mos 02/08/2015, 02/19/2017, 01/24/2018, 02/25/2019  . Influenza,inj,quad, With Preservative 06/22/2014  . PFIZER SARS-COV-2 Vaccination 09/29/2019, 10/20/2019  . Tdap 08/20/2017    Past Medical History:  Diagnosis Date  . Anemia   . Bronchitis   . History of echocardiogram    a. 12/2019 Echo: EF 60-65%, no rwma. Mildly enlarged RV. Nl valves.  . Hypertension   . Palpitations    a. 12/2019 Zio: RSR, 92 (54-170). Occas PVCs (1.6%), Rare PACs/vent bigeminy/trigeminy (<1%). Triggered events assoc w/ PVCs and also RSR. Bradycardia and vent bigeminy noted during hours of sleep.    Tobacco History: Social History   Tobacco Use  Smoking Status Former Smoker  . Packs/day: 0.25  . Years: 11.00  . Pack years: 2.75  . Types: Cigarettes  . Quit date: 02/09/2020  . Years since quitting: 0.0  Smokeless Tobacco Never Used  Tobacco Comment   3 cigarettes per day   Counseling given: Not Answered Comment: 3 cigarettes per day   Outpatient Medications Prior to Visit  Medication Sig Dispense Refill  . amLODipine (NORVASC) 5 MG tablet Take 1 tablet (5 mg total) by mouth daily. 30 tablet 0  . DULoxetine (CYMBALTA) 30 MG capsule Takes occassionally    . Elastic Bandages & Supports (MEDICAL COMPRESSION STOCKINGS) MISC Please provide 02/11/2020 compression  stockings 1 each 0  . hydrochlorothiazide (HYDRODIURIL) 25 MG tablet Take 1 tablet (25 mg total) by mouth daily. 30 tablet 0  . hydrOXYzine (ATARAX/VISTARIL) 25 MG tablet Take 1 tablet (25 mg total) by mouth 3 (three) times daily as needed for anxiety. 20 tablet 0  . ibuprofen (ADVIL) 800 MG tablet Take 1 tablet (800 mg total) by mouth every 8 (eight) hours as needed for moderate pain. 15 tablet 0  . medroxyPROGESTERone (DEPO-PROVERA) 150 MG/ML injection Inject 150 mg into the muscle every 3 (three)  months.      No facility-administered medications prior to visit.    Review of Systems  Review of Systems  Constitutional: Positive for fatigue and unexpected weight change.  Respiratory: Positive for chest tightness. Negative for cough, shortness of breath and wheezing.   Cardiovascular: Negative.   Psychiatric/Behavioral: Positive for sleep disturbance. The patient is nervous/anxious.     Physical Exam  BP 108/78 (BP Location: Left Wrist, Patient Position: Sitting, Cuff Size: Normal)   Pulse 71   Temp (!) 97.3 F (36.3 C) (Temporal)   Ht 5\' 9"  (1.753 m)   Wt (!) 323 lb (146.5 kg)   SpO2 100%   BMI 47.70 kg/m  Physical Exam Constitutional:      General: She is not in acute distress.    Appearance: Normal appearance. She is obese. She is not ill-appearing.  HENT:     Head: Normocephalic and atraumatic.     Mouth/Throat:     Comments: Deferred d/t masking Cardiovascular:     Rate and Rhythm: Normal rate and regular rhythm.     Heart sounds: Normal heart sounds. No murmur heard.   Pulmonary:     Effort: Pulmonary effort is normal.     Breath sounds: Normal breath sounds.  Musculoskeletal:        General: Normal range of motion.  Skin:    General: Skin is warm and dry.  Neurological:     General: No focal deficit present.     Mental Status: She is alert and oriented to person, place, and time. Mental status is at baseline.  Psychiatric:        Mood and Affect: Mood normal.        Behavior: Behavior normal.        Thought Content: Thought content normal.        Judgment: Judgment normal.      Lab Results:  CBC    Component Value Date/Time   WBC 6.2 02/07/2020 0133   RBC 4.46 02/07/2020 0133   HGB 12.6 02/07/2020 0133   HGB 13.2 12/25/2013 1728   HCT 36.8 02/07/2020 0133   HCT 42.1 12/25/2013 1728   PLT 198 02/07/2020 0133   PLT 215 12/25/2013 1728   MCV 82.5 02/07/2020 0133   MCV 84 12/25/2013 1728   MCH 28.3 02/07/2020 0133   MCHC 34.2 02/07/2020  0133   RDW 12.4 02/07/2020 0133   RDW 14.2 12/25/2013 1728   LYMPHSABS 2.7 08/08/2019 1938   LYMPHSABS 2.2 12/25/2013 1728   MONOABS 0.4 08/08/2019 1938   MONOABS 0.4 12/25/2013 1728   EOSABS 0.3 08/08/2019 1938   EOSABS 0.2 12/25/2013 1728   BASOSABS 0.1 08/08/2019 1938   BASOSABS 0.0 12/25/2013 1728    BMET    Component Value Date/Time   NA 140 02/07/2020 0133   NA 142 12/25/2013 1728   K 3.6 02/07/2020 0133   K 4.0 12/25/2013 1728   CL 108 02/07/2020 0133  CL 110 (H) 12/25/2013 1728   CO2 24 02/07/2020 0133   CO2 23 12/25/2013 1728   GLUCOSE 93 02/07/2020 0133   GLUCOSE 99 12/25/2013 1728   BUN 14 02/07/2020 0133   BUN 15 12/25/2013 1728   CREATININE 0.66 02/07/2020 0133   CREATININE 0.77 12/25/2013 1728   CALCIUM 8.7 (L) 02/07/2020 0133   CALCIUM 8.7 12/25/2013 1728   GFRNONAA >60 02/07/2020 0133   GFRNONAA >60 12/25/2013 1728   GFRAA >60 02/07/2020 0133   GFRAA >60 12/25/2013 1728    BNP No results found for: BNP  ProBNP No results found for: PROBNP  Imaging: No results found.   Assessment & Plan:   Snoring - Patient has snoring, sleep disruption, apnea and daytime fatigue. Epworth score is 22. Concern patient could have sleep apnea. Discussed how sleep apnea can affect various health problems including increased risk for hypertension, cardiovascular disease, arrythmia, stroke and diabetes. We also discussed how sleep disruption puts patient at increased risk for accidents such as while driving. Treatment options discussed including weight loss, side sleeping position, oral appliance, CPAP therapy and referral to ENT for consideration for possible surgical interventions. We will arrange for home sleep study sleep study (patient is self pay). FU in 6 weeks for televisit to review sleep study.    Anxiety - She has underlying anxiety d/t her uncle passing unexpectedly - She is reluctant to taking medication - Refer to behavioral health     Glenford Bayley, NP 03/10/2020

## 2020-03-10 ENCOUNTER — Other Ambulatory Visit: Payer: Self-pay

## 2020-03-10 ENCOUNTER — Encounter: Payer: Self-pay | Admitting: Primary Care

## 2020-03-10 ENCOUNTER — Ambulatory Visit (INDEPENDENT_AMBULATORY_CARE_PROVIDER_SITE_OTHER): Payer: Self-pay | Admitting: Primary Care

## 2020-03-10 VITALS — BP 108/78 | HR 71 | Temp 97.3°F | Ht 69.0 in | Wt 323.0 lb

## 2020-03-10 DIAGNOSIS — R0683 Snoring: Secondary | ICD-10-CM | POA: Insufficient documentation

## 2020-03-10 DIAGNOSIS — F419 Anxiety disorder, unspecified: Secondary | ICD-10-CM | POA: Insufficient documentation

## 2020-03-10 NOTE — Assessment & Plan Note (Signed)
-   She has underlying anxiety d/t her uncle passing unexpectedly - She is reluctant to taking medication - Refer to behavioral health

## 2020-03-10 NOTE — Patient Instructions (Addendum)
Reflux:  - Try taking over the counter Pepcid 20mg  at bedtime - Recommend stop eating 2 hours prior to sleeping  Snoring:  - Recommend sleeping on your side - Avoid excessive alcohol or sedating medication prior to bedtime  - Do not drive if experiencing excessive daytime fatigue or somnolence   Referral: - Behavorial health re: anxiety   Orders: - Home sleep study re: snoring   Follow-up: - 6 week televisit with Beth NP to review home sleep study results     Sleep Apnea Sleep apnea is a condition in which breathing pauses or becomes shallow during sleep. Episodes of sleep apnea usually last 10 seconds or longer, and they may occur as many as 20 times an hour. Sleep apnea disrupts your sleep and keeps your body from getting the rest that it needs. This condition can increase your risk of certain health problems, including:  Heart attack.  Stroke.  Obesity.  Diabetes.  Heart failure.  Irregular heartbeat. What are the causes? There are three kinds of sleep apnea:  Obstructive sleep apnea. This kind is caused by a blocked or collapsed airway.  Central sleep apnea. This kind happens when the part of the brain that controls breathing does not send the correct signals to the muscles that control breathing.  Mixed sleep apnea. This is a combination of obstructive and central sleep apnea. The most common cause of this condition is a collapsed or blocked airway. An airway can collapse or become blocked if:  Your throat muscles are abnormally relaxed.  Your tongue and tonsils are larger than normal.  You are overweight.  Your airway is smaller than normal. What increases the risk? You are more likely to develop this condition if you:  Are overweight.  Smoke.  Have a smaller than normal airway.  Are elderly.  Are female.  Drink alcohol.  Take sedatives or tranquilizers.  Have a family history of sleep apnea. What are the signs or symptoms? Symptoms of this  condition include:  Trouble staying asleep.  Daytime sleepiness and tiredness.  Irritability.  Loud snoring.  Morning headaches.  Trouble concentrating.  Forgetfulness.  Decreased interest in sex.  Unexplained sleepiness.  Mood swings.  Personality changes.  Feelings of depression.  Waking up often during the night to urinate.  Dry mouth.  Sore throat. How is this diagnosed? This condition may be diagnosed with:  A medical history.  A physical exam.  A series of tests that are done while you are sleeping (sleep study). These tests are usually done in a sleep lab, but they may also be done at home. How is this treated? Treatment for this condition aims to restore normal breathing and to ease symptoms during sleep. It may involve managing health issues that can affect breathing, such as high blood pressure or obesity. Treatment may include:  Sleeping on your side.  Using a decongestant if you have nasal congestion.  Avoiding the use of depressants, including alcohol, sedatives, and narcotics.  Losing weight if you are overweight.  Making changes to your diet.  Quitting smoking.  Using a device to open your airway while you sleep, such as: ? An oral appliance. This is a custom-made mouthpiece that shifts your lower jaw forward. ? A continuous positive airway pressure (CPAP) device. This device blows air through a mask when you breathe out (exhale). ? A nasal expiratory positive airway pressure (EPAP) device. This device has valves that you put into each nostril. ? A bi-level positive airway pressure (  BPAP) device. This device blows air through a mask when you breathe in (inhale) and breathe out (exhale).  Having surgery if other treatments do not work. During surgery, excess tissue is removed to create a wider airway. It is important to get treatment for sleep apnea. Without treatment, this condition can lead to:  High blood pressure.  Coronary artery  disease.  In men, an inability to achieve or maintain an erection (impotence).  Reduced thinking abilities. Follow these instructions at home: Lifestyle  Make any lifestyle changes that your health care provider recommends.  Eat a healthy, well-balanced diet.  Take steps to lose weight if you are overweight.  Avoid using depressants, including alcohol, sedatives, and narcotics.  Do not use any products that contain nicotine or tobacco, such as cigarettes, e-cigarettes, and chewing tobacco. If you need help quitting, ask your health care provider. General instructions  Take over-the-counter and prescription medicines only as told by your health care provider.  If you were given a device to open your airway while you sleep, use it only as told by your health care provider.  If you are having surgery, make sure to tell your health care provider you have sleep apnea. You may need to bring your device with you.  Keep all follow-up visits as told by your health care provider. This is important. Contact a health care provider if:  The device that you received to open your airway during sleep is uncomfortable or does not seem to be working.  Your symptoms do not improve.  Your symptoms get worse. Get help right away if:  You develop: ? Chest pain. ? Shortness of breath. ? Discomfort in your back, arms, or stomach.  You have: ? Trouble speaking. ? Weakness on one side of your body. ? Drooping in your face. These symptoms may represent a serious problem that is an emergency. Do not wait to see if the symptoms will go away. Get medical help right away. Call your local emergency services (911 in the [place] Do not drive yourself to the hospital. Summary  Sleep apnea is a condition in which breathing pauses or becomes shallow during sleep.  The most common cause is a collapsed or blocked airway.  The goal of treatment is to restore normal breathing and to ease symptoms during  sleep. This information is not intended to replace advice given to you by your health care provider. Make sure you discuss any questions you have with your health care provider. Document Revised: 10/16/2018 Document Reviewed: 12/25/2017 Elsevier Patient Education  2020 [employer].

## 2020-03-10 NOTE — Assessment & Plan Note (Addendum)
-   Patient has snoring, sleep disruption, apnea and daytime fatigue. Epworth score is 22. Concern patient could have sleep apnea. Discussed how sleep apnea can affect various health problems including increased risk for hypertension, cardiovascular disease, arrythmia, stroke and diabetes. We also discussed how sleep disruption puts patient at increased risk for accidents such as while driving. Treatment options discussed including weight loss, side sleeping position, oral appliance, CPAP therapy and referral to ENT for consideration for possible surgical interventions. We will arrange for home sleep study sleep study (patient is self pay). FU in 6 weeks for televisit to review sleep study.

## 2020-03-10 NOTE — Progress Notes (Signed)
Reviewed and agree with assessment/plan.   Navon Kotowski, MD [facility] 03/10/2020, 12:48 PM Pager:  [phone]  

## 2020-03-16 ENCOUNTER — Telehealth: Payer: Self-pay | Admitting: Primary Care

## 2020-03-16 NOTE — Telephone Encounter (Signed)
Patient called back again and Wyatt Mage was on the phone asking this same question then she also stated that it might be her and her brother that come and pick up the machine. I told her it really didn't matter

## 2020-03-17 ENCOUNTER — Other Ambulatory Visit: Payer: Self-pay

## 2020-03-17 ENCOUNTER — Ambulatory Visit: Payer: Self-pay

## 2020-03-17 DIAGNOSIS — G4733 Obstructive sleep apnea (adult) (pediatric): Secondary | ICD-10-CM

## 2020-03-17 DIAGNOSIS — R0683 Snoring: Secondary | ICD-10-CM

## 2020-03-23 ENCOUNTER — Telehealth: Payer: Self-pay | Admitting: Primary Care

## 2020-03-23 NOTE — Telephone Encounter (Signed)
Please let patient know HST showed mild OSA. She has an apt with me in December, if she would like she can move this apt up sooner to review results.

## 2020-03-23 NOTE — Telephone Encounter (Signed)
-----   Message from Coralyn Helling, MD sent at 03/19/2020  2:14 PM EDT ----- Home sleep study from 03/17/20 showed mild obstructive sleep apnea with an AHI of 5.6 and SpO2 low of 87%.   For some reason the report has Arlys John listed as referring provider, but you saw the patient.  Thanks.  Vineet

## 2020-03-23 NOTE — Telephone Encounter (Signed)
appt scheduled for 04/01/2020 at 11:00. Patient is aware and voiced her understanding.  Nothing further needed.

## 2020-03-23 NOTE — Telephone Encounter (Signed)
Yes, that's fine 

## 2020-03-23 NOTE — Telephone Encounter (Signed)
Spoke to patient and relayed results.  Patient would like sooner appointment to discuss results.   Beth, would a phone visit in [place] be okay?

## 2020-04-01 ENCOUNTER — Ambulatory Visit (INDEPENDENT_AMBULATORY_CARE_PROVIDER_SITE_OTHER): Payer: Self-pay | Admitting: Primary Care

## 2020-04-01 ENCOUNTER — Encounter: Payer: Self-pay | Admitting: Primary Care

## 2020-04-01 ENCOUNTER — Other Ambulatory Visit: Payer: Self-pay

## 2020-04-01 DIAGNOSIS — G4730 Sleep apnea, unspecified: Secondary | ICD-10-CM

## 2020-04-01 DIAGNOSIS — F419 Anxiety disorder, unspecified: Secondary | ICD-10-CM

## 2020-04-01 NOTE — Progress Notes (Signed)
Virtual Visit via Telephone Note  I connected with Janet Sheppard on 04/01/20 at 11:00 AM EST by telephone and verified that I am speaking with the correct person using two identifiers.  Location: Patient: Home Provider: Office   I discussed the limitations, risks, security and privacy concerns of performing an evaluation and management service by telephone and the availability of in person appointments. I also discussed with the patient that there may be a patient responsible charge related to this service. The patient expressed understanding and agreed to proceed.   History of Present Illness:  34 year old, current every day smoker. PMH significant HTN, mixed anxiety/depression, morbid obesity.  Patient referred to [facility] for sleep consult by Dr. Brion Aliment.   03/10/2020 Patient presents today for sleep consult. She has some anxiety about going to sleep which started 1 year ago. He uncle passed away around this time, he had chest pain the previous day and he died. She has been having chest pain at night, she went to ED room several times d/t anxiety. She has been seen by cardiology and had holter monitor for 14 days. She is worried about the chest pain she experiences and her weight. She has heard stories of people passing away in their sleep who were otherwise heathy. She was referred by cardiology. She has been told she snores and makes strange noises at night, she also reports daytime fatigue. She works at a day care. Goes to bed between 1-2am and takes her a long time to fall asleeping. She gets out of bed at 5am in the morning. She has lost close to 50 lbs in the last 2 years without trying. She does not eat a whole lot d/t her anxiety. Denies any sleep paralysis, sleep walking, sleep talking, nightmares, RLS, sleep hallucinations, cataplexy.   Sleep questionnaire Symptoms: Snoring, choking/apnea, restless sleep, daytime fatigue Time in bed: 1-2 am Time it takes to fall asleep:  "A long time" Gets out of bed: 5 am  Number of times wakes up at night: 5 times  Epworth- 22  04/01/2020 Patient contacted today to review sleep study results. HST 03/17/20 showed mild OSA; AHI 5.6 with SpO2 low 87% (average 98%). During last visit she expressed a lot of anxiety over going to sleep d/t her uncles passing. We reviewed treatment options including weight loss, side sleeping position, oral appliance, CPAP or referral to ENT for possible surgical options. Because her sleep study showed very mild sleep apnea I recommend weight loss and side sleeping position to start. She does not have insurance but will be getting it come January 2022. She is in the process of starting therapy/counseling with [facility], [facility].   Observations/Objective:  - Able to speak in full sentences; no overt shortness of breath or wheezing  Assessment and Plan:  Sleep apnea: - HST 03/17/20 showed mild OSA; AHI 5.6, SpO2 low 87% (average SpO2 98%) - We reviewed treatment options including weight loss, side sleeping position, oral appliance, CPAP or referral to ENT for possible surgical options. Because her sleep study showed very mild sleep apnea I recommend patient work on weight loss and encouraged side sleeping position to start.  - Advised patient not to drink alcohol in excess prior to bedtime or take sedation medication as these can worsen underlying sleep apnea. Do not drive if experiencing excessive daytime fatigue or somnolence  Anxiety: - Situational d/t loss of her Uncle, fear of going to sleep at night  - She is  in the process of establishing with behavioral health   Follow Up Instructions:  - Follow-up in 6 months with Dr. Craige Cotta or Wynona Neat   I discussed the assessment and treatment plan with the patient. The patient was provided an opportunity to ask questions and all were answered. The patient agreed with the plan and demonstrated an understanding of the  instructions.   The patient was advised to call back or seek an in-person evaluation if the symptoms worsen or if the condition fails to improve as anticipated.  I provided 22 minutes of non-face-to-face time during this encounter.   Glenford Bayley, NP

## 2020-04-01 NOTE — Patient Instructions (Addendum)
Home sleep study showed mild obstructive sleep apnea; you have 5.6 events an hours. Normal is <5 events an hour.   Treatment options including weight loss, side sleeping position, oral appliance, CPAP or referral to ENT for possible surgical options.  Because your sleep study showed very mild sleep apnea (almost normal) recommend starting off with weight loss (aim for 20 lbs) and side sleeping position  Follow-up:  6 months with Dr. Craige Cotta or Wynona Neat (new patient)   Sleep Apnea Sleep apnea affects breathing during sleep. It causes breathing to stop for a short time or to become shallow. It can also increase the risk of:  Heart attack.  Stroke.  Being very overweight (obese).  Diabetes.  Heart failure.  Irregular heartbeat. The goal of treatment is to help you breathe normally again. What are the causes? There are three kinds of sleep apnea:  Obstructive sleep apnea. This is caused by a blocked or collapsed airway.  Central sleep apnea. This happens when the brain does not send the right signals to the muscles that control breathing.  Mixed sleep apnea. This is a combination of obstructive and central sleep apnea. The most common cause of this condition is a collapsed or blocked airway. This can happen if:  Your throat muscles are too relaxed.  Your tongue and tonsils are too large.  You are overweight.  Your airway is too small. What increases the risk?  Being overweight.  Smoking.  Having a small airway.  Being older.  Being female.  Drinking alcohol.  Taking medicines to calm yourself (sedatives or tranquilizers).  Having family members with the condition. What are the signs or symptoms?  Trouble staying asleep.  Being sleepy or tired during the day.  Getting angry a lot.  Loud snoring.  Headaches in the morning.  Not being able to focus your mind (concentrate).  Forgetting things.  Less interest in sex.  Mood swings.  Personality  changes.  Feelings of sadness (depression).  Waking up a lot during the night to pee (urinate).  Dry mouth.  Sore throat. How is this diagnosed?  Your medical history.  A physical exam.  A test that is done when you are sleeping (sleep study). The test is most often done in a sleep lab but may also be done at home. How is this treated?   Sleeping on your side.  Using a medicine to get rid of mucus in your nose (decongestant).  Avoiding the use of alcohol, medicines to help you relax, or certain pain medicines (narcotics).  Losing weight, if needed.  Changing your diet.  Not smoking.  Using a machine to open your airway while you sleep, such as: ? An oral appliance. This is a mouthpiece that shifts your lower jaw forward. ? A CPAP device. This device blows air through a mask when you breathe out (exhale). ? An EPAP device. This has valves that you put in each nostril. ? A BPAP device. This device blows air through a mask when you breathe in (inhale) and breathe out.  Having surgery if other treatments do not work. It is important to get treatment for sleep apnea. Without treatment, it can lead to:  High blood pressure.  Coronary artery disease.  In men, not being able to have an erection (impotence).  Reduced thinking ability. Follow these instructions at home: Lifestyle  Make changes that your doctor recommends.  Eat a healthy diet.  Lose weight if needed.  Avoid alcohol, medicines to help you relax,  and some pain medicines.  Do not use any products that contain nicotine or tobacco, such as cigarettes, e-cigarettes, and chewing tobacco. If you need help quitting, ask your doctor. General instructions  Take over-the-counter and prescription medicines only as told by your doctor.  If you were given a machine to use while you sleep, use it only as told by your doctor.  If you are having surgery, make sure to tell your doctor you have sleep apnea. You may  need to bring your device with you.  Keep all follow-up visits as told by your doctor. This is important. Contact a doctor if:  The machine that you were given to use during sleep bothers you or does not seem to be working.  You do not get better.  You get worse. Get help right away if:  Your chest hurts.  You have trouble breathing in enough air.  You have an uncomfortable feeling in your back, arms, or stomach.  You have trouble talking.  One side of your body feels weak.  A part of your face is hanging down. These symptoms may be an emergency. Do not wait to see if the symptoms will go away. Get medical help right away. Call your local emergency services (911 in the [place] Do not drive yourself to the hospital. Summary  This condition affects breathing during sleep.  The most common cause is a collapsed or blocked airway.  The goal of treatment is to help you breathe normally while you sleep. This information is not intended to replace advice given to you by your health care provider. Make sure you discuss any questions you have with your health care provider. Document Revised: 02/15/2018 Document Reviewed: 12/25/2017 Elsevier Patient Education  2020 [employer].

## 2020-04-01 NOTE — Progress Notes (Signed)
Reviewed and agree with assessment/plan.   Kalief Kattner, MD [facility] 04/01/2020, 12:15 PM Pager:  [phone]  

## 2020-04-21 ENCOUNTER — Ambulatory Visit: Payer: Self-pay | Admitting: Primary Care

## 2020-07-01 NOTE — Progress Notes (Signed)
Follow-up Outpatient Visit Date: 07/02/2020  Primary Care Provider: [facility] [address] [facility] [place] [address]  Chief Complaint: Chest pain, neck pain, arm pain, breast pain, and anxiety  HPI:  Janet Sheppard is a 35 y.o. female with history of hypertension, anemia, and obesity, who presents for follow-up of palpitations.  She was last seen in our office in 01/2020 by Ward Givens, NP, at which time she noted continued intermittent palpitations (prior event monitor showed occasional PVC's).  She was referred to pulmonary for evaluation of possible sleep apnea.  Sleep study showed mild OSA; CPAP was deferred.  Today, Janet Sheppard has multiple concerns, including intermittent left-sided chest pain, arm pain, and neck pain.  Discomfort comes on randomly and is not exertional or related to a specific activity.  It waxes and wanes in duration.  At times she feels like it could be heartburn or a pinched nerve.  Sometimes the pain is achy.  Janet Sheppard has also felt a strange feeling under her left breast.  It is mildly tender along the inferior costal margin.  She has not appreciated a lump in this area.  She is concerned that her heart rate seems to slow down transiently when she takes a deep breath in.  She also notes occasional palpitations that she describes as skipped beats.  She denies shortness of breath and edema.  She remains concerned about feeling anxious a lot despite going to therapy.  --------------------------------------------------------------------------------------------------  Cardiovascular History & Procedures: Cardiovascular Problems:  Palpitations  Chest pain  Risk Factors:  Hypertension, tobacco use, and morbid obesity  Cath/PCI:  None  CV Surgery:  None  EP Procedures and Devices:  14-day event monitor (12/17/2019): Predominantly sinus rhythm with rare PAC's and occasional PVC's.  Non-Invasive Evaluation(s):  TTE (01/08/2020): Normal LV size  and wall thickness.  LVEF 60-65% with normal diastolic function.  Mildly dilated RV with normal function.  No significant valvular abnormality noted.  CVP elevated.   Recent CV Pertinent Labs: Lab Results  Component Value Date   INR 1.01 12/28/2016   K 3.6 02/07/2020   K 4.0 12/25/2013   BUN 14 02/07/2020   BUN 15 12/25/2013   CREATININE 0.66 02/07/2020   CREATININE 0.77 12/25/2013    Past medical and surgical history were reviewed and updated in EPIC.  Current Meds  Medication Sig  . amLODipine (NORVASC) 5 MG tablet Take 1 tablet (5 mg total) by mouth daily.  Jae Dire Bandages & Supports (MEDICAL COMPRESSION STOCKINGS) MISC Please provide compression stockings  . hydrochlorothiazide (HYDRODIURIL) 25 MG tablet Take 1 tablet (25 mg total) by mouth daily.  Marland Kitchen ibuprofen (ADVIL) 800 MG tablet Take 1 tablet (800 mg total) by mouth every 8 (eight) hours as needed for moderate pain.  . medroxyPROGESTERone (DEPO-PROVERA) 150 MG/ML injection Inject 150 mg into the muscle every 3 (three) months.     Allergies: Doxycycline, Amoxicillin-pot clavulanate, Cephalexin, Septra [bactrim], and Sulfamethoxazole-trimethoprim  Social History   Tobacco Use  . Smoking status: Former Smoker    Packs/day: 0.25    Years: 11.00    Pack years: 2.75    Types: Cigarettes    Quit date: 02/09/2020    Years since quitting: 0.3  . Smokeless tobacco: Never Used  . Tobacco comment: 3 cigarettes per day  Vaping Use  . Vaping Use: Every day  Substance Use Topics  . Alcohol use: Yes    Alcohol/week: 3.0 standard drinks    Types: 3 Shots of liquor  per week    Comment: occassionally  . Drug use: Not Currently    Types: Marijuana    Family History  Problem Relation Age of Onset  . Arthritis Mother   . Heart Problems Maternal Grandmother        Open heart surgery  . Diabetes Maternal Grandmother   . COPD Maternal Grandfather   . Breast cancer Paternal Grandmother     Review of Systems: A  12-system review of systems was performed and was negative except as noted in the HPI.  --------------------------------------------------------------------------------------------------  Physical Exam: BP 130/84 (BP Location: Left Arm, Patient Position: Sitting, Cuff Size: Large)   Pulse 79   Ht 5\' 9"  (1.753 m)   Wt (!) 335 lb 6 oz (152.1 kg)   SpO2 99%   BMI 49.53 kg/m   General:  NAD. Neck: No JVD or HJR, though body habitus limits evaluation. Lungs: Clear to auscultation bilaterally without wheezes or crackles. Heart: Regular rate and rhythm without murmurs, rubs, or gallops. Abdomen: Soft, nontender, nondistended. Extremities: No lower extremity edema.  EKG:  Normal sinus rhythm without abnormality.  Lab Results  Component Value Date   WBC 6.2 02/07/2020   HGB 12.6 02/07/2020   HCT 36.8 02/07/2020   MCV 82.5 02/07/2020   PLT 198 02/07/2020    Lab Results  Component Value Date   NA 140 02/07/2020   K 3.6 02/07/2020   CL 108 02/07/2020   CO2 24 02/07/2020   BUN 14 02/07/2020   CREATININE 0.66 02/07/2020   GLUCOSE 93 02/07/2020   ALT 34 06/04/2019    No results found for: CHOL, HDL, LDLCALC, LDLDIRECT, TRIG, CHOLHDL  --------------------------------------------------------------------------------------------------  ASSESSMENT AND PLAN: Chest, neck, and left arm pain: Pain is nonspecific and not typical of angina.  Examination today is unremarkable, though it is limited by body habitus.  EKG is normal.  Prior echo did not reveal any significant structural abnormalities.  Given her young age, ischemic heart disease is less likely.  I wonder if PVC's may be contributing to her symptoms.  I have recommended a trial of metoprolol succinate 25 mg daily.  If symptoms persist despite medical therapy and ongoing treatment of anxiety, ischemia evaluation may need to be considered (body habitus will make this more challenging).  I would favor myocardial PET/CT at [facility], if  needed, given improved sensitivity/specificity with her BMI of ~50, rather than SPECT or CTA.  I do not feel that the benefits of cardiac catheterization outweigh potential risks at this time.  Left breast pain: It is difficult to determine if discomfort is related to the left breast or underlying chest wall.  As she is scheduled to see her PCP next week, breast exam was not performed today.  I advised Ms. Rasmussen to discuss this with her PCP to determine if additional testing/intervention is necessary.  PVC's: Occasional PVC's noted by event monitor last year.  Query if PVC's are contributing to her atypical chest pain.  We will initiate an empiric trial of metoprolol succinate 25 mg daily.  Hypertension: BP upper normal today.  As above, we will start low-dose metoprolol.  Continue current doses of amlodipine and HCTZ.  Morbid obesity: BMI ~50 with multiple comorbidities (HTN and OSA).  Weight loss encouraged.  Follow-up: Return to clinic in 1 month.  [facility], MD 07/02/2020 9:01 AM

## 2020-07-02 ENCOUNTER — Other Ambulatory Visit: Payer: Self-pay

## 2020-07-02 ENCOUNTER — Encounter: Payer: Self-pay | Admitting: Internal Medicine

## 2020-07-02 ENCOUNTER — Encounter: Payer: Self-pay | Admitting: *Deleted

## 2020-07-02 ENCOUNTER — Ambulatory Visit (INDEPENDENT_AMBULATORY_CARE_PROVIDER_SITE_OTHER): Payer: BLUE CROSS/BLUE SHIELD | Admitting: Internal Medicine

## 2020-07-02 VITALS — BP 130/84 | HR 79 | Ht 69.0 in | Wt 335.4 lb

## 2020-07-02 DIAGNOSIS — I10 Essential (primary) hypertension: Secondary | ICD-10-CM | POA: Diagnosis not present

## 2020-07-02 DIAGNOSIS — R0789 Other chest pain: Secondary | ICD-10-CM

## 2020-07-02 DIAGNOSIS — I493 Ventricular premature depolarization: Secondary | ICD-10-CM | POA: Diagnosis not present

## 2020-07-02 DIAGNOSIS — N644 Mastodynia: Secondary | ICD-10-CM | POA: Diagnosis not present

## 2020-07-02 MED ORDER — METOPROLOL SUCCINATE ER 25 MG PO TB24: 25.0000 mg | ORAL_TABLET | Freq: Every day | ORAL | 2 refills | Status: AC

## 2020-07-02 NOTE — Patient Instructions (Signed)
Medication Instructions:  Your physician has recommended you make the following change in your medication:  1- START Metoprolol succinate 25 mg by mouth once a day.  *If you need a refill on your cardiac medications before your next appointment, please call your pharmacy*  Follow-Up: At [facility], you and your health needs are our priority.  As part of our continuing mission to provide you with exceptional heart care, we have created designated Provider Care Teams.  These Care Teams include your primary Cardiologist (physician) and Advanced Practice Providers (APPs -  Physician Assistants and Nurse Practitioners) who all work together to provide you with the care you need, when you need it.  We recommend signing up for the patient portal called "MyChart".  Sign up information is provided on this After Visit Summary.  MyChart is used to connect with patients for Virtual Visits (Telemedicine).  Patients are able to view lab/test results, encounter notes, upcoming appointments, etc.  Non-urgent messages can be sent to your provider as well.   To learn more about what you can do with MyChart, go to [email or web address].    Your next appointment:   1 month(s)  The format for your next appointment:   In Person  Provider:   You may see Yvonne Kendall, MD or one of the following Advanced Practice Providers on your designated Care Team:    Nicolasa Ducking, NP  Eula Listen, PA-C  Marisue Ivan, PA-C  Cadence Amo, [place]  Gillian Shields, NP

## 2020-07-11 DIAGNOSIS — B373 Candidiasis of vulva and vagina: Secondary | ICD-10-CM | POA: Insufficient documentation

## 2020-07-11 DIAGNOSIS — B3731 Acute candidiasis of vulva and vagina: Secondary | ICD-10-CM | POA: Insufficient documentation

## 2020-07-11 DIAGNOSIS — B372 Candidiasis of skin and nail: Secondary | ICD-10-CM | POA: Insufficient documentation

## 2020-07-21 ENCOUNTER — Ambulatory Visit: Payer: Self-pay | Admitting: Internal Medicine

## 2020-07-30 ENCOUNTER — Ambulatory Visit: Payer: BLUE CROSS/BLUE SHIELD | Admitting: Family

## 2020-08-20 NOTE — Progress Notes (Deleted)
No-show to initial evaluation °

## 2020-08-22 DIAGNOSIS — M899 Disorder of bone, unspecified: Secondary | ICD-10-CM | POA: Insufficient documentation

## 2020-08-22 DIAGNOSIS — Z79899 Other long term (current) drug therapy: Secondary | ICD-10-CM | POA: Insufficient documentation

## 2020-08-22 DIAGNOSIS — G894 Chronic pain syndrome: Secondary | ICD-10-CM | POA: Insufficient documentation

## 2020-08-22 DIAGNOSIS — Z789 Other specified health status: Secondary | ICD-10-CM | POA: Insufficient documentation

## 2020-08-23 ENCOUNTER — Ambulatory Visit: Payer: BLUE CROSS/BLUE SHIELD | Admitting: Family

## 2020-08-23 ENCOUNTER — Ambulatory Visit: Payer: BLUE CROSS/BLUE SHIELD | Admitting: Pain Medicine

## 2020-08-23 ENCOUNTER — Other Ambulatory Visit: Payer: Self-pay | Admitting: Pain Medicine

## 2020-08-23 DIAGNOSIS — M899 Disorder of bone, unspecified: Secondary | ICD-10-CM

## 2020-08-23 DIAGNOSIS — G894 Chronic pain syndrome: Secondary | ICD-10-CM

## 2020-08-23 DIAGNOSIS — Z789 Other specified health status: Secondary | ICD-10-CM

## 2020-08-23 DIAGNOSIS — Z79899 Other long term (current) drug therapy: Secondary | ICD-10-CM

## 2020-08-23 NOTE — Progress Notes (Signed)
Patient: Janet Sheppard  Service Category: E/M  Provider: Gaspar Cola, MD  DOB: 12/11/1985  DOS: 08/23/2020  Referring Provider: Verita Lamb, NP  MRN: [id number]  Setting: Ambulatory outpatient  PCP: Janet Sheppard  Type: New Patient  Specialty: Interventional Pain Management     The information below was gathered through the precharting assessment prior to the 08/23/2020 visit that the patient did not keep.  No-show to initial evaluation on 08/23/2020.  Historic Controlled Substance Pharmacotherapy Review  PMP and historical list of controlled substances: Tramadol 50 mg, 1 tab p.o. 4 times daily (200 mg/day of tramadol) (20 MME/day) Current opioid analgesics: Tramadol 50 mg, 1 tab p.o. 4 times daily (200 mg/day of tramadol) (20 MME/day)  MME/day: 20 mg/day  Historical Background Evaluation: PMP NARX Score Report:  Narcotic: 150 Sedative: 060 Stimulant: 000  Risk Assessment Profile: PMP NARX Overdose Risk Score: 200  Meds   Current Outpatient Medications:  .  amLODipine (NORVASC) 5 MG tablet, Take 1 tablet (5 mg total) by mouth daily., Disp: 30 tablet, Rfl: 0 .  Elastic Bandages & Supports (MEDICAL COMPRESSION STOCKINGS) MISC, Please provide 32mHg compression stockings, Disp: 1 each, Rfl: 0 .  hydrochlorothiazide (HYDRODIURIL) 25 MG tablet, Take 1 tablet (25 mg total) by mouth daily., Disp: 30 tablet, Rfl: 0 .  ibuprofen (ADVIL) 800 MG tablet, Take 1 tablet (800 mg total) by mouth every 8 (eight) hours as needed for moderate pain., Disp: 15 tablet, Rfl: 0 .  medroxyPROGESTERone (DEPO-PROVERA) 150 MG/ML injection, Inject 150 mg into the muscle every 3 (three) months. , Disp: , Rfl:  .  metoprolol succinate (TOPROL XL) 25 MG 24 hr tablet, Take 1 tablet (25 mg total) by mouth daily., Disp: 30 tablet, Rfl: 2  Imaging Review  Knee Imaging: Knee-L DG 4 views: Results for orders placed during the hospital encounter of 08/15/16 DG Knee Complete 4 Views  Left  Narrative CLINICAL DATA:  Pain and swelling for 2 weeks  EXAM: LEFT KNEE - COMPLETE 4+ VIEW  COMPARISON:  November 17, 2015  FINDINGS: Frontal, lateral, and bilateral oblique views were obtained. No fracture or dislocation. No joint effusion. There is slight in each compartment, although there is no appreciable joint space narrowing. No erosive change.  IMPRESSION: Rather minimal osteoarthritic change. No fracture or joint effusion.   Electronically Signed By: WLowella GripIII M.D. On: 08/15/2016 20:13  Foot Imaging: Foot-R DG Complete: Results for orders placed during the hospital encounter of 12/02/14 DG Foot Complete Right  Narrative CLINICAL DATA:  Right foot pain and injury while walking several days ago. Initial encounter.  EXAM: RIGHT FOOT COMPLETE - 3+ VIEW  COMPARISON:  None.  FINDINGS: There is no evidence of fracture or dislocation.  Mild spurring is seen involving the talonavicular and calcaneocuboid joints. A small plantar calcaneal bone spur also noted.  IMPRESSION: No acute findings.   Electronically Signed By: JEarle GellM.D. On: 12/02/2014 16:21  Foot-L DG Complete: Results for orders placed during the hospital encounter of 12/28/16 DG Foot Complete Left  Narrative CLINICAL DATA:  Left foot pain and swelling for 1 month  EXAM: LEFT FOOT - COMPLETE 3+ VIEW  COMPARISON:  None.  FINDINGS: There is no evidence of fracture or dislocation. There is no evidence of arthropathy or other focal bone abnormality. Soft tissues are unremarkable.  IMPRESSION: Negative.   Electronically Signed By: KDonavan FoilM.D. On: 12/28/2016 20:32  Complexity Note: Imaging results reviewed.  Allergies  Janet Sheppard is allergic to doxycycline, amoxicillin-pot clavulanate, cephalexin, septra [bactrim], and sulfamethoxazole-trimethoprim.  Laboratory Chemistry Profile   Renal Lab Results  Component Value Date   BUN 14  02/07/2020   CREATININE 0.66 02/07/2020   GFRAA >60 02/07/2020   GFRNONAA >60 02/07/2020   PROTEINUR NEGATIVE 12/14/2019     Electrolytes Lab Results  Component Value Date   NA 140 02/07/2020   K 3.6 02/07/2020   CL 108 02/07/2020   CALCIUM 8.7 (L) 02/07/2020     Hepatic Lab Results  Component Value Date   AST 26 06/04/2019   ALT 34 06/04/2019   ALBUMIN 4.1 06/04/2019   ALKPHOS 39 06/04/2019   LIPASE 18 (L) 02/17/2015     ID Lab Results  Component Value Date   SARSCOV2NAA NEGATIVE 12/01/2019   PREGTESTUR NEGATIVE 10/16/2016     Bone No results found for: [place], [id number], TD4287GO1, LX7262MB5, 25OHVITD1, 25OHVITD2, 25OHVITD3, TESTOFREE, TESTOSTERONE   Endocrine Lab Results  Component Value Date   GLUCOSE 93 02/07/2020   GLUCOSEU NEGATIVE 12/14/2019   TSH 2.000 06/05/2019     Neuropathy No results found for: VITAMINB12, FOLATE, HGBA1C, HIV   CNS No results found for: COLORCSF, APPEARCSF, RBCCOUNTCSF, WBCCSF, POLYSCSF, LYMPHSCSF, EOSCSF, PROTEINCSF, GLUCCSF, JCVIRUS, CSFOLI, IGGCSF, LABACHR, ACETBL, LABACHR, ACETBL   Inflammation (CRP: Acute  ESR: Chronic) Lab Results  Component Value Date   ESRSEDRATE 5 07/21/2012     Rheumatology Lab Results  Component Value Date   LABURIC 5.5 12/28/2016     Coagulation Lab Results  Component Value Date   INR 1.01 12/28/2016   LABPROT 13.3 12/28/2016   PLT 198 02/07/2020   DDIMER <0.22 12/09/2010     Cardiovascular Lab Results  Component Value Date   TROPONINI <0.03 02/08/2016   HGB 12.6 02/07/2020   HCT 36.8 02/07/2020     Screening Lab Results  Component Value Date   SARSCOV2NAA NEGATIVE 12/01/2019   PREGTESTUR NEGATIVE 10/16/2016     Cancer No results found for: CEA, CA125, LABCA2   Allergens No results found for: ALMOND, APPLE, ASPARAGUS, AVOCADO, BANANA, BARLEY, BASIL, BAYLEAF, GREENBEAN, LIMABEAN, WHITEBEAN, BEEFIGE, REDBEET, BLUEBERRY, BROCCOLI, CABBAGE, MELON, CARROT, CASEIN, CASHEWNUT,  CAULIFLOWER, CELERY     Note: Lab results reviewed.  [place]   No past surgical history on file. Active Ambulatory Problems    Diagnosis Date Noted  . 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 Essential hypertension 09/03/2014  . Knee pain, bilateral 09/03/2014  . Lymphedema 05/19/2013  . Mixed anxiety and depressive disorder 06/26/2018  . Morbid obesity ([place]) 02/25/2014  . Stasis dermatitis of both legs 09/15/2014  . Tobacco use 06/02/2019  . Palpitations 12/18/2019  . Chest pain of uncertain etiology 59/74/1638  . Snoring 03/10/2020  . Anxiety 03/10/2020  . Pain of left breast 07/02/2020  . PVC's (premature ventricular contractions) 07/02/2020  . Candidal intertrigo 07/11/2020  . Vaginal candidiasis 07/11/2020  . Chronic pain syndrome 08/22/2020  . Pharmacologic therapy 08/22/2020  . Disorder of skeletal system 08/22/2020  . Problems influencing health status 08/22/2020   Resolved Ambulatory Problems    Diagnosis Date Noted  . No Resolved Ambulatory Problems   Past Medical History:  Diagnosis Date  . Anemia   . Bronchitis   . History of echocardiogram   . Hypertension    Constitutional Exam  BMI Assessment: Estimated body mass index is 49.53 kg/m as calculated from the following:   Height as of 07/02/20: 5' 9"  (1.753 m).   Weight as  of 07/02/20: 335 lb 6 oz (152.1 kg).  BMI Readings from Last 4 Encounters:  07/02/20 49.53 kg/m  03/10/20 47.70 kg/m  02/07/20 48.88 kg/m  01/15/20 48.88 kg/m   Wt Readings from Last 4 Encounters:  07/02/20 (!) 335 lb 6 oz (152.1 kg)  03/10/20 (!) 323 lb (146.5 kg)  01/15/20 (!) 331 lb (150.1 kg)  12/17/19 (!) 333 lb 6.4 oz (151.2 kg)   Note by: Janet Cola, MD Date: 08/23/2020; Time: 10:17 AM

## 2020-08-23 NOTE — Progress Notes (Deleted)
Office Visit    Patient Name: Janet Sheppard Date of Encounter: 08/23/2020  PCP:  [facility]   [facility]  Cardiologist:  Yvonne Kendall, MD  Advanced Practice Provider:  No care team member to display Electrophysiologist:  None   Chief Complaint    Shoua Ulloa is a 35 y.o. female with a hx of hypertension, anemia, obesity, palpitations, mild sleep apnea not recommended for CPAP presents today for follow-up after cardiac testing  Past Medical History    Past Medical History:  Diagnosis Date  . Anemia   . Bronchitis   . History of echocardiogram    a. 12/2019 Echo: EF 60-65%, no rwma. Mildly enlarged RV. Nl valves.  . Hypertension   . Palpitations    a. 12/2019 Zio: RSR, 92 (54-170). Occas PVCs (1.6%), Rare PACs/vent bigeminy/trigeminy (<1%). Triggered events assoc w/ PVCs and also RSR. Bradycardia and vent bigeminy noted during hours of sleep.   No past surgical history on file.  Allergies  Allergies  Allergen Reactions  . Doxycycline   . Amoxicillin-Pot Clavulanate Rash  . Cephalexin Rash  . Septra [Bactrim] Rash  . Sulfamethoxazole-Trimethoprim Rash    History of Present Illness    Ninah Moccio is a 35 y.o. female with a hx of *** last seen 07/02/2020 by Dr. Okey Dupre.  She was evaluated in the emergency department August 2021 for palpitations noting PVCs on telemetry with work-up otherwise unremarkable.  She was seen in follow-up a few days later noting atypical chest pain felt likely to be musculoskeletal or GI in etiology.  She was recommended to take Pepcid.  Echo 01/08/2020 LVEF 60 to 65%, no wall motion abnormalities, normal diastolic parameters, RV SF normal, RV mildly enlarged, no significant valvular abnormalities, RA pressure 15 mmHg.  ZIO 12/17/2019 predominantly NSR average heart rate 90 bpm, occasional PVC with 1.6% burden, rare PAC and triggered events corresponded to sinus rhythm and sinus rhythm with PVC.  At  follow-up 01/2020 noted that her perception of PVC occurrence is greater than identified on monitoring.  She elected to defer beta-blocker therapy.    At follow-up 07/02/2020 noted intermittent left-sided chest pain, arm pain, neck pain.  Discomfort occurred randomly and was nonexertional.  She described it as other heartburn and pinched nerve and also achy.  She was noted to have mild tenderness along the inferior costal margin under her left breast.  She was recommended for trial of metoprolol succinate 25 mg daily.  It was noted that if ischemic evaluation was pursued she would likely require myocardial PET/CT at [facility] given BMI of approximately 50 and need for increased sensitivity/specificity.  She presents today for follow-up.  EKGs/Labs/Other Studies Reviewed:   The following studies were reviewed today:  EP Procedures and Devices:  14-day event monitor (12/17/2019): Predominantly sinus rhythm with rare PAC's and occasional PVC's.   Non-Invasive Evaluation(s):  TTE (01/08/2020): Normal LV size and wall thickness.  LVEF 60-65% with normal diastolic function.  Mildly dilated RV with normal function.  No significant valvular abnormality noted.  CVP elevated.  EKG:  EKG is  ordered today.  The ekg ordered today demonstrates ***  Recent Labs: 02/07/2020: BUN 14; Creatinine, Ser 0.66; Hemoglobin 12.6; Platelets 198; Potassium 3.6; Sodium 140  Recent Lipid Panel No results found for: CHOL, TRIG, HDL, CHOLHDL, VLDL, LDLCALC, LDLDIRECT  Home Medications   No outpatient medications have been marked as taking for the 08/23/20 encounter (Appointment) with Alver Sorrow, NP.    Review  of Systems  All other systems reviewed and are otherwise negative except as noted above.  Physical Exam    VS:  There were no vitals taken for this visit. , BMI There is no height or weight on file to calculate BMI.  Wt Readings from Last 3 Encounters:  07/02/20 (!) 335 lb 6 oz (152.1 kg)  03/10/20 (!) 323 lb  (146.5 kg)  01/15/20 (!) 331 lb (150.1 kg)    GEN: Well nourished, well developed, in no acute distress. HEENT: normal. Neck: Supple, no JVD, carotid bruits, or masses. Cardiac: ***RRR, no murmurs, rubs, or gallops. No clubbing, cyanosis, edema.  ***Radials/DP/PT 2+ and equal bilaterally.  Respiratory:  ***Respirations regular and unlabored, clear to auscultation bilaterally. GI: Soft, nontender, nondistended. MS: No deformity or atrophy. Skin: Warm and dry, no rash. Neuro:  Strength and sensation are intact. Psych: Normal affect.  Assessment & Plan    1. Chest pain-since addition of metoprolol succinate ***  2. Palpitations/PVCs-  Echo 12/2019 with normal LVEF and no significant valvular abnormalities. ZIO 12/2019 with occasional PVC 1.6% burden. Since addition of metoprolol succinate. ***  3. Left breast pain-  4. Hypertension-  5. Morbid obesity-  Disposition: Follow up {follow up:15908} with Dr. Okey Dupre or APP  Signed, Alver Sorrow, NP 08/23/2020, 9:40 AM [facility]

## 2020-09-23 NOTE — Progress Notes (Deleted)
No-show to second initial evaluation. °

## 2020-09-27 ENCOUNTER — Encounter: Payer: Self-pay | Admitting: Pain Medicine

## 2020-09-27 ENCOUNTER — Other Ambulatory Visit: Payer: Self-pay | Admitting: Pain Medicine

## 2020-09-27 ENCOUNTER — Ambulatory Visit: Payer: BLUE CROSS/BLUE SHIELD | Admitting: Pain Medicine

## 2020-09-27 DIAGNOSIS — Z789 Other specified health status: Secondary | ICD-10-CM

## 2020-09-27 DIAGNOSIS — Z79899 Other long term (current) drug therapy: Secondary | ICD-10-CM

## 2020-09-27 DIAGNOSIS — Z5329 Procedure and treatment not carried out because of patient's decision for other reasons: Secondary | ICD-10-CM | POA: Insufficient documentation

## 2020-09-27 DIAGNOSIS — Z91199 Patient's noncompliance with other medical treatment and regimen due to unspecified reason: Secondary | ICD-10-CM | POA: Insufficient documentation

## 2020-09-27 DIAGNOSIS — G894 Chronic pain syndrome: Secondary | ICD-10-CM

## 2020-09-27 DIAGNOSIS — M899 Disorder of bone, unspecified: Secondary | ICD-10-CM

## 2020-09-27 NOTE — Progress Notes (Signed)
No-show to initial evaluation appointments with Dr. Laban Emperor (chronic pain management) on 08/23/2020 and 09/27/2020.

## 2020-12-16 ENCOUNTER — Emergency Department
Admission: EM | Admit: 2020-12-16 | Discharge: 2020-12-16 | Disposition: A | Discharge status: Home / Self Care | Payer: BLUE CROSS/BLUE SHIELD | Attending: Emergency Medicine | Admitting: Emergency Medicine

## 2020-12-16 ENCOUNTER — Other Ambulatory Visit: Payer: Self-pay

## 2020-12-16 DIAGNOSIS — Z5321 Procedure and treatment not carried out due to patient leaving prior to being seen by health care provider: Secondary | ICD-10-CM | POA: Insufficient documentation

## 2020-12-16 DIAGNOSIS — R000 Tachycardia, unspecified: Secondary | ICD-10-CM | POA: Insufficient documentation

## 2020-12-16 DIAGNOSIS — R002 Palpitations: Secondary | ICD-10-CM | POA: Insufficient documentation

## 2020-12-16 LAB — CBC
HCT: 38.1 % (ref 36.0–46.0)
Hemoglobin: 12.9 g/dL (ref 12.0–15.0)
MCH: 28.7 pg (ref 26.0–34.0)
MCHC: 33.9 g/dL (ref 30.0–36.0)
MCV: 84.7 fL (ref 80.0–100.0)
Platelets: 207 10*3/uL (ref 150–400)
RBC: 4.5 MIL/uL (ref 3.87–5.11)
RDW: 12 % (ref 11.5–15.5)
WBC: 5.7 10*3/uL (ref 4.0–10.5)
nRBC: 0 % (ref 0.0–0.2)

## 2020-12-16 LAB — COMPREHENSIVE METABOLIC PANEL
ALT: 20 U/L (ref 0–44)
AST: 19 U/L (ref 15–41)
Albumin: 4.2 g/dL (ref 3.5–5.0)
Alkaline Phosphatase: 41 U/L (ref 38–126)
Anion gap: 5 (ref 5–15)
BUN: 10 mg/dL (ref 6–20)
CO2: 27 mmol/L (ref 22–32)
Calcium: 9.1 mg/dL (ref 8.9–10.3)
Chloride: 108 mmol/L (ref 98–111)
Creatinine, Ser: 0.63 mg/dL (ref 0.44–1.00)
GFR, Estimated: >60 mL/min (ref >60)
Glucose, Bld: 113 mg/dL — ABNORMAL HIGH (ref 70–99)
Potassium: 3.4 mmol/L — ABNORMAL LOW (ref 3.5–5.1)
Sodium: 140 mmol/L (ref 135–145)
Total Bilirubin: 0.8 mg/dL (ref 0.3–1.2)
Total Protein: 6.3 g/dL — ABNORMAL LOW (ref 6.5–8.1)

## 2020-12-16 LAB — TROPONIN I (HIGH SENSITIVITY): Troponin I (High Sensitivity): 3 ng/L (ref <18)

## 2020-12-16 NOTE — ED Triage Notes (Signed)
Pt states she was in bed and started feeling flushed and felt like she was having palpitations. Pt does have a hx of anxiety and checked her heart rate which was 106.

## 2021-03-13 ENCOUNTER — Other Ambulatory Visit: Payer: Self-pay

## 2021-03-13 ENCOUNTER — Emergency Department: Payer: Self-pay

## 2021-03-13 DIAGNOSIS — R0789 Other chest pain: Secondary | ICD-10-CM | POA: Insufficient documentation

## 2021-03-13 DIAGNOSIS — Z5321 Procedure and treatment not carried out due to patient leaving prior to being seen by health care provider: Secondary | ICD-10-CM | POA: Insufficient documentation

## 2021-03-13 LAB — BASIC METABOLIC PANEL
Anion gap: 5 (ref 5–15)
BUN: 14 mg/dL (ref 6–20)
CO2: 28 mmol/L (ref 22–32)
Calcium: 9 mg/dL (ref 8.9–10.3)
Chloride: 108 mmol/L (ref 98–111)
Creatinine, Ser: 0.68 mg/dL (ref 0.44–1.00)
GFR, Estimated: >60 mL/min (ref >60)
Glucose, Bld: 99 mg/dL (ref 70–99)
Potassium: 3.6 mmol/L (ref 3.5–5.1)
Sodium: 141 mmol/L (ref 135–145)

## 2021-03-13 LAB — CBC
HCT: 37.3 % (ref 36.0–46.0)
Hemoglobin: 12.7 g/dL (ref 12.0–15.0)
MCH: 29 pg (ref 26.0–34.0)
MCHC: 34 g/dL (ref 30.0–36.0)
MCV: 85.2 fL (ref 80.0–100.0)
Platelets: 202 10*3/uL (ref 150–400)
RBC: 4.38 MIL/uL (ref 3.87–5.11)
RDW: 12 % (ref 11.5–15.5)
WBC: 6.8 10*3/uL (ref 4.0–10.5)
nRBC: 0 % (ref 0.0–0.2)

## 2021-03-13 LAB — TROPONIN I (HIGH SENSITIVITY): Troponin I (High Sensitivity): 6 ng/L (ref <18)

## 2021-03-13 NOTE — ED Triage Notes (Signed)
Pt has been having intermittent chest pain for over a week. Has been monitoring bp. Checked tonight and came in when it was 137-105. Complains of tightness.  Denies nausea.  CP worse with movement.

## 2021-03-14 ENCOUNTER — Emergency Department
Admission: EM | Admit: 2021-03-14 | Discharge: 2021-03-14 | Disposition: A | Discharge status: Home / Self Care | Payer: Self-pay | Attending: Emergency Medicine | Admitting: Emergency Medicine

## 2021-07-21 IMAGING — CR DG CHEST 2V
2 series · 2 of 2 positions shown · non-contrast
Comparison: 05/08/2019

CLINICAL DATA: Chest pain.

EXAM:
CHEST - 2 VIEW

[chest pa]
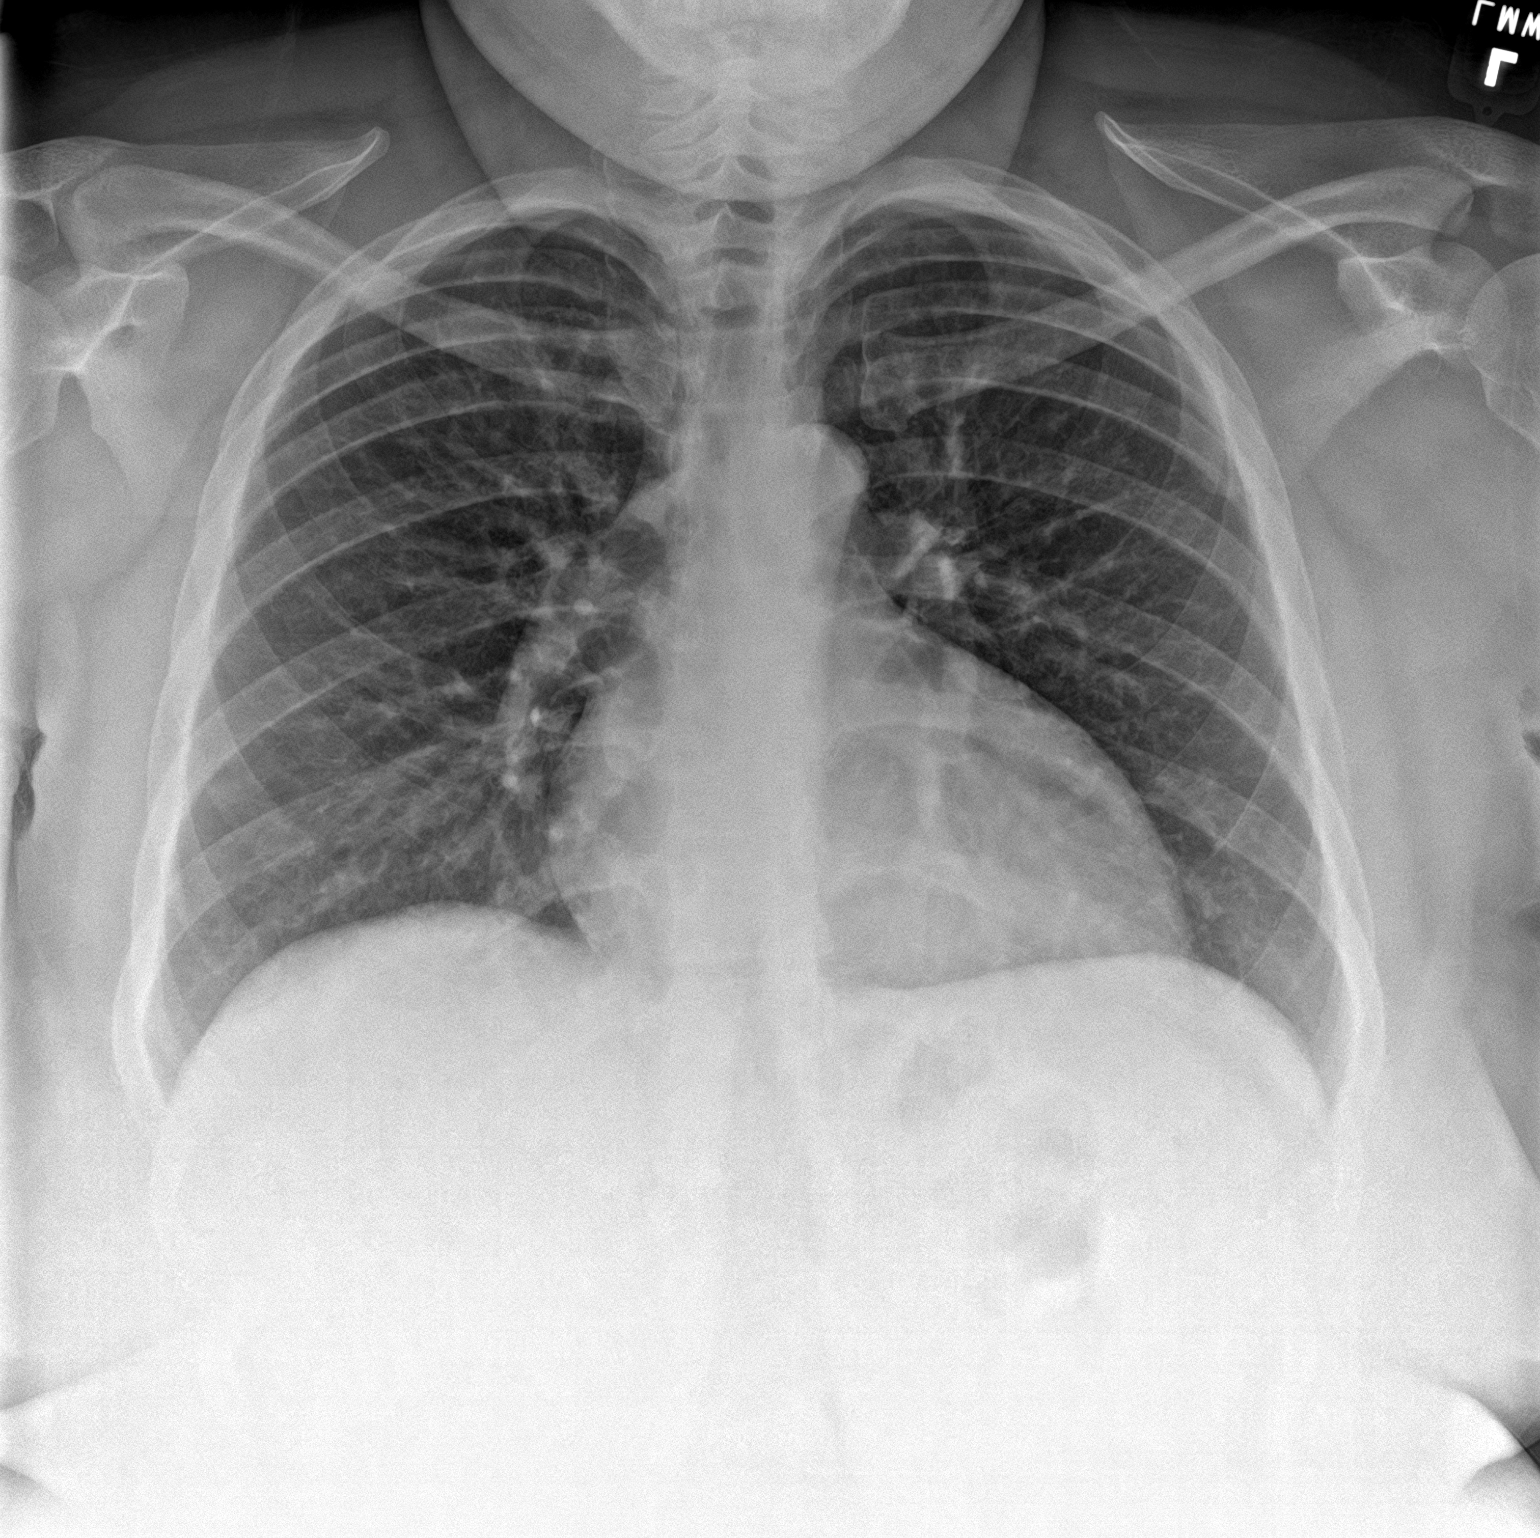

[chest lat]
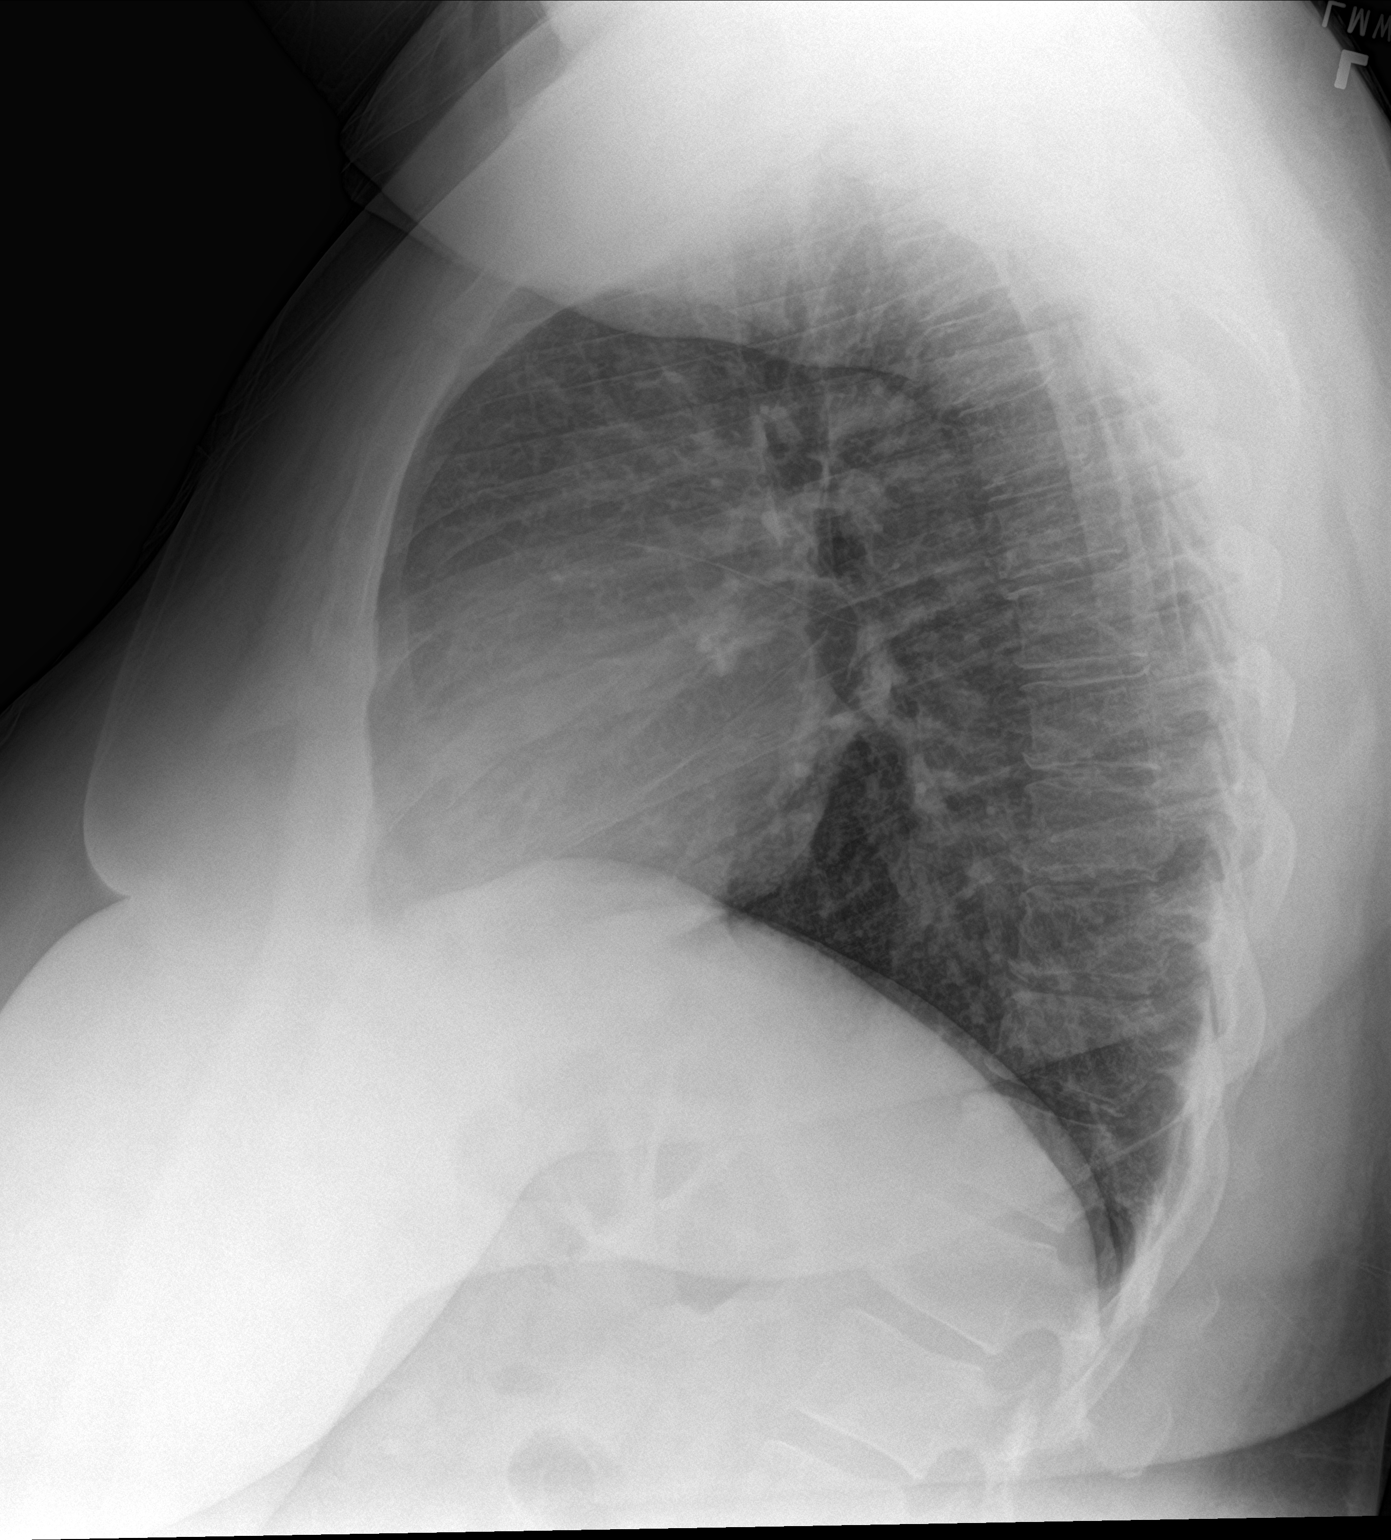

[2 of 2 positions shown; findings below may reference images not displayed]

FINDINGS: The cardiomediastinal contours are normal. The lungs are clear.
Pulmonary vasculature is normal. No consolidation, pleural effusion,
or pneumothorax. No acute osseous abnormalities are seen. Minor
degenerative change in the spine.
IMPRESSION: Negative radiographs of the chest.

## 2021-10-31 ENCOUNTER — Emergency Department
Admission: EM | Admit: 2021-10-31 | Discharge: 2021-10-31 | Disposition: A | Discharge status: Home / Self Care | Payer: Self-pay | Attending: Emergency Medicine | Admitting: Emergency Medicine

## 2021-10-31 ENCOUNTER — Other Ambulatory Visit: Payer: Self-pay

## 2021-10-31 DIAGNOSIS — N3000 Acute cystitis without hematuria: Secondary | ICD-10-CM | POA: Insufficient documentation

## 2021-10-31 LAB — URINALYSIS, ROUTINE W REFLEX MICROSCOPIC
Bilirubin Urine: NEGATIVE
Glucose, UA: NEGATIVE mg/dL
Ketones, ur: NEGATIVE mg/dL
Nitrite: POSITIVE — AB
Protein, ur: NEGATIVE mg/dL
Specific Gravity, Urine: 1.02 (ref 1.005–1.030)
WBC, UA: >50 WBC/hpf — ABNORMAL HIGH (ref 0–5)
pH: 5 (ref 5.0–8.0)

## 2021-10-31 LAB — CBC WITH DIFFERENTIAL/PLATELET
Abs Immature Granulocytes: 0.01 10*3/uL (ref 0.00–0.07)
Basophils Absolute: 0 10*3/uL (ref 0.0–0.1)
Basophils Relative: 1 %
Eosinophils Absolute: 0.2 10*3/uL (ref 0.0–0.5)
Eosinophils Relative: 3 %
HCT: 39.9 % (ref 36.0–46.0)
Hemoglobin: 12.6 g/dL (ref 12.0–15.0)
Immature Granulocytes: 0 %
Lymphocytes Relative: 46 %
Lymphs Abs: 3.4 10*3/uL (ref 0.7–4.0)
MCH: 27.2 pg (ref 26.0–34.0)
MCHC: 31.6 g/dL (ref 30.0–36.0)
MCV: 86 fL (ref 80.0–100.0)
Monocytes Absolute: 0.4 10*3/uL (ref 0.1–1.0)
Monocytes Relative: 6 %
Neutro Abs: 3.2 10*3/uL (ref 1.7–7.7)
Neutrophils Relative %: 44 %
Platelets: 220 10*3/uL (ref 150–400)
RBC: 4.64 MIL/uL (ref 3.87–5.11)
RDW: 12.4 % (ref 11.5–15.5)
WBC: 7.3 10*3/uL (ref 4.0–10.5)
nRBC: 0 % (ref 0.0–0.2)

## 2021-10-31 LAB — COMPREHENSIVE METABOLIC PANEL
ALT: 20 U/L (ref 0–44)
AST: 18 U/L (ref 15–41)
Albumin: 4.3 g/dL (ref 3.5–5.0)
Alkaline Phosphatase: 54 U/L (ref 38–126)
Anion gap: 7 (ref 5–15)
BUN: 13 mg/dL (ref 6–20)
CO2: 25 mmol/L (ref 22–32)
Calcium: 9.2 mg/dL (ref 8.9–10.3)
Chloride: 110 mmol/L (ref 98–111)
Creatinine, Ser: 0.75 mg/dL (ref 0.44–1.00)
GFR, Estimated: >60 mL/min (ref >60)
Glucose, Bld: 90 mg/dL (ref 70–99)
Potassium: 3.3 mmol/L — ABNORMAL LOW (ref 3.5–5.1)
Sodium: 142 mmol/L (ref 135–145)
Total Bilirubin: 0.9 mg/dL (ref 0.3–1.2)
Total Protein: 6.9 g/dL (ref 6.5–8.1)

## 2021-10-31 LAB — PREGNANCY, URINE: Preg Test, Ur: NEGATIVE

## 2021-10-31 MED ORDER — FOSFOMYCIN TROMETHAMINE 3 G PO PACK
3.0000 g | PACK | Freq: Once | ORAL | Status: AC
  Administered 2021-10-31: 3 g via ORAL
  Filled 2021-10-31: qty 3

## 2021-10-31 NOTE — ED Provider Notes (Signed)
[facility] Provider Note  Patient Contact: 9:25 PM (approximate)   History   No chief complaint on file.   HPI  Janet Sheppard is a 36 y.o. female who presents the emergency department complaining of possible UTI.  Patient has burning with urination, frequency.  Patient does have a history of recurrent UTIs and states that symptoms feel similar.  No fevers or chills, flank pain, hematuria.  No vaginal bleeding or discharge.  No GI symptoms.  Patient states that she is allergic to multiple antibiotics and typically receives the 3 g fosfomycin dose.     Physical Exam   Triage Vital Signs: ED Triage Vitals [10/31/21 1851]  Enc Vitals Group     BP (!) 154/97     Pulse Rate 75     Resp 17     Temp 99.1 F (37.3 C)     Temp Source Oral     SpO2 97 %     Weight      Height      Head Circumference      Peak Flow      Pain Score 0     Pain Loc      Pain Edu?      Excl. in GC?     Most recent vital signs: Vitals:   10/31/21 1851  BP: (!) 154/97  Pulse: 75  Resp: 17  Temp: 99.1 F (37.3 C)  SpO2: 97%     General: Alert and in no acute distress.   Cardiovascular:  Good peripheral perfusion Respiratory: Normal respiratory effort without tachypnea or retractions. Lungs CTAB.  Gastrointestinal: Bowel sounds 4 quadrants. Soft and nontender to palpation. No guarding or rigidity. No palpable masses. No distention. No CVA tenderness. Musculoskeletal: Full range of motion to all extremities.  Neurologic:  No gross focal neurologic deficits are appreciated.  Skin:   No rash noted Other:   ED Results / Procedures / Treatments   Labs (all labs ordered are listed, but only abnormal results are displayed) Labs Reviewed  COMPREHENSIVE METABOLIC PANEL - Abnormal; Notable for the following components:      Result Value   Potassium 3.3 (*)    All other components within normal limits  URINALYSIS, ROUTINE W REFLEX MICROSCOPIC - Abnormal; Notable  for the following components:   Color, Urine YELLOW (*)    APPearance CLOUDY (*)    Hgb urine dipstick SMALL (*)    Nitrite POSITIVE (*)    Leukocytes,Ua MODERATE (*)    WBC, UA >50 (*)    Bacteria, UA MANY (*)    All other components within normal limits  URINE CULTURE  CBC WITH DIFFERENTIAL/PLATELET  PREGNANCY, URINE     EKG     RADIOLOGY    No results found.  PROCEDURES:  Critical Care performed: No  Procedures   MEDICATIONS ORDERED IN ED: Medications  fosfomycin (MONUROL) packet 3 g (has no administration in time range)     IMPRESSION / MDM / ASSESSMENT AND PLAN / ED COURSE  I reviewed the triage vital signs and the nursing notes.                              Differential diagnosis includes, but is not limited to, UTI, pyelonephritis, nephrolithiasis, interstitial cystitis   Patient's presentation is most consistent with acute illness / injury with system symptoms.   Patient's diagnosis is consistent with UTI.  Patient presents to  the ED for complaint of dysuria, polyuria x3 days.  No evidence of pyelonephritis.  No concern for nephrolithiasis.  Patient is allergic to multiple antibiotics including penicillins, cephalosporins, sulfa antibiotics.  As such patient typically receives fosfomycin 3 g dose 1 time.  I will order that for the patient to be had here in the emergency department.  Follow-up primary care as needed.  Return precautions discussed with the patient.  Patient is given ED precautions to return to the ED for any worsening or new symptoms.       FINAL CLINICAL IMPRESSION(S) / ED DIAGNOSES   Final diagnoses:  Acute cystitis without hematuria     Rx / DC Orders   ED Discharge Orders     None        Note:  This document was prepared using Dragon voice recognition software and may include unintentional dictation errors.   Lanette Hampshire 10/31/21 2143    Minna Antis, MD 10/31/21 2258

## 2021-10-31 NOTE — ED Notes (Signed)
Called lab to add on urine preg  

## 2021-10-31 NOTE — ED Triage Notes (Addendum)
Pt presents to ED with c/o of possible UTI. Pt reports increased frequency.

## 2021-11-02 LAB — URINE CULTURE: Culture: >=100000 — AB

## 2022-05-11 ENCOUNTER — Emergency Department
Admission: EM | Admit: 2022-05-11 | Discharge: 2022-05-11 | Disposition: A | Discharge status: Home / Self Care | Payer: Self-pay | Attending: Emergency Medicine | Admitting: Emergency Medicine

## 2022-05-11 ENCOUNTER — Emergency Department: Payer: Self-pay

## 2022-05-11 ENCOUNTER — Other Ambulatory Visit: Payer: Self-pay

## 2022-05-11 DIAGNOSIS — J069 Acute upper respiratory infection, unspecified: Secondary | ICD-10-CM | POA: Insufficient documentation

## 2022-05-11 DIAGNOSIS — Z1152 Encounter for screening for COVID-19: Secondary | ICD-10-CM | POA: Insufficient documentation

## 2022-05-11 DIAGNOSIS — B370 Candidal stomatitis: Secondary | ICD-10-CM | POA: Insufficient documentation

## 2022-05-11 DIAGNOSIS — N390 Urinary tract infection, site not specified: Secondary | ICD-10-CM | POA: Insufficient documentation

## 2022-05-11 LAB — RESP PANEL BY RT-PCR (RSV, FLU A&B, COVID)  RVPGX2
Influenza A by PCR: NEGATIVE
Influenza B by PCR: NEGATIVE
Resp Syncytial Virus by PCR: NEGATIVE
SARS Coronavirus 2 by RT PCR: NEGATIVE

## 2022-05-11 MED ORDER — NITROFURANTOIN MONOHYD MACRO 100 MG PO CAPS: 100.0000 mg | ORAL_CAPSULE | Freq: Two times a day (BID) | ORAL | 0 refills | Status: AC

## 2022-05-11 MED ORDER — NYSTATIN 100000 UNIT/ML MT SUSP: 5.0000 mL | Freq: Four times a day (QID) | OROMUCOSAL | 0 refills | Status: AC

## 2022-05-11 NOTE — ED Notes (Addendum)
See triage note. Pt reports tongue burning, vomiting, congestion; also c/o thinking has "bladder infection x72mo"; states frequency, discharge and burning sensation when she urinates. 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 Pt currently in NAD.

## 2022-05-11 NOTE — ED Triage Notes (Signed)
Cough, fever, chills x 2 weeks.  States cough is productive.  AAOx3.  Skin warm and dry. NAD

## 2022-05-11 NOTE — ED Provider Notes (Signed)
[facility] Provider Note    Event Date/Time   First MD Initiated Contact with Patient 05/11/22 0930     (approximate)   History   URI   HPI  Janet Sheppard is a 36 y.o. female who presents with complaints of symptoms of upper respiratory infection, "white stuff "on her tongue" with some burning and discomfort with urination as well.  Not diabetic     Physical Exam   Triage Vital Signs: ED Triage Vitals  Enc Vitals Group     BP 05/11/22 0929 (!) 143/85     Pulse Rate 05/11/22 0929 60     Resp 05/11/22 0929 20     Temp 05/11/22 0929 98.5 F (36.9 C)     Temp Source 05/11/22 0929 Oral     SpO2 05/11/22 0929 94 %     Weight 05/11/22 0907 (!) 152.4 kg (335 lb 15.7 oz)     Height 05/11/22 0907 1.753 m (5\' 9" )     Head Circumference --      Peak Flow --      Pain Score 05/11/22 0907 0     Pain Loc --      Pain Edu? --      Excl. in GC? --     Most recent vital signs: Vitals:   05/11/22 0929 05/11/22 1017  BP: (!) 143/85 (!) 140/93  Pulse: 60 65  Resp: 20 18  Temp: 98.5 F (36.9 C)   SpO2: 94% 95%     General: Awake, no distress.  CV:  Good peripheral perfusion.  Resp:  Normal effort.  Clear to auscultation bilaterally Abd:  No distention.  Other:  ENT: Mild case of thrush on the tongue, no apparent pharyngeal involvement   ED Results / Procedures / Treatments   Labs (all labs ordered are listed, but only abnormal results are displayed) Labs Reviewed  RESP PANEL BY RT-PCR (RSV, FLU A&B, COVID)  [id number]     EKG     RADIOLOGY Chest x-ray viewed interpreted by me, no pneumonia    PROCEDURES:  Critical Care performed:   Procedures   MEDICATIONS ORDERED IN ED: Medications - No data to display   IMPRESSION / MDM / ASSESSMENT AND PLAN / ED COURSE  I reviewed the triage vital signs and the nursing notes. Patient's presentation is most consistent with acute illness / injury with system symptoms.  Patient  presents with upper respiratory symptoms as above, likely viral illness given prevalence of viruses in the area at this time.  Respiratory panel is negative palm, chest x-ray negative for pneumonia.  Clinically patient has a UTI, and also has thrush, will provide prescriptions for these, outpatient follow-up recommended.        FINAL CLINICAL IMPRESSION(S) / ED DIAGNOSES   Final diagnoses:  Upper respiratory tract infection, unspecified type  Thrush  Urinary tract infection without hematuria, site unspecified     Rx / DC Orders   ED Discharge Orders          Ordered    nystatin (MYCOSTATIN) 100000 UNIT/ML suspension  4 times daily        05/11/22 1007    nitrofurantoin, macrocrystal-monohydrate, (MACROBID) 100 MG capsule  2 times daily        05/11/22 1007             Note:  This document was prepared using Dragon voice recognition software and may include unintentional dictation errors.   05/13/22, MD 05/11/22  1602  

## 2022-10-26 ENCOUNTER — Ambulatory Visit: Payer: Medicaid Other | Admitting: Podiatry

## 2022-11-07 ENCOUNTER — Ambulatory Visit: Payer: Medicaid Other | Admitting: Podiatry

## 2022-11-07 DIAGNOSIS — L0889 Other specified local infections of the skin and subcutaneous tissue: Secondary | ICD-10-CM | POA: Diagnosis not present

## 2022-11-07 DIAGNOSIS — B351 Tinea unguium: Secondary | ICD-10-CM

## 2022-11-07 DIAGNOSIS — B353 Tinea pedis: Secondary | ICD-10-CM

## 2022-11-07 DIAGNOSIS — M79674 Pain in right toe(s): Secondary | ICD-10-CM | POA: Diagnosis not present

## 2022-11-07 DIAGNOSIS — M79675 Pain in left toe(s): Secondary | ICD-10-CM

## 2022-11-07 MED ORDER — CLOTRIMAZOLE-BETAMETHASONE 1-0.05 % EX CREA: 1.0000 | TOPICAL_CREAM | Freq: Every day | CUTANEOUS | 3 refills | Status: AC

## 2022-11-07 MED ORDER — TERBINAFINE HCL 250 MG PO TABS: 250.0000 mg | ORAL_TABLET | Freq: Every day | ORAL | 0 refills | Status: AC

## 2022-11-07 NOTE — Progress Notes (Signed)
   Subjective: 37 y.o. female presenting today as a new patient for evaluation of thick discoloration to the toenails that has been ongoing for several years.  She tried different over-the-counter antifungal medications with no improvement.  She also has a history of chronic athlete's foot.  Past Medical History:  Diagnosis Date   Anemia    Bronchitis    History of echocardiogram    a. 12/2019 Echo: EF 60-65%, no rwma. Mildly enlarged RV. Nl valves.   Hypertension    Palpitations    a. 12/2019 Zio: RSR, 92 (54-170). Occas PVCs (1.6%), Rare PACs/vent bigeminy/trigeminy (<1%). Triggered events assoc w/ PVCs and also RSR. Bradycardia and vent bigeminy noted during hours of sleep.    No past surgical history on file.  Allergies  Allergen Reactions   Doxycycline    Amoxicillin-Pot Clavulanate Rash   Cephalexin Rash   Septra [Bactrim] Rash   Sulfamethoxazole-Trimethoprim Rash    B/L feet 11/07/2022  Objective: Physical Exam General: The patient is alert and oriented x3 in no acute distress.  Dermatology: Hyperkeratotic, discolored, thickened, onychodystrophy noted bilateral toenails.  Hyperkeratosis of skin also noted especially in between the digits consistent with a history of chronic tinea pedis.  No open wounds  Vascular: Palpable pedal pulses bilaterally. No edema or erythema noted. Capillary refill within normal limits.  Neurological: Grossly intact via light touch  Musculoskeletal Exam: No pedal deformity noted  Assessment: #1 Onychomycosis of toenails bilateral #2 chronic tinea pedis both  Plan of Care:  -Patient was evaluated. -Mechanical debridement of the bilateral great toenails was performed today using a nail nipper without incident or bleeding. -Today we discussed different treatment options including oral, topical, and laser antifungal treatment modalities.  We discussed their efficacies and side effects.  Patient opts for oral antifungal treatment  modality -Prescription for Lamisil 250 mg #90 daily. Pt denies a history of liver pathology or symptoms. -Prescription for Lotrisone cream apply 2 times daily -OTC Tolcylen antifungal topical dispensed at checkout -Return to clinic 6 months   Felecia Shelling, DPM [facility]  Dr. Felecia Shelling, DPM    [address] [place], [place] [address]                Office [phone]  Fax [phone]

## 2022-11-14 ENCOUNTER — Ambulatory Visit: Payer: Medicaid Other | Admitting: Podiatry

## 2022-12-16 ENCOUNTER — Other Ambulatory Visit: Payer: Self-pay | Admitting: Podiatry

## 2023-01-25 ENCOUNTER — Emergency Department
Admission: EM | Admit: 2023-01-25 | Discharge: 2023-01-25 | Disposition: A | Discharge status: Home / Self Care | Payer: MEDICAID | Attending: Emergency Medicine | Admitting: Emergency Medicine

## 2023-01-25 ENCOUNTER — Other Ambulatory Visit: Payer: Self-pay

## 2023-01-25 ENCOUNTER — Encounter: Payer: Self-pay | Admitting: Emergency Medicine

## 2023-01-25 DIAGNOSIS — N3090 Cystitis, unspecified without hematuria: Secondary | ICD-10-CM | POA: Insufficient documentation

## 2023-01-25 DIAGNOSIS — F17200 Nicotine dependence, unspecified, uncomplicated: Secondary | ICD-10-CM | POA: Diagnosis not present

## 2023-01-25 DIAGNOSIS — I10 Essential (primary) hypertension: Secondary | ICD-10-CM | POA: Diagnosis not present

## 2023-01-25 DIAGNOSIS — R300 Dysuria: Secondary | ICD-10-CM | POA: Diagnosis present

## 2023-01-25 LAB — URINALYSIS, ROUTINE W REFLEX MICROSCOPIC
Bacteria, UA: NONE SEEN
Bilirubin Urine: NEGATIVE
Glucose, UA: NEGATIVE mg/dL
Ketones, ur: NEGATIVE mg/dL
Nitrite: NEGATIVE
Protein, ur: NEGATIVE mg/dL
Specific Gravity, Urine: 1.016 (ref 1.005–1.030)
pH: 6 (ref 5.0–8.0)

## 2023-01-25 LAB — WET PREP, GENITAL
Clue Cells Wet Prep HPF POC: NONE SEEN
Sperm: NONE SEEN
Trich, Wet Prep: NONE SEEN
WBC, Wet Prep HPF POC: <10 (ref <10)
Yeast Wet Prep HPF POC: NONE SEEN

## 2023-01-25 LAB — CHLAMYDIA/NGC RT PCR (ARMC ONLY)
Chlamydia Tr: NOT DETECTED
N gonorrhoeae: NOT DETECTED

## 2023-01-25 LAB — POC URINE PREG, ED: Preg Test, Ur: NEGATIVE

## 2023-01-25 MED ORDER — NITROFURANTOIN MONOHYD MACRO 100 MG PO CAPS
100.0000 mg | ORAL_CAPSULE | Freq: Two times a day (BID) | ORAL | 0 refills | Status: AC
Start: 2023-01-25 — End: 2023-01-30

## 2023-01-25 NOTE — ED Triage Notes (Signed)
Pt reports dysuria with cloudy looking urine for the past week, states that she is also having some cramping with it

## 2023-01-25 NOTE — Discharge Instructions (Signed)
Please take the antibiotics as prescribed for your urinary tract infection.  Please return for any new, worsening, or change in symptoms or other concerns.  It was a pleasure caring for you today.

## 2023-01-25 NOTE — ED Provider Notes (Signed)
[facility] Provider Note    Event Date/Time   First MD Initiated Contact with Patient 01/25/23 1017     (approximate)   History   Dysuria   HPI  Janet Sheppard is a 37 y.o. female with a past medical history of morbid obesity, lymphedema, chronic pain syndrome, candidiasis who presents today for evaluation of burning with urination for the past and she reports that she has had urinary tract infections in the past and this feels similar.  She reports that she is sexually active and recently came off birth control.  She has not noticed any vaginal discharge.  She reports that she has 1 partner only.  No fevers or chills.  No abdominal pain.  Patient Active Problem List   Diagnosis Date Noted   Failure to attend appointment 09/27/2020   Chronic pain syndrome 08/22/2020   Pharmacologic therapy 08/22/2020   Disorder of skeletal system 08/22/2020   Problems influencing health status 08/22/2020   Candidal intertrigo 07/11/2020   Vaginal candidiasis 07/11/2020   Pain of left breast 07/02/2020   PVC's (premature ventricular contractions) 07/02/2020   Snoring 03/10/2020   Anxiety 03/10/2020   Palpitations 12/18/2019   Chest pain of uncertain etiology 12/18/2019   Tobacco use 06/02/2019   Mixed anxiety and depressive disorder 06/26/2018   Stasis dermatitis of both legs 09/15/2014   Essential hypertension 09/03/2014   Knee pain, bilateral 09/03/2014   Morbid obesity (HCC) 02/25/2014   Lymphedema 05/19/2013   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          Physical Exam   Triage Vital Signs: ED Triage Vitals  Encounter Vitals Group     BP 01/25/23 0954 (!) 156/99     Systolic BP Percentile --      Diastolic BP Percentile --      Pulse Rate 01/25/23 0954 71     Resp 01/25/23 0954 16     Temp 01/25/23 0954 98.2 F (36.8 C)     Temp Source 01/25/23 0954 Oral     SpO2 01/25/23 0954 100 %     Weight 01/25/23 0955 (!) 345 lb  (156.5 kg)     Height 01/25/23 0955 5\' 9"  (1.753 m)     Head Circumference --      Peak Flow --      Pain Score 01/25/23 0955 7     Pain Loc --      Pain Education --      Exclude from Growth Chart --     Most recent vital signs: Vitals:   01/25/23 0954  BP: (!) 156/99  Pulse: 71  Resp: 16  Temp: 98.2 F (36.8 C)  SpO2: 100%    Physical Exam Vitals and nursing note reviewed.  Constitutional:      General: Awake and alert. No acute distress.    Appearance: Normal appearance. The patient is obese.  HENT:     Head: Normocephalic and atraumatic.     Mouth: Mucous membranes are moist.  Eyes:     General: PERRL. Normal EOMs        Right eye: No discharge.        Left eye: No discharge.     Conjunctiva/sclera: Conjunctivae normal.  Cardiovascular:     Rate and Rhythm: Normal rate and regular rhythm.     Pulses: Normal pulses.  Pulmonary:     Effort: Pulmonary effort is normal. No respiratory distress.     Breath sounds: Normal breath  sounds.  Abdominal:     Abdomen is soft. There is no abdominal tenderness. No rebound or guarding. No distention.  No CVA tenderness Musculoskeletal:        General: No swelling. Normal range of motion.     Cervical back: Normal range of motion and neck supple.  Skin:    General: Skin is warm and dry.     Capillary Refill: Capillary refill takes less than 2 seconds.     Findings: No rash.  Neurological:     Mental Status: The patient is awake and alert.      ED Results / Procedures / Treatments   Labs (all labs ordered are listed, but only abnormal results are displayed) Labs Reviewed  URINALYSIS, ROUTINE W REFLEX MICROSCOPIC - Abnormal; Notable for the following components:      Result Value   Color, Urine YELLOW (*)    APPearance HAZY (*)    Hgb urine dipstick SMALL (*)    Leukocytes,Ua LARGE (*)    All other components within normal limits  WET PREP, GENITAL  CHLAMYDIA/NGC RT PCR ([facility] ONLY)            POC URINE PREG, ED      EKG     RADIOLOGY     PROCEDURES:  Critical Care performed:   Procedures   MEDICATIONS ORDERED IN ED: Medications - No data to display   IMPRESSION / MDM / ASSESSMENT AND PLAN / ED COURSE  I reviewed the triage vital signs and the nursing notes.   Differential diagnosis includes, but is not limited to, UTI, bacterial vaginosis, STD, candidiasis.  Patient is awake and alert, hemodynamically stable and afebrile.  She has no reproducible abdominal tenderness.  She has no CVA tenderness.  Urinalysis demonstrates leukocytes and rare bacteria, given her symptoms of dysuria, urgency, and frequency will treat as UTI.  She opted to self swab rather than have a pelvic exam, and wet prep was negative.  She does not have concern for gonorrhea chlamydia and did not wish to wait for this result.  These ended up being negative.  She was started on antibiotics for cystitis.  She has no CVA tenderness or fever to suggest pyelonephritis.  We discussed return precautions and outpatient follow-up.  Patient or stands and agrees with plan.  She was discharged in stable condition.   Patient's presentation is most consistent with acute complicated illness / injury requiring diagnostic workup.    FINAL CLINICAL IMPRESSION(S) / ED DIAGNOSES   Final diagnoses:  Cystitis     Rx / DC Orders   ED Discharge Orders          Ordered    nitrofurantoin, macrocrystal-monohydrate, (MACROBID) 100 MG capsule  2 times daily        01/25/23 1115             Note:  This document was prepared using Dragon voice recognition software and may include unintentional dictation errors.   Keturah Shavers 01/25/23 1356    Minna Antis, MD 01/25/23 [phone]

## 2023-04-04 ENCOUNTER — Other Ambulatory Visit: Payer: Self-pay

## 2023-04-04 ENCOUNTER — Encounter: Payer: Self-pay | Admitting: Emergency Medicine

## 2023-04-04 ENCOUNTER — Emergency Department
Admission: EM | Admit: 2023-04-04 | Discharge: 2023-04-04 | Disposition: A | Discharge status: Home / Self Care | Payer: MEDICAID | Attending: Emergency Medicine | Admitting: Emergency Medicine

## 2023-04-04 DIAGNOSIS — N390 Urinary tract infection, site not specified: Secondary | ICD-10-CM | POA: Insufficient documentation

## 2023-04-04 DIAGNOSIS — R350 Frequency of micturition: Secondary | ICD-10-CM | POA: Diagnosis present

## 2023-04-04 DIAGNOSIS — I10 Essential (primary) hypertension: Secondary | ICD-10-CM | POA: Insufficient documentation

## 2023-04-04 LAB — BASIC METABOLIC PANEL
Anion gap: 6 (ref 5–15)
BUN: 13 mg/dL (ref 6–20)
CO2: 26 mmol/L (ref 22–32)
Calcium: 9 mg/dL (ref 8.9–10.3)
Chloride: 106 mmol/L (ref 98–111)
Creatinine, Ser: 0.8 mg/dL (ref 0.44–1.00)
GFR, Estimated: >60 mL/min (ref >60)
Glucose, Bld: 118 mg/dL — ABNORMAL HIGH (ref 70–99)
Potassium: 3.6 mmol/L (ref 3.5–5.1)
Sodium: 138 mmol/L (ref 135–145)

## 2023-04-04 LAB — URINALYSIS, ROUTINE W REFLEX MICROSCOPIC
Bilirubin Urine: NEGATIVE
Glucose, UA: NEGATIVE mg/dL
Ketones, ur: NEGATIVE mg/dL
Nitrite: NEGATIVE
Protein, ur: NEGATIVE mg/dL
Specific Gravity, Urine: 1.015 (ref 1.005–1.030)
WBC, UA: >50 WBC/hpf (ref 0–5)
pH: 6 (ref 5.0–8.0)

## 2023-04-04 LAB — CBC
HCT: 40.6 % (ref 36.0–46.0)
Hemoglobin: 13.3 g/dL (ref 12.0–15.0)
MCH: 28 pg (ref 26.0–34.0)
MCHC: 32.8 g/dL (ref 30.0–36.0)
MCV: 85.5 fL (ref 80.0–100.0)
Platelets: 244 10*3/uL (ref 150–400)
RBC: 4.75 MIL/uL (ref 3.87–5.11)
RDW: 12.5 % (ref 11.5–15.5)
WBC: 6.2 10*3/uL (ref 4.0–10.5)
nRBC: 0 % (ref 0.0–0.2)

## 2023-04-04 LAB — POC URINE PREG, ED: Preg Test, Ur: NEGATIVE

## 2023-04-04 LAB — HCG, QUANTITATIVE, PREGNANCY: hCG, Beta Chain, Quant, S: <1 m[IU]/mL (ref <5)

## 2023-04-04 MED ORDER — NITROFURANTOIN MONOHYD MACRO 100 MG PO CAPS: 100.0000 mg | ORAL_CAPSULE | Freq: Two times a day (BID) | ORAL | 0 refills | Status: AC

## 2023-04-04 NOTE — ED Triage Notes (Signed)
Patient to ED via [facility] for lower back pain and burning with urination. States she is concerned for UTI. States she is also 3 weeks late on her period- took pregnancy test with faint line.

## 2023-04-04 NOTE — ED Provider Notes (Signed)
[facility] Provider Note    Event Date/Time   First MD Initiated Contact with Patient 04/04/23 1143     (approximate)   History   Urinary Frequency   HPI  Janet Sheppard is a 37 y.o. female with PMH of anemia, HTN, obesity and chronic pain syndrome presents for evaluation of dysuria.  Patient endorses burning with urination as well as urinary frequency.  She has some left sided back pain but denies fevers. Patient also states she is 3 weeks late on her cycle and took a pregnancy test at home with a faint line.  She used to use the Depo-Provera shot for birth control, her last 1 was given in May.  She is unsure if she is pregnant or if her period is still irregular after stopping the Depo.      Physical Exam   Triage Vital Signs: ED Triage Vitals  Encounter Vitals Group     BP 04/04/23 1053 (!) 170/101     Systolic BP Percentile --      Diastolic BP Percentile --      Pulse Rate 04/04/23 1053 69     Resp 04/04/23 1053 18     Temp 04/04/23 1053 98.7 F (37.1 C)     Temp Source 04/04/23 1053 Oral     SpO2 04/04/23 1053 100 %     Weight 04/04/23 1054 (!) 340 lb (154.2 kg)     Height 04/04/23 1054 5\' 9"  (1.753 m)     Head Circumference --      Peak Flow --      Pain Score 04/04/23 1054 8     Pain Loc --      Pain Education --      Exclude from Growth Chart --     Most recent vital signs: Vitals:   04/04/23 1053  BP: (!) 170/101  Pulse: 69  Resp: 18  Temp: 98.7 F (37.1 C)  SpO2: 100%    General: Awake, no distress.  CV:  Good peripheral perfusion. RRR. Resp:  Normal effort. CTAB. Abd:  No distention. CVA tenderness on left.  Other:     ED Results / Procedures / Treatments   Labs (all labs ordered are listed, but only abnormal results are displayed) Labs Reviewed  URINALYSIS, ROUTINE W REFLEX MICROSCOPIC - Abnormal; Notable for the following components:      Result Value   Color, Urine YELLOW (*)    APPearance HAZY (*)     Hgb urine dipstick SMALL (*)    Leukocytes,Ua LARGE (*)    Bacteria, UA RARE (*)    All other components within normal limits  BASIC METABOLIC PANEL - Abnormal; Notable for the following components:   Glucose, Bld 118 (*)    All other components within normal limits  CBC  HCG, QUANTITATIVE, PREGNANCY  POC URINE PREG, ED     PROCEDURES:  Critical Care performed: No  Procedures   MEDICATIONS ORDERED IN ED: Medications - No data to display   IMPRESSION / MDM / ASSESSMENT AND PLAN / ED COURSE  I reviewed the triage vital signs and the nursing notes.                             37 year old female who presents for evaluation of dysuria and pregnancy test. Patient was hypertensive in triage, otherwise VSS. NAD on exam.   Differential diagnosis includes, but is not limited  to, UTI, pyelonephritis, nephrolithiasis, muscle strain.  Patient's presentation is most consistent with acute complicated illness / injury requiring diagnostic workup.  BMP and CBC unremarkable. UA notable for leukocytes, >50 WBCs and bacteria. Patient also reports urinary symptoms so I will treat for a UTI.   Urine pregnancy was negative.  I checked a beta hCG as patient reported possible positive pregnancy test at home which was also negative.  Patient is allergic to cephalexin and sulfa drugs so I will treat her with macrobid.  She was given return precautions.  I advised her to follow-up with her PCP in regards to irregular periods and blood pressure.  She voiced understanding, all questions were answered and she is stable at discharge.    FINAL CLINICAL IMPRESSION(S) / ED DIAGNOSES   Final diagnoses:  Urinary tract infection with hematuria, site unspecified     Rx / DC Orders   ED Discharge Orders          Ordered    nitrofurantoin, macrocrystal-monohydrate, (MACROBID) 100 MG capsule  2 times daily        04/04/23 1341             Note:  This document was prepared using Dragon voice  recognition software and may include unintentional dictation errors.   Cameron Ali, PA-C 04/04/23 1342    Phineas Semen, MD 04/04/23 [phone]

## 2023-04-04 NOTE — Discharge Instructions (Signed)
Take the antibiotics as prescribed.  You can take Tylenol or Motrin as needed for pain.  Follow-up with your primary care provider regarding irregular periods and blood pressure.

## 2023-08-28 ENCOUNTER — Other Ambulatory Visit: Payer: Self-pay

## 2023-08-28 ENCOUNTER — Emergency Department
Admission: EM | Admit: 2023-08-28 | Discharge: 2023-08-28 | Disposition: A | Discharge status: Home / Self Care | Payer: Worker's Compensation | Attending: Emergency Medicine | Admitting: Emergency Medicine

## 2023-08-28 DIAGNOSIS — Y990 Civilian activity done for income or pay: Secondary | ICD-10-CM | POA: Insufficient documentation

## 2023-08-28 DIAGNOSIS — S3992XA Unspecified injury of lower back, initial encounter: Secondary | ICD-10-CM | POA: Diagnosis present

## 2023-08-28 DIAGNOSIS — S39012A Strain of muscle, fascia and tendon of lower back, initial encounter: Secondary | ICD-10-CM | POA: Insufficient documentation

## 2023-08-28 DIAGNOSIS — M5431 Sciatica, right side: Secondary | ICD-10-CM | POA: Diagnosis not present

## 2023-08-28 DIAGNOSIS — X500XXA Overexertion from strenuous movement or load, initial encounter: Secondary | ICD-10-CM | POA: Insufficient documentation

## 2023-08-28 MED ORDER — ACETAMINOPHEN 500 MG PO TABS
1000.0000 mg | ORAL_TABLET | Freq: Once | ORAL | Status: AC
  Administered 2023-08-28: 1000 mg via ORAL
  Filled 2023-08-28: qty 2

## 2023-08-28 MED ORDER — LIDOCAINE 5 % EX PTCH
1.0000 | MEDICATED_PATCH | CUTANEOUS | 0 refills | Status: AC
Start: 2023-08-28 — End: 2023-09-07

## 2023-08-28 MED ORDER — METHOCARBAMOL 500 MG PO TABS
1000.0000 mg | ORAL_TABLET | Freq: Once | ORAL | Status: AC
  Administered 2023-08-28: 1000 mg via ORAL
  Filled 2023-08-28: qty 2

## 2023-08-28 MED ORDER — METHOCARBAMOL 500 MG PO TABS: 500.0000 mg | ORAL_TABLET | Freq: Three times a day (TID) | ORAL | 0 refills | Status: AC | PRN

## 2023-08-28 MED ORDER — KETOROLAC TROMETHAMINE 60 MG/2ML IM SOLN
30.0000 mg | Freq: Once | INTRAMUSCULAR | Status: AC
  Administered 2023-08-28: 30 mg via INTRAMUSCULAR
  Filled 2023-08-28: qty 2

## 2023-08-28 MED ORDER — LIDOCAINE 5 % EX PTCH
1.0000 | MEDICATED_PATCH | Freq: Once | CUTANEOUS | Status: DC
  Administered 2023-08-28: 1 via TRANSDERMAL
  Filled 2023-08-28: qty 1

## 2023-08-28 MED ORDER — DEXAMETHASONE 4 MG PO TABS
10.0000 mg | ORAL_TABLET | Freq: Once | ORAL | Status: AC
  Administered 2023-08-28: 10 mg via ORAL
  Filled 2023-08-28: qty 3

## 2023-08-28 NOTE — ED Triage Notes (Addendum)
 Pt to ED via POV from home. 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 Pt reports right lower back pain w/ radiation down right leg. This occurred on Friday.   Pt works at [employer]. This will be workers comp

## 2023-08-28 NOTE — Discharge Instructions (Signed)
 I have sent additional medication to your pharmacy to help with your pain symptoms.  This may be sciatica or lumbar strain.  You can take ibuprofen 600 mg scheduled every 8 hours for the next 5 days and then Tylenol 1000 mg every 6-8 hours as scheduled as well.  Then only take as needed.  Follow-up with your primary care provider as needed in a week if symptoms are continuing

## 2023-08-28 NOTE — ED Provider Notes (Signed)
 [facility] Provider Note    Event Date/Time   First MD Initiated Contact with Patient 08/28/23 1305     (approximate)   History   Back Pain   HPI Keith Cancio is a 38 y.o. female presenting today for acute onset low back pain.  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  Has pain rating down her right leg.  Had taken some IcyHot to the area without significant relief.  Denies any numbness of her lower extremities.  No loss of bowel or bladder.  Still able to walk on it but having pain symptoms.  Denies any trauma to her back.     Physical Exam   Triage Vital Signs: ED Triage Vitals [08/28/23 1254]  Encounter Vitals Group     BP (!) 164/88     Systolic BP Percentile      Diastolic BP Percentile      Pulse Rate 67     Resp 20     Temp 98.2 F (36.8 C)     Temp Source Oral     SpO2 98 %     Weight      Height      Head Circumference      Peak Flow      Pain Score 10     Pain Loc      Pain Education      Exclude from Growth Chart     Most recent vital signs: Vitals:   08/28/23 1254  BP: (!) 164/88  Pulse: 67  Resp: 20  Temp: 98.2 F (36.8 C)  SpO2: 98%   I have reviewed the vital signs. General:  Awake, alert, no acute distress. Head:  Normocephalic, Atraumatic. EENT:  PERRL, EOMI, Oral mucosa pink and moist, Neck is supple. Cardiovascular: Regular rate, 2+ distal pulses. Respiratory:  Normal respiratory effort, symmetrical expansion, no distress.   Extremities: Positive straight leg raise test on the right side with palpable pain on the right upper gluteal region.  Nontender palpation elsewise throughout the right lower extremity.  No L-spine tenderness palpation. Neuro:  Alert and oriented.  Interacting appropriately.   Skin:  Warm, dry, no rash.   Psych: Appropriate affect.    ED Results / Procedures / Treatments   Labs (all labs  ordered are listed, but only abnormal results are displayed) Labs Reviewed - No data to display   EKG    RADIOLOGY    PROCEDURES:  Critical Care performed: No  Procedures   MEDICATIONS ORDERED IN ED: Medications  lidocaine (LIDODERM) 5 % 1 patch (1 patch Transdermal Patch Applied 08/28/23 1323)  ketorolac (TORADOL) injection 30 mg (30 mg Intramuscular Given 08/28/23 1321)  dexamethasone (DECADRON) tablet 10 mg (10 mg Oral Given 08/28/23 1319)  methocarbamol (ROBAXIN) tablet 1,000 mg (1,000 mg Oral Given 08/28/23 1319)  acetaminophen (TYLENOL) tablet 1,000 mg (1,000 mg Oral Given 08/28/23 1319)     IMPRESSION / MDM / ASSESSMENT AND PLAN / ED COURSE  I reviewed the triage vital signs and the nursing notes.                              Differential diagnosis includes, but is not limited to, sciatica, lumbar strain  Patient's presentation is most consistent with acute, uncomplicated illness.  Patient is a 38 year old female presenting  today for acute onset low back pain following pulling sensation.  Exam does appear consistent with either sciatica or lumbar strain given positive straight leg raise test on the right side.  No L-spine tenderness palpation with no indication for imaging at this time.  Was treated symptomatically with Toradol, ibuprofen, Decadron, Robaxin, and Lidoderm patch with some improvement in symptoms.  Able to ambulate without significant difficulty.  Will discharge with regiment of ibuprofen, Tylenol, Robaxin, and Lidoderm.  Told to follow-up with PCP if symptoms do not improve within the next week.  Otherwise given strict return precautions for any numbness or difficulty walking.     FINAL CLINICAL IMPRESSION(S) / ED DIAGNOSES   Final diagnoses:  Sciatica of right side  Strain of lumbar region, initial encounter     Rx / DC Orders   ED Discharge Orders          Ordered    methocarbamol (ROBAXIN) 500 MG tablet  Every 8 hours PRN        08/28/23  1452    lidocaine (LIDODERM) 5 %  Every 24 hours        08/28/23 1452             Note:  This document was prepared using Dragon voice recognition software and may include unintentional dictation errors.   Kandee Orion, MD 08/28/23 [phone]

## 2024-01-05 ENCOUNTER — Other Ambulatory Visit: Payer: Self-pay

## 2024-01-05 ENCOUNTER — Emergency Department
Admission: EM | Admit: 2024-01-05 | Discharge: 2024-01-05 | Disposition: A | Discharge status: Home / Self Care | Payer: Self-pay | Attending: Emergency Medicine | Admitting: Emergency Medicine

## 2024-01-05 ENCOUNTER — Emergency Department: Payer: Self-pay

## 2024-01-05 DIAGNOSIS — I10 Essential (primary) hypertension: Secondary | ICD-10-CM | POA: Diagnosis not present

## 2024-01-05 DIAGNOSIS — U071 COVID-19: Secondary | ICD-10-CM | POA: Diagnosis not present

## 2024-01-05 DIAGNOSIS — R059 Cough, unspecified: Secondary | ICD-10-CM | POA: Diagnosis present

## 2024-01-05 LAB — RESP PANEL BY RT-PCR (RSV, FLU A&B, COVID)  RVPGX2
Influenza A by PCR: NEGATIVE
Influenza B by PCR: NEGATIVE
Resp Syncytial Virus by PCR: NEGATIVE
SARS Coronavirus 2 by RT PCR: POSITIVE — AB

## 2024-01-05 MED ORDER — ONDANSETRON 4 MG PO TBDP: 4.0000 mg | ORAL_TABLET | Freq: Three times a day (TID) | ORAL | 0 refills | Status: AC | PRN

## 2024-01-05 MED ORDER — BENZONATATE 100 MG PO CAPS: 100.0000 mg | ORAL_CAPSULE | Freq: Three times a day (TID) | ORAL | 0 refills | Status: AC | PRN

## 2024-01-05 NOTE — Discharge Instructions (Addendum)
 You were seen in the emergency department today for a nasal congestion.  COVID test is positive.  A viral cough may last up to 2-3 weeks.   Take tylenol  or ibuprofen  for pain or fever as directed.   Stay hydrated by drinking plenty of fluids to thin mucus. Get adequate amount of sleep and avoid overexertion. Consider a humidifier at night. Warm teas and a spoonful of honey may help reduce cough frequency. Follow up with your primary care provider as needed.   For your sore throat use throat lozenges or Chloraseptic spray.  Gargle with warm salt water several times daily

## 2024-01-05 NOTE — ED Triage Notes (Signed)
 Pt to ED for nasal congestion, sore throat for a few days. Sick contacts at work. Took test last night positive for covid.

## 2024-01-05 NOTE — ED Provider Notes (Signed)
 [facility] Emergency Department Provider Note     Event Date/Time   First MD Initiated Contact with Patient 01/05/24 1255     (approximate)   History   Nasal Congestion   HPI  Janet Sheppard is a 38 y.o. female with a history of anemia and HTN and bronchitis presents to the ED with complaint of cough, nasal congestion, sore throat and fatigue x 4 days. 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 Patient took an at home covid test and it was positive. Endorses some nausea without vomiting. Denies chest pain or shortness of breath.       Physical Exam   Triage Vital Signs: ED Triage Vitals  Encounter Vitals Group     BP 01/05/24 1243 (!) 143/92     Girls Systolic BP Percentile --      Girls Diastolic BP Percentile --      Boys Systolic BP Percentile --      Boys Diastolic BP Percentile --      Pulse Rate 01/05/24 1243 72     Resp 01/05/24 1243 18     Temp 01/05/24 1243 98.7 F (37.1 C)     Temp src --      SpO2 01/05/24 1243 100 %     Weight 01/05/24 1244 (!) 340 lb (154.2 kg)     Height 01/05/24 1244 5' 9 (1.753 m)     Head Circumference --      Peak Flow --      Pain Score 01/05/24 1247 5     Pain Loc --      Pain Education --      Exclude from Growth Chart --     Most recent vital signs: Vitals:   01/05/24 1243 01/05/24 1527  BP: (!) 143/92 (!) 143/89  Pulse: 72 73  Resp: 18 18  Temp: 98.7 F (37.1 C)   SpO2: 100% 99%    General: Nontoxic-appearing.  Alert and oriented. INAD.   Head:  NCAT.  Eyes:  PERRLA. EOMI.  Nose:   Mucosa is moist. No rhinorrhea. Throat: Oropharynx clear. No erythema or exudates. Tonsils not enlarged. Uvula is midline. CV:  Good peripheral perfusion. RRR.  RESP:  Normal effort. LCTAB. No retractions.    ED Results / Procedures / Treatments   Labs (all labs ordered are listed, but only abnormal results are displayed) Labs Reviewed   RESP PANEL BY RT-PCR (RSV, FLU A&B, COVID)  RVPGX2 - Abnormal; Notable for the following components:      Result Value   SARS Coronavirus 2 by RT PCR POSITIVE (*)    All other components within normal limits   RADIOLOGY  I personally viewed and evaluated these images as part of my medical decision making, as well as reviewing the written report by the radiologist.  ED Provider Interpretation: No focal consolidation  DG Chest Portable 1 View Result Date: 01/05/2024 EXAM: 1 VIEW XRAY OF THE CHEST 01/05/2024 02:19:49 PM COMPARISON: 05/11/2022 CLINICAL HISTORY: Cough. Pt to ED for nasal congestion, sore throat for a few days. Sick contacts at work. Took test last night positive for covid. FINDINGS: LUNGS AND PLEURA: No focal pulmonary opacity. No pulmonary edema. No pleural effusion. No pneumothorax. HEART AND MEDIASTINUM: The heart is upper normal in size, likely accentuated by portable AP technique. BONES AND SOFT TISSUES: No acute osseous abnormality. IMPRESSION: 1. No acute findings. Electronically signed by: Andrea Gasman MD 01/05/2024  02:57 PM EDT RP Workstation: [id number]    PROCEDURES:  Critical Care performed: No  Procedures   MEDICATIONS ORDERED IN ED: Medications - No data to display   IMPRESSION / MDM / ASSESSMENT AND PLAN / ED COURSE  I reviewed the triage vital signs and the nursing notes.                               38 y.o. female presents to the emergency department for evaluation and treatment of acute cough. See HPI for further details.   Differential diagnosis includes, but is not limited to viral URI, COVID, PNA  Patient's presentation is most consistent with acute complicated illness / injury requiring diagnostic workup.  Patient is alert and oriented.  She is hemodynamically stable and afebrile.  Physical exam findings are as stated above.  Normal lung exam.  Respiratory panel is positive for COVID.  Chest x-ray is reassuring.  Education on symptomatic  treatments provided and patient verbalized understanding.  Discussion of CDC protocols for quarantine and isolation were provided.  Patient stable condition for discharge home.  Encouraged follow-up with primary care provider as needed.  ED return precaution discussed.  FINAL CLINICAL IMPRESSION(S) / ED DIAGNOSES   Final diagnoses:  COVID-19   Rx / DC Orders   ED Discharge Orders          Ordered    ondansetron  (ZOFRAN -ODT) 4 MG disintegrating tablet  Every 8 hours PRN        01/05/24 1429    benzonatate  (TESSALON  PERLES) 100 MG capsule  3 times daily PRN        01/05/24 1429             Note:  This document was prepared using Dragon voice recognition software and may include unintentional dictation errors.    Margrette, Mecca Guitron A, PA-C 01/05/24 1711    Dorothyann Drivers, MD 01/05/24 [phone]

## 2024-02-21 ENCOUNTER — Other Ambulatory Visit: Payer: Self-pay

## 2024-02-21 ENCOUNTER — Encounter: Payer: Self-pay | Admitting: Emergency Medicine

## 2024-02-21 ENCOUNTER — Emergency Department
Admission: EM | Admit: 2024-02-21 | Discharge: 2024-02-21 | Disposition: A | Discharge status: Home / Self Care | Payer: Self-pay | Attending: Emergency Medicine | Admitting: Emergency Medicine

## 2024-02-21 DIAGNOSIS — R300 Dysuria: Secondary | ICD-10-CM | POA: Insufficient documentation

## 2024-02-21 DIAGNOSIS — Z720 Tobacco use: Secondary | ICD-10-CM | POA: Insufficient documentation

## 2024-02-21 DIAGNOSIS — I10 Essential (primary) hypertension: Secondary | ICD-10-CM | POA: Insufficient documentation

## 2024-02-21 LAB — URINALYSIS, ROUTINE W REFLEX MICROSCOPIC
Bilirubin Urine: NEGATIVE
Glucose, UA: NEGATIVE mg/dL
Hgb urine dipstick: NEGATIVE
Ketones, ur: NEGATIVE mg/dL
Leukocytes,Ua: NEGATIVE
Nitrite: NEGATIVE
Protein, ur: NEGATIVE mg/dL
Specific Gravity, Urine: 1.019 (ref 1.005–1.030)
pH: 7 (ref 5.0–8.0)

## 2024-02-21 LAB — WET PREP, GENITAL
Clue Cells Wet Prep HPF POC: NONE SEEN
Sperm: NONE SEEN
Trich, Wet Prep: NONE SEEN
WBC, Wet Prep HPF POC: <10 (ref <10)
Yeast Wet Prep HPF POC: NONE SEEN

## 2024-02-21 LAB — CHLAMYDIA/NGC RT PCR (ARMC ONLY)
Chlamydia Tr: NOT DETECTED
N gonorrhoeae: NOT DETECTED

## 2024-02-21 LAB — PREGNANCY, URINE: Preg Test, Ur: NEGATIVE

## 2024-02-21 NOTE — ED Triage Notes (Signed)
 Pt reports painful urination today and brown discharge.

## 2024-02-21 NOTE — Discharge Instructions (Signed)
 Your urinalysis does not reveal any evidence of infection.  Your wet prep swab was normal.  Your pregnancy test is negative.  You can find the results of the gonorrhea/chlamydia test on MyChart.  If it is positive, you will require further treatment.  Please do not have any intercourse until we have these results.  Please return for any new, worsening, or change in symptoms or other concerns.  It was a pleasure caring for you today.

## 2024-02-21 NOTE — ED Provider Notes (Signed)
 [facility] Provider Note    Event Date/Time   First MD Initiated Contact with Patient 02/21/24 1246     (approximate)   History   Dysuria   HPI  Janet Sheppard is a 38 y.o. female who presents today for dark brown vaginal discharge and suprapubic abdominal cramping.  Patient reports that her menstrual cycle is a couple of days late and so she would like to be tested for pregnancy.  She also wants to be tested for UTI and STDs.  She denies any vaginal discharge otherwise.  Patient Active Problem List   Diagnosis Date Noted   Failure to attend appointment 09/27/2020   Chronic pain syndrome 08/22/2020   Pharmacologic therapy 08/22/2020   Disorder of skeletal system 08/22/2020   Problems influencing health status 08/22/2020   Candidal intertrigo 07/11/2020   Vaginal candidiasis 07/11/2020   Pain of left breast 07/02/2020   PVC's (premature ventricular contractions) 07/02/2020   Snoring 03/10/2020   Anxiety 03/10/2020   Palpitations 12/18/2019   Chest pain of uncertain etiology 12/18/2019   Tobacco use 06/02/2019   Mixed anxiety and depressive disorder 06/26/2018   Stasis dermatitis of both legs 09/15/2014   Essential hypertension 09/03/2014   Knee pain, bilateral 09/03/2014   Morbid obesity (HCC) 02/25/2014   Lymphedema 05/19/2013   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          Physical Exam   Triage Vital Signs: ED Triage Vitals  Encounter Vitals Group     BP 02/21/24 1224 134/84     Girls Systolic BP Percentile --      Girls Diastolic BP Percentile --      Boys Systolic BP Percentile --      Boys Diastolic BP Percentile --      Pulse Rate 02/21/24 1224 64     Resp 02/21/24 1224 16     Temp 02/21/24 1224 (!) 97.5 F (36.4 C)     Temp Source 02/21/24 1224 Oral     SpO2 02/21/24 1224 100 %     Weight 02/21/24 1223 (!) 339 lb 8.1 oz (154 kg)     Height 02/21/24 1223 5' 9 (1.753 m)     Head Circumference --       Peak Flow --      Pain Score 02/21/24 1222 3     Pain Loc --      Pain Education --      Exclude from Growth Chart --     Most recent vital signs: Vitals:   02/21/24 1224 02/21/24 1338  BP: 134/84   Pulse: 64 (!) 59  Resp: 16 16  Temp: (!) 97.5 F (36.4 C) 97.7 F (36.5 C)  SpO2: 100% 100%    Physical Exam Vitals and nursing note reviewed.  Constitutional:      General: Awake and alert. No acute distress.    Appearance: Normal appearance.  HENT:     Head: Normocephalic and atraumatic.     Mouth: Mucous membranes are moist.  Eyes:     General: PERRL. Normal EOMs        Right eye: No discharge.        Left eye: No discharge.     Conjunctiva/sclera: Conjunctivae normal.  Cardiovascular:     Rate and Rhythm: Normal rate and regular rhythm.     Pulses: Normal pulses.  Pulmonary:     Effort: Pulmonary effort is normal. No respiratory distress.     Breath sounds:  Normal breath sounds.  Abdominal:     Abdomen is soft. There is no abdominal tenderness. No rebound or guarding. No distention.  No CVAT Musculoskeletal:        General: No swelling. Normal range of motion.     Cervical back: Normal range of motion and neck supple.  Skin:    General: Skin is warm and dry.     Capillary Refill: Capillary refill takes less than 2 seconds.     Findings: No rash.  Neurological:     Mental Status: The patient is awake and alert.      ED Results / Procedures / Treatments   Labs (all labs ordered are listed, but only abnormal results are displayed) Labs Reviewed  URINALYSIS, ROUTINE W REFLEX MICROSCOPIC - Abnormal; Notable for the following components:      Result Value   Color, Urine YELLOW (*)    APPearance CLEAR (*)    All other components within normal limits  CHLAMYDIA/NGC RT PCR ([facility] ONLY)            WET PREP, GENITAL  PREGNANCY, URINE  POC URINE PREG, ED     EKG     RADIOLOGY     PROCEDURES:  Critical Care performed:    Procedures   MEDICATIONS ORDERED IN ED: Medications - No data to display   IMPRESSION / MDM / ASSESSMENT AND PLAN / ED COURSE  I reviewed the triage vital signs and the nursing notes.   Differential diagnosis includes, but is not limited to, pregnancy, UTI, STD, menses  Patient is awake and alert, hemodynamically stable and afebrile.  She is nontoxic in appearance.  Wet prep is negative for any acute findings, urinalysis is not suggestive of infection, and urine pregnancy is negative.  Patient is reassured by these results.  She did not wish to wait for the results of her gonorrhea/chlamydia testing and understands that she can find this result on MyChart.  It is quite possible that she is experiencing the onset of her menses and the dark discharge is blood as she has also been having bright bleeding.  We discussed return precautions and outpatient follow-up.  Patient or stands and agrees with plan.  She was discharged in stable condition.  I reviewed patient's results after her departure, gonorrhea/chlamydia is negative.  Patient's presentation is most consistent with acute complicated illness / injury requiring diagnostic workup.   Clinical Course as of 02/21/24 1507  Thu Feb 21, 2024  1331 Patient does not wish to wait for the results of her GC/chlamydia and prefers to find them on MyChart [JP]    Clinical Course User Index [JP] Buster Schueller E, PA-C     FINAL CLINICAL IMPRESSION(S) / ED DIAGNOSES   Final diagnoses:  Dysuria     Rx / DC Orders   ED Discharge Orders     None        Note:  This document was prepared using Dragon voice recognition software and may include unintentional dictation errors.   Davie Sagona E, PA-C 02/21/24 1507    Viviann Pastor, MD 02/22/24 [phone]

## 2024-04-18 ENCOUNTER — Other Ambulatory Visit: Payer: Self-pay

## 2024-04-18 ENCOUNTER — Emergency Department: Payer: Self-pay

## 2024-04-18 ENCOUNTER — Emergency Department
Admission: EM | Admit: 2024-04-18 | Discharge: 2024-04-18 | Disposition: A | Discharge status: Home / Self Care | Payer: Self-pay | Attending: Emergency Medicine | Admitting: Emergency Medicine

## 2024-04-18 DIAGNOSIS — R140 Abdominal distension (gaseous): Secondary | ICD-10-CM | POA: Insufficient documentation

## 2024-04-18 DIAGNOSIS — R300 Dysuria: Secondary | ICD-10-CM | POA: Insufficient documentation

## 2024-04-18 DIAGNOSIS — N898 Other specified noninflammatory disorders of vagina: Secondary | ICD-10-CM | POA: Insufficient documentation

## 2024-04-18 DIAGNOSIS — N390 Urinary tract infection, site not specified: Secondary | ICD-10-CM

## 2024-04-18 DIAGNOSIS — Z86001 Personal history of in-situ neoplasm of cervix uteri: Secondary | ICD-10-CM | POA: Insufficient documentation

## 2024-04-18 DIAGNOSIS — R109 Unspecified abdominal pain: Secondary | ICD-10-CM | POA: Insufficient documentation

## 2024-04-18 DIAGNOSIS — Z720 Tobacco use: Secondary | ICD-10-CM | POA: Insufficient documentation

## 2024-04-18 DIAGNOSIS — R102 Pelvic and perineal pain unspecified side: Secondary | ICD-10-CM | POA: Insufficient documentation

## 2024-04-18 DIAGNOSIS — R030 Elevated blood-pressure reading, without diagnosis of hypertension: Secondary | ICD-10-CM

## 2024-04-18 DIAGNOSIS — N946 Dysmenorrhea, unspecified: Secondary | ICD-10-CM

## 2024-04-18 LAB — CBC WITH DIFFERENTIAL/PLATELET
Abs Immature Granulocytes: 0.01 K/uL (ref 0.00–0.07)
Basophils Absolute: 0 K/uL (ref 0.0–0.1)
Basophils Relative: 1 %
Eosinophils Absolute: 0.1 K/uL (ref 0.0–0.5)
Eosinophils Relative: 2 %
HCT: 36.3 % (ref 36.0–46.0)
Hemoglobin: 11.7 g/dL — ABNORMAL LOW (ref 12.0–15.0)
Immature Granulocytes: 0 %
Lymphocytes Relative: 43 %
Lymphs Abs: 2.4 K/uL (ref 0.7–4.0)
MCH: 26.4 pg (ref 26.0–34.0)
MCHC: 32.2 g/dL (ref 30.0–36.0)
MCV: 81.9 fL (ref 80.0–100.0)
Monocytes Absolute: 0.3 K/uL (ref 0.1–1.0)
Monocytes Relative: 6 %
Neutro Abs: 2.6 K/uL (ref 1.7–7.7)
Neutrophils Relative %: 48 %
Platelets: 225 K/uL (ref 150–400)
RBC: 4.43 MIL/uL (ref 3.87–5.11)
RDW: 13.9 % (ref 11.5–15.5)
WBC: 5.5 K/uL (ref 4.0–10.5)
nRBC: 0 % (ref 0.0–0.2)

## 2024-04-18 LAB — URINALYSIS, ROUTINE W REFLEX MICROSCOPIC
Bacteria, UA: NONE SEEN
Bilirubin Urine: NEGATIVE
Glucose, UA: NEGATIVE mg/dL
Hgb urine dipstick: NEGATIVE
Ketones, ur: NEGATIVE mg/dL
Nitrite: NEGATIVE
Protein, ur: NEGATIVE mg/dL
Specific Gravity, Urine: 1.009 (ref 1.005–1.030)
pH: 7 (ref 5.0–8.0)

## 2024-04-18 LAB — POC URINE PREG, ED: Preg Test, Ur: NEGATIVE

## 2024-04-18 LAB — BASIC METABOLIC PANEL WITH GFR
Anion gap: 9 (ref 5–15)
BUN: 8 mg/dL (ref 6–20)
CO2: 25 mmol/L (ref 22–32)
Calcium: 8.3 mg/dL — ABNORMAL LOW (ref 8.9–10.3)
Chloride: 104 mmol/L (ref 98–111)
Creatinine, Ser: 0.73 mg/dL (ref 0.44–1.00)
GFR, Estimated: >60 mL/min (ref >60)
Glucose, Bld: 89 mg/dL (ref 70–99)
Potassium: 3.9 mmol/L (ref 3.5–5.1)
Sodium: 138 mmol/L (ref 135–145)

## 2024-04-18 MED ORDER — NITROFURANTOIN MONOHYD MACRO 100 MG PO CAPS: 100.0000 mg | ORAL_CAPSULE | Freq: Two times a day (BID) | ORAL | 0 refills | Status: AC

## 2024-04-18 NOTE — ED Notes (Signed)
 See triage note  Presents with some dysuria for the past couple of months Denies any fever or n/v

## 2024-04-18 NOTE — Discharge Instructions (Addendum)
 You have been seen in the Emergency Department (ED) today for pain when urinating.  Your workup today suggests that you have a urinary tract infection (UTI). Follow up with OBGYN as listed in this paperwork (Dr. Leonce).  Please take your antibiotic as prescribed and over-the-counter pain medication (Tylenol  or Motrin ) as needed, but no more than recommended on the label instructions.  Drink PLENTY of fluids.  Call your regular doctor to schedule the next available appointment to follow up on today's ED visit, or return immediately to the ED if your pain worsens, you have decreased urine production, develop fever, persistent vomiting, or other symptoms that concern you.     As we discussed, though you do have high blood pressure (hypertension), fortunately it is not immediately dangerous at this time and does not need emergency intervention or admission to the hospital.  If we add to or change your regular medications, we may cause more harm than good - it is more appropriate for your primary care doctor to evaluate you in clinic and decide if any medication changes are needed.  Please follow up in clinic as recommended in these papers.    Return to the Emergency Department (ED) if you experience any worsening chest pain/pressure/tightness, difficulty breathing, or sudden sweating, or other symptoms that concern you.

## 2024-04-18 NOTE — ED Triage Notes (Signed)
 Pt comes in via pov with complaints of a possible UTI. Pt complains of burning with urination. Pt also reports a brown tinted discharge when she wipes. Pt states that she is 2 weeks late with her cycle, but took a pregnancy test a month ago, and it was negative. Pt with a history of hypertension, and took her medications today. Pt with no complaints of pain at this time.

## 2024-04-18 NOTE — ED Provider Notes (Addendum)
  Physical Exam  BP (!) 176/116   Pulse (!) 56   Temp 98.4 F (36.9 C) (Oral)   Resp 18   Ht 5' 9 (1.753 m)   Wt (!) 154.2 kg   LMP 02/15/2024   SpO2 100%   BMI 50.21 kg/m   Physical Exam  Procedures  Procedures  ED Course / MDM    Medical Decision Making Amount and/or Complexity of Data Reviewed Labs: ordered. Radiology: ordered.  Risk Prescription drug management.   Taking over this patient from Jenna Poggi, PA-C. In short, patient here for dysuria and brown vaginal discharge for 2 months as well as abdominal cramping and bloating. Reports she has not had a period in 2 months. She has not used any form of birth control in the past 3 months although she has Depo Provera listed in her chart. Patient seen for similar concern in October 2025 w/ Poggi as well. Has had the same sexual partner since these symptoms started.  2:55 p.m.: UA shows moderate leukocytes. UPT negative. BMP unremarkable. CBC w/ Hgb 11.7. Urine culture pending. Patient declined STD testing and wet prep today. Pelvic US  pending at this time. Patient does not have an OBGYN established. Current plans is to treat UTI w/ antibiotics and follow up with OBGYN pending Pelvic US  results.  3:10 p.m.:  Pelvic US  w/ trace free fluid. No evidence of ovarian cyst or mass. Patient well appearing and afebrile on my examination. BP is 163/103; she is asymptomatic at this time. She has taken her amlodipine  at home today. I will have her f/u w/ her PCP for BP recheck and to see if any medication changes need to be made. Will treat patient for UTI w/ Macrobid  and have her f/u w/ OBGYN outpatient to assess causes of amenorrhea.   Pelvic US  IMPRESSION: 1. Trace nonspecific pelvic free fluid, which may be physiologic. 2. Otherwise age-appropriate pelvic ultrasound.  No acute findings.  The patient may return to the emergency department for any new, worsening, or concerning symptoms. Patient was given the opportunity to ask  questions; all questions were answered. Emergency department return precautions were discussed with the patient.  Patient is in agreement to the treatment plan.  Patient is stable for discharge.    Sheron Salm, PA-C 04/18/24 1552    Sheron Salm, PA-C 04/18/24 1553    Sheron [place], PA-C 04/18/24 1554    Dorothyann Drivers, MD 04/19/24 1850

## 2024-04-18 NOTE — ED Provider Notes (Signed)
 [facility] Provider Note    Event Date/Time   First MD Initiated Contact with Patient 04/18/24 1240     (approximate)   History   Dysuria   HPI  Janet Sheppard is a 38 y.o. female who presents today for evaluation of burning with urination and brown discharge for the past several months.  Patient reports that she also feels abdominal cramping and bloating for the past 2 months.  She denies any new sexual partners since her last visit here at which time she tested negative for gonorrhea and chlamydia.  No nausea or vomiting.  No fevers or chills.  She reports that she typically has abnormal periods, though she took a pregnancy test 2 months ago and this was negative.  Patient Active Problem List   Diagnosis Date Noted   Failure to attend appointment 09/27/2020   Chronic pain syndrome 08/22/2020   Pharmacologic therapy 08/22/2020   Disorder of skeletal system 08/22/2020   Problems influencing health status 08/22/2020   Candidal intertrigo 07/11/2020   Vaginal candidiasis 07/11/2020   Pain of left breast 07/02/2020   PVC's (premature ventricular contractions) 07/02/2020   Snoring 03/10/2020   Anxiety 03/10/2020   Palpitations 12/18/2019   Chest pain of uncertain etiology 12/18/2019   Tobacco use 06/02/2019   Mixed anxiety and depressive disorder 06/26/2018   Stasis dermatitis of both legs 09/15/2014   Essential hypertension 09/03/2014   Knee pain, bilateral 09/03/2014   Morbid obesity (HCC) 02/25/2014   Lymphedema 05/19/2013   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          Physical Exam   Triage Vital Signs: ED Triage Vitals  Encounter Vitals Group     BP 04/18/24 1226 (!) 176/116     Girls Systolic BP Percentile --      Girls Diastolic BP Percentile --      Boys Systolic BP Percentile --      Boys Diastolic BP Percentile --      Pulse Rate 04/18/24 1226 (!) 56     Resp 04/18/24 1226 18     Temp 04/18/24 1226  98.4 F (36.9 C)     Temp Source 04/18/24 1226 Oral     SpO2 04/18/24 1226 100 %     Weight 04/18/24 1226 (!) 340 lb (154.2 kg)     Height 04/18/24 1226 5' 9 (1.753 m)     Head Circumference --      Peak Flow --      Pain Score 04/18/24 1230 0     Pain Loc --      Pain Education --      Exclude from Growth Chart --     Most recent vital signs: Vitals:   04/18/24 1226  BP: (!) 176/116  Pulse: (!) 56  Resp: 18  Temp: 98.4 F (36.9 C)  SpO2: 100%    Physical Exam Vitals and nursing note reviewed.  Constitutional:      General: Awake and alert. No acute distress.    Appearance: Normal appearance.  HENT:     Head: Normocephalic and atraumatic.     Mouth: Mucous membranes are moist.  Eyes:     General: PERRL. Normal EOMs        Right eye: No discharge.        Left eye: No discharge.     Conjunctiva/sclera: Conjunctivae normal.  Cardiovascular:     Rate and Rhythm: Normal rate and regular rhythm.  Pulses: Normal pulses.  Pulmonary:     Effort: Pulmonary effort is normal. No respiratory distress.     Breath sounds: Normal breath sounds.  Abdominal:     Abdomen is soft. There is no abdominal tenderness. No rebound or guarding. No distention. Musculoskeletal:        General: No swelling. Normal range of motion.     Cervical back: Normal range of motion and neck supple.  Skin:    General: Skin is warm and dry.     Capillary Refill: Capillary refill takes less than 2 seconds.     Findings: No rash.  Neurological:     Mental Status: The patient is awake and alert.      ED Results / Procedures / Treatments   Labs (all labs ordered are listed, but only abnormal results are displayed) Labs Reviewed  URINALYSIS, ROUTINE W REFLEX MICROSCOPIC - Abnormal; Notable for the following components:      Result Value   Color, Urine STRAW (*)    APPearance CLEAR (*)    Leukocytes,Ua MODERATE (*)    All other components within normal limits  BASIC METABOLIC PANEL WITH GFR  - Abnormal; Notable for the following components:   Calcium 8.3 (*)    All other components within normal limits  CBC WITH DIFFERENTIAL/PLATELET - Abnormal; Notable for the following components:   Hemoglobin 11.7 (*)    All other components within normal limits  URINE CULTURE  POC URINE PREG, ED     EKG     RADIOLOGY     PROCEDURES:  Critical Care performed:   Procedures   MEDICATIONS ORDERED IN ED: Medications - No data to display   IMPRESSION / MDM / ASSESSMENT AND PLAN / ED COURSE  I reviewed the triage vital signs and the nursing notes.   Differential diagnosis includes, but is not limited to, pregnancy, ovarian cyst, fibroids, UTI.  I reviewed the patient's chart.  Patient came in October with the same complaint at which time she had negative GC/chlamydia and wet prep.  Patient declined STD testing today including GC/chlamydia and repeat wet prep.  Urinalysis obtained does reveal leukocytes, this was sent for culture.  Will treat for UTI with antibiotics.  Labs obtained are overall reassuring.  Given her abdominal bloating sensation, will obtain ultrasound for evaluation of fibroids or other abnormality.  Patient passed off to oncoming provider S. Dunlap PA-C pending ultrasound results and final disposition.   Patient's presentation is most consistent with acute complicated illness / injury requiring diagnostic workup.      FINAL CLINICAL IMPRESSION(S) / ED DIAGNOSES   Final diagnoses:  Pelvic pain  Dysuria     Rx / DC Orders   ED Discharge Orders          Ordered    nitrofurantoin , macrocrystal-monohydrate, (MACROBID ) 100 MG capsule  2 times daily        04/18/24 1441             Note:  This document was prepared using Dragon voice recognition software and may include unintentional dictation errors.   Aimi Essner E, PA-C 04/18/24 1446    Dorothyann Drivers, MD 04/18/24 [phone]
